# Patient Record
Sex: Male | Born: 1955 | Race: White | Hispanic: No | Marital: Married | Smoking: Never smoker
Health system: Southern US, Community
[De-identification: ages and names within clinical notes are randomized; demographics above are authoritative.]

## PROBLEM LIST (undated history)

## (undated) DIAGNOSIS — IMO0002 Reserved for concepts with insufficient information to code with codable children: Secondary | ICD-10-CM

## (undated) DIAGNOSIS — E785 Hyperlipidemia, unspecified: Secondary | ICD-10-CM

## (undated) DIAGNOSIS — E669 Obesity, unspecified: Secondary | ICD-10-CM

## (undated) DIAGNOSIS — G4730 Sleep apnea, unspecified: Secondary | ICD-10-CM

## (undated) DIAGNOSIS — E119 Type 2 diabetes mellitus without complications: Secondary | ICD-10-CM

## (undated) DIAGNOSIS — G4733 Obstructive sleep apnea (adult) (pediatric): Secondary | ICD-10-CM

## (undated) DIAGNOSIS — T7840XA Allergy, unspecified, initial encounter: Secondary | ICD-10-CM

## (undated) DIAGNOSIS — J45909 Unspecified asthma, uncomplicated: Secondary | ICD-10-CM

## (undated) DIAGNOSIS — M1990 Unspecified osteoarthritis, unspecified site: Secondary | ICD-10-CM

## (undated) DIAGNOSIS — I10 Essential (primary) hypertension: Secondary | ICD-10-CM

## (undated) HISTORY — DX: Allergy, unspecified, initial encounter: T78.40XA

## (undated) HISTORY — PX: POLYPECTOMY: SHX149

## (undated) HISTORY — DX: Obstructive sleep apnea (adult) (pediatric): G47.33

## (undated) HISTORY — DX: Sleep apnea, unspecified: G47.30

## (undated) HISTORY — PX: COLONOSCOPY: SHX174

## (undated) HISTORY — DX: Essential (primary) hypertension: I10

## (undated) HISTORY — DX: Unspecified asthma, uncomplicated: J45.909

## (undated) HISTORY — DX: Hyperlipidemia, unspecified: E78.5

## (undated) HISTORY — DX: Reserved for concepts with insufficient information to code with codable children: IMO0002

## (undated) HISTORY — DX: Obesity, unspecified: E66.9

---

## 1988-02-28 HISTORY — PX: SHOULDER SURGERY: SHX246

## 1999-02-01 ENCOUNTER — Ambulatory Visit (HOSPITAL_COMMUNITY): Admission: RE | Admit: 1999-02-01 | Discharge: 1999-02-01 | Payer: Self-pay | Admitting: Gastroenterology

## 2003-05-14 ENCOUNTER — Emergency Department (HOSPITAL_COMMUNITY): Admission: EM | Admit: 2003-05-14 | Discharge: 2003-05-14 | Payer: Self-pay | Admitting: Emergency Medicine

## 2005-10-25 ENCOUNTER — Ambulatory Visit: Payer: Self-pay | Admitting: Internal Medicine

## 2005-11-01 ENCOUNTER — Ambulatory Visit: Payer: Self-pay | Admitting: Internal Medicine

## 2005-11-29 ENCOUNTER — Ambulatory Visit: Payer: Self-pay | Admitting: Internal Medicine

## 2005-11-29 DIAGNOSIS — I10 Essential (primary) hypertension: Secondary | ICD-10-CM

## 2005-11-29 HISTORY — DX: Essential (primary) hypertension: I10

## 2006-03-07 ENCOUNTER — Encounter: Payer: Self-pay | Admitting: Internal Medicine

## 2006-03-07 ENCOUNTER — Ambulatory Visit: Payer: Self-pay | Admitting: Internal Medicine

## 2006-07-06 ENCOUNTER — Ambulatory Visit: Payer: Self-pay | Admitting: Internal Medicine

## 2006-08-15 ENCOUNTER — Ambulatory Visit: Payer: Self-pay | Admitting: Internal Medicine

## 2006-08-15 LAB — CONVERTED CEMR LAB
ALT: 26 units/L (ref 0–40)
AST: 27 units/L (ref 0–37)
Albumin: 4.6 g/dL (ref 3.5–5.2)
Alkaline Phosphatase: 33 units/L — ABNORMAL LOW (ref 39–117)
BUN: 24 mg/dL — ABNORMAL HIGH (ref 6–23)
Bilirubin, Direct: 0.1 mg/dL (ref 0.0–0.3)
CO2: 34 meq/L — ABNORMAL HIGH (ref 19–32)
Calcium: 9.8 mg/dL (ref 8.4–10.5)
Chloride: 105 meq/L (ref 96–112)
Creatinine, Ser: 1.1 mg/dL (ref 0.4–1.5)
GFR calc Af Amer: 91 mL/min
GFR calc non Af Amer: 75 mL/min
Glucose, Bld: 82 mg/dL (ref 70–99)
Potassium: 4.7 meq/L (ref 3.5–5.1)
Sodium: 146 meq/L — ABNORMAL HIGH (ref 135–145)
Total Bilirubin: 0.7 mg/dL (ref 0.3–1.2)
Total Protein: 6.8 g/dL (ref 6.0–8.3)

## 2006-10-09 ENCOUNTER — Telehealth (INDEPENDENT_AMBULATORY_CARE_PROVIDER_SITE_OTHER): Payer: Self-pay | Admitting: *Deleted

## 2006-11-14 ENCOUNTER — Ambulatory Visit: Payer: Self-pay | Admitting: Internal Medicine

## 2006-11-15 ENCOUNTER — Encounter: Payer: Self-pay | Admitting: Internal Medicine

## 2007-02-07 ENCOUNTER — Ambulatory Visit: Payer: Self-pay | Admitting: Internal Medicine

## 2007-02-07 DIAGNOSIS — E785 Hyperlipidemia, unspecified: Secondary | ICD-10-CM

## 2007-02-07 HISTORY — DX: Hyperlipidemia, unspecified: E78.5

## 2007-02-07 LAB — CONVERTED CEMR LAB
ALT: 36 units/L (ref 0–53)
AST: 40 units/L — ABNORMAL HIGH (ref 0–37)
Albumin: 3.5 g/dL (ref 3.5–5.2)
Alkaline Phosphatase: 38 units/L — ABNORMAL LOW (ref 39–117)
BUN: 16 mg/dL (ref 6–23)
Basophils Absolute: 0 10*3/uL (ref 0.0–0.1)
Basophils Relative: 0.2 % (ref 0.0–1.0)
Bilirubin, Direct: 0.1 mg/dL (ref 0.0–0.3)
CO2: 33 meq/L — ABNORMAL HIGH (ref 19–32)
Calcium: 9.3 mg/dL (ref 8.4–10.5)
Chloride: 102 meq/L (ref 96–112)
Cholesterol: 208 mg/dL (ref 0–200)
Creatinine, Ser: 0.9 mg/dL (ref 0.4–1.5)
Direct LDL: 103.5 mg/dL
Eosinophils Absolute: 0.6 10*3/uL (ref 0.0–0.6)
Eosinophils Relative: 6.6 % — ABNORMAL HIGH (ref 0.0–5.0)
GFR calc Af Amer: 115 mL/min
GFR calc non Af Amer: 95 mL/min
Glucose, Bld: 100 mg/dL — ABNORMAL HIGH (ref 70–99)
HCT: 42 % (ref 39.0–52.0)
HDL: 42.7 mg/dL (ref >39.0)
Hemoglobin: 14.4 g/dL (ref 13.0–17.0)
Lymphocytes Relative: 33.7 % (ref 12.0–46.0)
MCHC: 34.4 g/dL (ref 30.0–36.0)
MCV: 92.8 fL (ref 78.0–100.0)
Monocytes Absolute: 0.8 10*3/uL — ABNORMAL HIGH (ref 0.2–0.7)
Monocytes Relative: 9.3 % (ref 3.0–11.0)
Neutro Abs: 4.3 10*3/uL (ref 1.4–7.7)
Neutrophils Relative %: 50.2 % (ref 43.0–77.0)
Platelets: 229 10*3/uL (ref 150–400)
Potassium: 4.7 meq/L (ref 3.5–5.1)
RBC: 4.53 M/uL (ref 4.22–5.81)
RDW: 12 % (ref 11.5–14.6)
Sodium: 142 meq/L (ref 135–145)
Total Bilirubin: 0.7 mg/dL (ref 0.3–1.2)
Total CHOL/HDL Ratio: 4.9
Total Protein: 6.7 g/dL (ref 6.0–8.3)
Triglycerides: 199 mg/dL — ABNORMAL HIGH (ref 0–149)
VLDL: 40 mg/dL (ref 0–40)
WBC: 8.6 10*3/uL (ref 4.5–10.5)

## 2007-02-14 ENCOUNTER — Ambulatory Visit: Payer: Self-pay | Admitting: Internal Medicine

## 2007-04-18 ENCOUNTER — Telehealth: Payer: Self-pay | Admitting: Internal Medicine

## 2007-05-21 ENCOUNTER — Ambulatory Visit: Payer: Self-pay | Admitting: Internal Medicine

## 2007-05-21 LAB — CONVERTED CEMR LAB
Albumin: 4.1 g/dL (ref 3.5–5.2)
BUN: 20 mg/dL (ref 6–23)
Bilirubin, Direct: 0.1 mg/dL (ref 0.0–0.3)
Calcium: 9.6 mg/dL (ref 8.4–10.5)
GFR calc Af Amer: 102 mL/min
Glucose, Bld: 105 mg/dL — ABNORMAL HIGH (ref 70–99)
HDL: 35 mg/dL — ABNORMAL LOW (ref >39.0)
Sodium: 140 meq/L (ref 135–145)
Total Protein: 6.5 g/dL (ref 6.0–8.3)
Triglycerides: 457 mg/dL (ref 0–149)

## 2007-06-11 ENCOUNTER — Ambulatory Visit: Payer: Self-pay | Admitting: Internal Medicine

## 2007-06-17 ENCOUNTER — Ambulatory Visit: Payer: Self-pay | Admitting: Internal Medicine

## 2007-08-09 ENCOUNTER — Ambulatory Visit: Payer: Self-pay | Admitting: Internal Medicine

## 2007-10-01 ENCOUNTER — Ambulatory Visit: Payer: Self-pay | Admitting: Internal Medicine

## 2007-10-01 DIAGNOSIS — E669 Obesity, unspecified: Secondary | ICD-10-CM | POA: Insufficient documentation

## 2007-10-01 DIAGNOSIS — G4733 Obstructive sleep apnea (adult) (pediatric): Secondary | ICD-10-CM

## 2007-10-01 HISTORY — DX: Obstructive sleep apnea (adult) (pediatric): G47.33

## 2007-10-01 HISTORY — DX: Obesity, unspecified: E66.9

## 2007-10-02 ENCOUNTER — Encounter: Payer: Self-pay | Admitting: Internal Medicine

## 2007-10-03 LAB — CONVERTED CEMR LAB
ALT: 28 units/L (ref 0–53)
BUN: 18 mg/dL (ref 6–23)
CO2: 32 meq/L (ref 19–32)
Calcium: 9.7 mg/dL (ref 8.4–10.5)
Cholesterol: 229 mg/dL (ref 0–200)
Creatinine, Ser: 1 mg/dL (ref 0.4–1.5)
Direct LDL: 89.5 mg/dL
Glucose, Bld: 102 mg/dL — ABNORMAL HIGH (ref 70–99)
Total Protein: 7 g/dL (ref 6.0–8.3)
Triglycerides: 568 mg/dL (ref 0–149)

## 2008-01-30 ENCOUNTER — Ambulatory Visit: Payer: Self-pay | Admitting: Internal Medicine

## 2008-02-03 LAB — CONVERTED CEMR LAB
BUN: 22 mg/dL (ref 6–23)
CO2: 32 meq/L (ref 19–32)
Chloride: 105 meq/L (ref 96–112)
Cholesterol: 220 mg/dL (ref 0–200)
Creatinine, Ser: 1 mg/dL (ref 0.4–1.5)
Direct LDL: 101.9 mg/dL

## 2008-02-18 ENCOUNTER — Encounter: Payer: Self-pay | Admitting: Internal Medicine

## 2008-07-28 ENCOUNTER — Telehealth: Payer: Self-pay | Admitting: Internal Medicine

## 2008-07-31 ENCOUNTER — Encounter: Payer: Self-pay | Admitting: Internal Medicine

## 2008-08-14 ENCOUNTER — Ambulatory Visit: Payer: Self-pay | Admitting: Internal Medicine

## 2008-08-18 LAB — CONVERTED CEMR LAB
Bilirubin, Direct: 0 mg/dL (ref 0.0–0.3)
Direct LDL: 90.6 mg/dL
GFR calc non Af Amer: 94.15 mL/min (ref >60)
Glucose, Bld: 108 mg/dL — ABNORMAL HIGH (ref 70–99)
HDL: 37.8 mg/dL — ABNORMAL LOW (ref >39.00)
Potassium: 3.7 meq/L (ref 3.5–5.1)
Sodium: 142 meq/L (ref 135–145)
TSH: 0.99 microintl units/mL (ref 0.35–5.50)
Total Protein: 6.9 g/dL (ref 6.0–8.3)
Triglycerides: 235 mg/dL — ABNORMAL HIGH (ref 0.0–149.0)
VLDL: 47 mg/dL — ABNORMAL HIGH (ref 0.0–40.0)

## 2008-09-01 ENCOUNTER — Ambulatory Visit: Payer: Self-pay | Admitting: Family Medicine

## 2008-09-02 ENCOUNTER — Telehealth: Payer: Self-pay | Admitting: Family Medicine

## 2008-11-04 ENCOUNTER — Ambulatory Visit: Payer: Self-pay | Admitting: Family Medicine

## 2008-11-05 ENCOUNTER — Telehealth (INDEPENDENT_AMBULATORY_CARE_PROVIDER_SITE_OTHER): Payer: Self-pay | Admitting: *Deleted

## 2008-11-30 ENCOUNTER — Ambulatory Visit: Payer: Self-pay | Admitting: Family Medicine

## 2008-12-06 ENCOUNTER — Emergency Department (HOSPITAL_BASED_OUTPATIENT_CLINIC_OR_DEPARTMENT_OTHER): Admission: EM | Admit: 2008-12-06 | Discharge: 2008-12-06 | Payer: Self-pay | Admitting: Emergency Medicine

## 2009-01-13 ENCOUNTER — Encounter (INDEPENDENT_AMBULATORY_CARE_PROVIDER_SITE_OTHER): Payer: Self-pay | Admitting: *Deleted

## 2009-03-01 ENCOUNTER — Ambulatory Visit: Payer: Self-pay | Admitting: Family Medicine

## 2009-05-10 ENCOUNTER — Ambulatory Visit: Payer: Self-pay | Admitting: Family Medicine

## 2009-05-12 ENCOUNTER — Ambulatory Visit: Payer: Self-pay | Admitting: Family Medicine

## 2009-06-08 ENCOUNTER — Ambulatory Visit: Payer: Self-pay | Admitting: Internal Medicine

## 2009-06-10 LAB — CONVERTED CEMR LAB
Alkaline Phosphatase: 36 units/L — ABNORMAL LOW (ref 39–117)
BUN: 19 mg/dL (ref 6–23)
Basophils Absolute: 0 10*3/uL (ref 0.0–0.1)
Bilirubin, Direct: 0 mg/dL (ref 0.0–0.3)
CO2: 34 meq/L — ABNORMAL HIGH (ref 19–32)
Calcium: 9.2 mg/dL (ref 8.4–10.5)
Chloride: 102 meq/L (ref 96–112)
Cholesterol: 187 mg/dL (ref 0–200)
Creatinine, Ser: 1 mg/dL (ref 0.4–1.5)
Eosinophils Absolute: 0.3 10*3/uL (ref 0.0–0.7)
Glucose, Bld: 90 mg/dL (ref 70–99)
HDL: 56.8 mg/dL (ref >39.00)
Lymphocytes Relative: 38.4 % (ref 12.0–46.0)
MCHC: 34.5 g/dL (ref 30.0–36.0)
MCV: 94.4 fL (ref 78.0–100.0)
Monocytes Absolute: 0.5 10*3/uL (ref 0.1–1.0)
Neutrophils Relative %: 49.7 % (ref 43.0–77.0)
PSA: 1.04 ng/mL (ref 0.10–4.00)
Platelets: 164 10*3/uL (ref 150.0–400.0)
RDW: 14 % (ref 11.5–14.6)
Total CHOL/HDL Ratio: 3
Triglycerides: 150 mg/dL — ABNORMAL HIGH (ref 0.0–149.0)

## 2009-06-16 ENCOUNTER — Encounter: Payer: Self-pay | Admitting: Internal Medicine

## 2009-06-21 ENCOUNTER — Telehealth: Payer: Self-pay | Admitting: Internal Medicine

## 2009-06-24 ENCOUNTER — Encounter: Payer: Self-pay | Admitting: Internal Medicine

## 2009-07-02 ENCOUNTER — Encounter: Payer: Self-pay | Admitting: Internal Medicine

## 2009-07-08 ENCOUNTER — Encounter: Payer: Self-pay | Admitting: Internal Medicine

## 2009-07-15 ENCOUNTER — Encounter: Payer: Self-pay | Admitting: Internal Medicine

## 2009-07-29 ENCOUNTER — Encounter: Payer: Self-pay | Admitting: Internal Medicine

## 2009-08-05 ENCOUNTER — Encounter: Payer: Self-pay | Admitting: Internal Medicine

## 2009-08-25 ENCOUNTER — Encounter: Payer: Self-pay | Admitting: Internal Medicine

## 2009-09-07 ENCOUNTER — Encounter: Payer: Self-pay | Admitting: Internal Medicine

## 2009-09-07 ENCOUNTER — Ambulatory Visit: Payer: Self-pay | Admitting: Internal Medicine

## 2009-09-07 DIAGNOSIS — J45909 Unspecified asthma, uncomplicated: Secondary | ICD-10-CM

## 2009-09-07 HISTORY — DX: Unspecified asthma, uncomplicated: J45.909

## 2009-09-29 ENCOUNTER — Ambulatory Visit: Payer: Self-pay | Admitting: Family Medicine

## 2009-09-29 ENCOUNTER — Telehealth: Payer: Self-pay | Admitting: Internal Medicine

## 2009-09-30 ENCOUNTER — Telehealth: Payer: Self-pay | Admitting: Internal Medicine

## 2009-10-04 ENCOUNTER — Encounter: Payer: Self-pay | Admitting: Internal Medicine

## 2009-10-12 ENCOUNTER — Ambulatory Visit: Payer: Self-pay | Admitting: Internal Medicine

## 2009-10-28 ENCOUNTER — Encounter: Payer: Self-pay | Admitting: Internal Medicine

## 2009-11-17 ENCOUNTER — Encounter: Payer: Self-pay | Admitting: Internal Medicine

## 2009-12-20 ENCOUNTER — Ambulatory Visit: Payer: Self-pay | Admitting: Internal Medicine

## 2010-01-18 ENCOUNTER — Ambulatory Visit: Payer: Self-pay | Admitting: Internal Medicine

## 2010-03-02 ENCOUNTER — Ambulatory Visit
Admission: RE | Admit: 2010-03-02 | Discharge: 2010-03-02 | Payer: Self-pay | Source: Home / Self Care | Attending: Family Medicine | Admitting: Family Medicine

## 2010-03-29 NOTE — Consult Note (Signed)
Summary: [facility]  [facility]   Imported By: Maryln Gottron 07/07/2009 12:17:21  _____________________________________________________________________  External Attachment:    Type:   Image     Comment:   External Document

## 2010-03-29 NOTE — Assessment & Plan Note (Signed)
Summary: CPX/PT WILL COME IN FASTING/[facility] rsc bmp   Vital Signs:  Patient profile:   55 year old male Height:      72 inches Weight:      321 pounds BMI:     43.69 Pulse rate:   72 / minute Pulse rhythm:   regular Resp:     14 per minute BP sitting:   142 / 96  (left arm) Cuff size:   large  Vitals Entered By: Gladis Riffle, RN (June 08, 2009 9:06 AM)  Nutrition Counseling: Patient's BMI is greater than 25 and therefore counseled on weight management options.  Serial Vital Signs/Assessments:  Time      Position  BP       Pulse  Resp  Temp     By                     140/90                         [name] MD  CC: cpx, fasting except lozenge at 7AM Is Patient Diabetic? No   CC:  cpx and fasting except lozenge at 7AM.  History of Present Illness: cpx  weight---not following diet or exercise plan  Preventive Screening-Counseling & Management  Alcohol-Tobacco     Smoking Status: never  Current Problems (verified): 1)  Sleep Apnea, Obstructive  (ICD-327.23) 2)  Obesity  (ICD-278.00) 3)  Hyperlipidemia  (ICD-272.4) 4)  Anemia  (ICD-285.9) 5)  Hypertension  (ICD-401.9)  Current Medications (verified): 1)  Lisinopril-Hydrochlorothiazide 20-12.5 Mg  Tabs (Lisinopril-Hydrochlorothiazide) .... 2 By Mouth Once Daily 2)  Amlodipine Besylate 10 Mg  Tabs (Amlodipine Besylate) .... Once Daily 3)  Vicodin Es 7.5-750 Mg  Tabs (Hydrocodone-Acetaminophen) .Marland Kitchen.. 1 Every Four Hours As Needed 4)  Naproxen Dr 375 Mg  Tbec (Naproxen) .... Take 1 Tablet By Mouth Two Times A Day 5)  Multivitamins   Tabs (Multiple Vitamin) .... Once Daily 6)  Lidoderm 5 %  Ptch (Lidocaine) .... As Needed 7)  Carvedilol 25 Mg  Tabs (Carvedilol) .Marland Kitchen.. 1 Pill By Mouth 2 Times Daily 8)  Vitamin C 1000 Mg Tabs (Ascorbic Acid) .... Once Daily 9)  Proair Hfa 108 (90 Base) Mcg/act  Aers (Albuterol Sulfate) .... 2 Puffs 4 Times Daily As Needed For Wheezing  Allergies: 1)  ! Penicillin V Potassium (Penicillin V  Potassium) 2)  ! Adult Aspirin Low Strength (Aspirin)  Past History:  Past Medical History: Last updated: 11/29/2005 Hypertension chronic back pain obesity  Past Surgical History: Last updated: 11/29/2005 shoulder surgery  Family History: Last updated: 06/08/2009 Family History of CAD Male 1st degree relative <50 Family History Diabetes 1st degree relative brother colon CA age 14 father 24s colon CA/lung CA  Social History: Last updated: 11/14/2006 Married Never Smoked long term disability  Risk Factors: Smoking Status: never (06/08/2009)  Family History: Family History of CAD Male 1st degree relative <50 Family History Diabetes 1st degree relative brother colon CA age 76 father 34s colon CA/lung CA  Physical Exam  General:  alert and well-developed.   Head:  normocephalic and atraumatic.   Eyes:  pupils equal and pupils round.   Ears:  R ear normal and L ear normal.   Nose:  no external deformity and no external erythema.   Neck:  No deformities, masses, or tenderness noted. Lungs:  normal respiratory effort and no intercostal retractions.   Heart:  normal rate and regular rhythm.  Abdomen:  soft and non-tender.   Prostate:  no nodules and no asymmetry.   Msk:  No deformity or scoliosis noted of thoracic or lumbar spine.   Pulses:  R radial normal and L radial normal.   Extremities:  No clubbing, cyanosis, edema, or deformity noted  Neurologic:  cranial nerves II-XII intact and gait normal.   Skin:  turgor normal and color normal.   Psych:  memory intact for recent and remote and normally interactive.     Impression & Recommendations:  Problem # 1:  PREVENTIVE HEALTH CARE (ICD-V70.0)  Orders: Venipuncture ([address]) TLB-Lipid Panel (80061-LIPID) TLB-BMP (Basic Metabolic Panel-BMET) (80048-METABOL) TLB-CBC Platelet - w/Differential (85025-CBCD) TLB-Hepatic/Liver Function Pnl (80076-HEPATIC) TLB-TSH (Thyroid Stimulating Hormone) (84443-TSH) TLB-PSA  (Prostate Specific Antigen) (84153-PSA) UA Dipstick w/o Micro (automated)  (81003)  Problem # 2:  HYPERLIPIDEMIA (ICD-272.4)  Labs Reviewed: SGOT: 30 (08/14/2008)   SGPT: 34 (08/14/2008)  Prior 10 Yr Risk Heart Disease: Not enough information (03/07/2006)   HDL:37.80 (08/14/2008), 34.2 (01/30/2008)  LDL:DEL (01/30/2008), DEL (10/01/2007)  Chol:172 (08/14/2008), 220 (01/30/2008)  Trig:235.0 (08/14/2008), 437 (01/30/2008)  Problem # 3:  ANEMIA (ICD-285.9) resolved recheck today Hgb: 14.4 (02/07/2007)   Hct: 42.0 (02/07/2007)   Platelets: 229 (02/07/2007) RBC: 4.53 (02/07/2007)   RDW: 12.0 (02/07/2007)   WBC: 8.6 (02/07/2007) MCV: 92.8 (02/07/2007)   MCHC: 34.4 (02/07/2007) TSH: 0.99 (08/14/2008)  Complete Medication List: 1)  Lisinopril-hydrochlorothiazide 20-12.5 Mg Tabs (Lisinopril-hydrochlorothiazide) .... 2 by mouth once daily 2)  Amlodipine Besylate 10 Mg Tabs (Amlodipine besylate) .... Once daily 3)  Vicodin Es 7.5-750 Mg Tabs (Hydrocodone-acetaminophen) .Marland Kitchen.. 1 every four hours as needed 4)  Naproxen Dr 375 Mg Tbec (Naproxen) .... Take 1 tablet by mouth two times a day 5)  Multivitamins Tabs (Multiple vitamin) .... Once daily 6)  Lidoderm 5 % Ptch (Lidocaine) .... As needed 7)  Carvedilol 25 Mg Tabs (Carvedilol) .Marland Kitchen.. 1 pill by mouth 2 times daily 8)  Vitamin C 1000 Mg Tabs (Ascorbic acid) .... Once daily 9)  Proair Hfa 108 (90 Base) Mcg/act Aers (Albuterol sulfate) .... 2 puffs 4 times daily as needed for wheezing  Other Orders: Tdap => 54yrs IM (60[address]) Admin 1st Vaccine (09811)   Patient Instructions: 1)  Please schedule a follow-up appointment in 4 months. 2)  It is important that you exercise regularly at least 20 minutes 5 times a week. If you develop chest pain, have severe difficulty breathing, or feel very tired , stop exercising immediately and seek medical attention. 3)  You need to lose weight. Consider a lower calorie diet and regular exercise.    Immunizations  Administered:  Tetanus Vaccine:    Vaccine Type: Tdap    Site: left deltoid    Mfr: Gl[employer]oSmithKline    Dose: 0.5 ml    Route: IM    Given by: Gladis Riffle, RN    Exp. Date: 05/22/2011    Lot #: [id number]      Appended Document: Orders Update    Clinical Lists Changes  Observations: Added new observation of PH URINE: 7.0  (06/08/2009 12:29) Added new observation of SPEC GR URIN: 1.020  (06/08/2009 12:29) Added new observation of APPEARANCE U: Clear  (06/08/2009 12:29) Added new observation of UA COLOR: yellow  (06/08/2009 12:29) Added new observation of WBC DIPSTK U: negative  (06/08/2009 12:29) Added new observation of NITRITE URN: negative  (06/08/2009 12:29) Added new observation of UROBILINOGEN: negative  (06/08/2009 12:29) Added new observation of PROTEIN, URN: negative  (06/08/2009 12:29) Added new observation  of BLOOD UR DIP: negative  (06/08/2009 12:29) Added new observation of KETONES URN: negative  (06/08/2009 12:29) Added new observation of BILIRUBIN UR: negative  (06/08/2009 12:29) Added new observation of GLUCOSE, URN: negative  (06/08/2009 12:29) Added new observation of COMMENTS: Wynona Canes, CMA  June 08, 2009 12:30 PM  (06/08/2009 12:29)      Laboratory Results   Urine Tests  Date/Time Recieved: June 08, 2009 12:30 PM Date/Time Reported: June 08, 2009 12:30 PM  Routine Urinalysis   Color: yellow Appearance: Clear Glucose: negative   (Normal Range: Negative) Bilirubin: negative   (Normal Range: Negative) Ketone: negative   (Normal Range: Negative) Spec. Gravity: 1.020   (Normal Range: 1.003-1.035) Blood: negative   (Normal Range: Negative) pH: 7.0   (Normal Range: 5.0-8.0) Protein: negative   (Normal Range: Negative) Urobilinogen: negative   (Normal Range: 0-1) Nitrite: negative   (Normal Range: Negative) Leukocyte Esterace: negative   (Normal Range: Negative)    Comments: Wynona Canes, CMA  June 08, 2009 12:30 PM

## 2010-03-29 NOTE — Miscellaneous (Signed)
Summary: Orders Update  Clinical Lists Changes  Problems: Added new problem of BURSITIS, ELBOW (ICD-726.33) Orders: Added new Referral order of Orthopedic Referral (Ortho) - Signed

## 2010-03-29 NOTE — Consult Note (Signed)
Summary: [facility]  [facility]   Imported By: Maryln Gottron 09/02/2009 09:59:02  _____________________________________________________________________  External Attachment:    Type:   Image     Comment:   External Document

## 2010-03-29 NOTE — Assessment & Plan Note (Signed)
Summary: PERSISTANT PAIN IN ELBOW / EDEMA // RS   Vital Signs:  Patient profile:   55 year old male Temp:     98.4 degrees F oral BP sitting:   120 / 82  (left arm) Cuff size:   large  Vitals Entered By: Raechel Ache, RN (May 12, 2009 11:13 AM) CC: R elbow feels worse and taking abx, ice and Naprosyn.  Leaving for [place] tomorrow.   History of Present Illness: Here for persistent pain in the right elbow. He woke up with this 4 days ago. We saw him 2 days ago and felt it could represent a cellulitis. He was started on Doxycycline and ice packs and Naproxen. Today is about the same and is definitely  not better. Still no fever or systemic symptoms. The elbow is still swollen and quite painful.   Allergies: 1)  ! Penicillin V Potassium (Penicillin V Potassium) 2)  ! Adult Aspirin Low Strength (Aspirin)  Past History:  Past Medical History: Reviewed history from 11/29/2005 and no changes required. Hypertension chronic back pain obesity  Review of Systems  The patient denies anorexia, fever, weight loss, weight gain, vision loss, decreased hearing, hoarseness, chest pain, syncope, dyspnea on exertion, peripheral edema, prolonged cough, headaches, hemoptysis, abdominal pain, melena, hematochezia, severe indigestion/heartburn, hematuria, incontinence, genital sores, muscle weakness, suspicious skin lesions, transient blindness, difficulty walking, depression, unusual weight change, abnormal bleeding, enlarged lymph nodes, angioedema, breast masses, and testicular masses.    Physical Exam  General:  Well-developed,well-nourished,in no acute distress; alert,appropriate and cooperative throughout examination Extremities:  the right elbow is much the same as the other day, it is red and swollen and quite tender over the olecranon. Full ROM   Impression & Recommendations:  Problem # 1:  ELBOW PAIN (ZOX-096.04)  Orders: Depo- Medrol 80mg  (J1040) Admin of Therapeutic Inj   intramuscular or subcutaneous (54098)  Complete Medication List: 1)  Lisinopril-hydrochlorothiazide 20-12.5 Mg Tabs (Lisinopril-hydrochlorothiazide) .... 2 by mouth once daily 2)  Amlodipine Besylate 10 Mg Tabs (Amlodipine besylate) .... Once daily 3)  Vicodin Es 7.5-750 Mg Tabs (Hydrocodone-acetaminophen) .Marland Kitchen.. 1 every four hours as needed 4)  Naproxen Dr 375 Mg Tbec (Naproxen) .... Take 1 tablet by mouth two times a day 5)  Multivitamins Tabs (Multiple vitamin) .... Once daily 6)  Lidoderm 5 % Ptch (Lidocaine) .... As needed 7)  Carvedilol 25 Mg Tabs (Carvedilol) .Marland Kitchen.. 1 pill by mouth 2 times daily 8)  Vitamin C 1000 Mg Tabs (Ascorbic acid) .... Once daily 9)  Proair Hfa 108 (90 Base) Mcg/act Aers (Albuterol sulfate) .... 2 puffs 4 times daily as needed for wheezing 10)  Doxycycline Hyclate 100 Mg Caps (Doxycycline hyclate) .... Two times a day 11)  Prednisone (pak) 10 Mg Tabs (Prednisone) .... As directed for 12 days  Patient Instructions: 1)  Now it seems more likely that this is gout. Given a steroid injection today to follow with a prednisone dose pack.  2)  Please schedule a follow-up appointment as needed .  Prescriptions: PREDNISONE (PAK) 10 MG TABS (PREDNISONE) as directed for 12 days  #1 x 0   Entered and Authorized by:   Nelwyn Salisbury MD   Signed by:   Nelwyn Salisbury MD on 05/12/2009   Method used:   Electronically to        [facility]  [address] (retail)       [address]       [place], [place]  [address]  Ph: [phone]       Fax: [phone]   RxID:   [id number]    Medication Administration  Injection # 1:    Medication: Depo- Medrol 80mg     Diagnosis: ELBOW PAIN (GEX-528.41)    Route: IM    Site: R deltoid    Exp Date: 12/2011    Lot #: [id number]    Mfr: [employer]    Comments: 160mg  given    Patient tolerated injection without complications    Given by: Raechel Ache, RN (May 12, 2009 11:56 AM)  Orders Added: 1)  Est. Patient Level III [32440] 2)   Depo- Medrol 80mg  [J1040] 3)  Admin of Therapeutic Inj  intramuscular or subcutaneous [10272]

## 2010-03-29 NOTE — Consult Note (Signed)
Summary: [facility]  [facility]   Imported By: Maryln Gottron 07/01/2009 15:31:04  _____________________________________________________________________  External Attachment:    Type:   Image     Comment:   External Document

## 2010-03-29 NOTE — Letter (Signed)
Summary: [facility]  [facility]   Imported By: Maryln Gottron 10/26/2009 13:43:48  _____________________________________________________________________  External Attachment:    Type:   Image     Comment:   External Document

## 2010-03-29 NOTE — Assessment & Plan Note (Signed)
Summary: RT ELBOW SWOLLEN/RED/TENDER TO TOUCH/[facility]   Vital Signs:  Patient profile:   55 year old male Weight:      322 pounds BMI:     43.83 Temp:     98.3 degrees F oral BP sitting:   92 / 70  (left arm) Cuff size:   large  Vitals Entered By: Raechel Ache, RN (May 10, 2009 11:29 AM) CC: C/o red, painful and swollen R elbow.   History of Present Illness: He woke up yesterday am with a painful swollen right elbow, and it is about the same today. No hx of trauma that he knows of. No fever. He feels a bit weak and lightheaded today. No nausea.   Allergies: 1)  ! Penicillin V Potassium (Penicillin V Potassium) 2)  ! Adult Aspirin Low Strength (Aspirin)  Past History:  Past Medical History: Reviewed history from 11/29/2005 and no changes required. Hypertension chronic back pain obesity  Review of Systems  The patient denies anorexia, fever, weight loss, weight gain, vision loss, decreased hearing, hoarseness, chest pain, syncope, dyspnea on exertion, peripheral edema, prolonged cough, headaches, hemoptysis, abdominal pain, melena, hematochezia, severe indigestion/heartburn, hematuria, incontinence, genital sores, muscle weakness, suspicious skin lesions, transient blindness, difficulty walking, depression, unusual weight change, abnormal bleeding, enlarged lymph nodes, angioedema, breast masses, and testicular masses.    Physical Exam  General:  Well-developed,well-nourished,in no acute distress; alert,appropriate and cooperative throughout examination Extremities:  the left elbow is quite tender over the olecranon, with erythema, swelling, and warmth. Full ROM.    Impression & Recommendations:  Problem # 1:  CELLULITIS, ARM (ICD-682.3)  His updated medication list for this problem includes:    Doxycycline Hyclate 100 Mg Caps (Doxycycline hyclate) .Marland Kitchen..Marland Kitchen Two times a day  Complete Medication List: 1)  Lisinopril-hydrochlorothiazide 20-12.5 Mg Tabs  (Lisinopril-hydrochlorothiazide) .... 2 by mouth once daily 2)  Amlodipine Besylate 10 Mg Tabs (Amlodipine besylate) .... Once daily 3)  Vicodin Es 7.5-750 Mg Tabs (Hydrocodone-acetaminophen) .Marland Kitchen.. 1 every four hours as needed 4)  Naproxen Dr 375 Mg Tbec (Naproxen) .... Take 1 tablet by mouth two times a day 5)  Multivitamins Tabs (Multiple vitamin) .... Once daily 6)  Lidoderm 5 % Ptch (Lidocaine) .... As needed 7)  Carvedilol 25 Mg Tabs (Carvedilol) .Marland Kitchen.. 1 pill by mouth 2 times daily 8)  Vitamin C 1000 Mg Tabs (Ascorbic acid) .... Once daily 9)  Proair Hfa 108 (90 Base) Mcg/act Aers (Albuterol sulfate) .... 2 puffs 4 times daily as needed for wheezing 10)  Doxycycline Hyclate 100 Mg Caps (Doxycycline hyclate) .... Two times a day  Patient Instructions: 1)  use ice packs several times a day. Start Doxycycline.  2)  Please schedule a follow-up appointment as needed .  Prescriptions: DOXYCYCLINE HYCLATE 100 MG CAPS (DOXYCYCLINE HYCLATE) two times a day  #20 x 0   Entered and Authorized by:   Nelwyn Salisbury MD   Signed by:   Nelwyn Salisbury MD on 05/10/2009   Method used:   Electronically to        [facility]  [address] (retail)       [address]       [place], [place]  [address]       Ph: [phone]       Fax: [phone]   RxID:   [id number]

## 2010-03-29 NOTE — Assessment & Plan Note (Signed)
Summary: COUGH, CONGESTION // RS   Vital Signs:  Patient profile:   55 year old male Height:      72 inches Weight:      314 pounds BMI:     42.74 O2 Sat:      95 % on Room air Temp:     98.4 degrees F oral Pulse rate:   98 / minute BP sitting:   124 / 84  (left arm) Cuff size:   large  Vitals Entered By: Alfred Levins, CMA (March 01, 2009 11:33 AM)  O2 Flow:  Room air CC: cough, wheezing x1 wk   History of Present Illness: Here for one week of dry cough, wheezing, and chest congestion. No fever. Using his rescue inhaler and Hydromet syrup.  Current Medications (verified): 1)  Lisinopril-Hydrochlorothiazide 20-12.5 Mg  Tabs (Lisinopril-Hydrochlorothiazide) .... 2 By Mouth Once Daily 2)  Amlodipine Besylate 10 Mg  Tabs (Amlodipine Besylate) .... Once Daily 3)  Vicodin Es 7.5-750 Mg  Tabs (Hydrocodone-Acetaminophen) .Marland Kitchen.. 1 Every Four Hours As Needed 4)  Naproxen Dr 375 Mg  Tbec (Naproxen) .... Take 1 Tablet By Mouth Two Times A Day 5)  Multivitamins   Tabs (Multiple Vitamin) .... Once Daily 6)  Lidoderm 5 %  Ptch (Lidocaine) .... As Needed 7)  Carvedilol 25 Mg  Tabs (Carvedilol) .Marland Kitchen.. 1 Pill By Mouth 2 Times Daily 8)  Vitamin C 1000 Mg Tabs (Ascorbic Acid) .... Once Daily 9)  Proair Hfa 108 (90 Base) Mcg/act  Aers (Albuterol Sulfate) .... 2 Puffs 4 Times Daily As Needed For Wheezing  Allergies (verified): 1)  ! Penicillin V Potassium (Penicillin V Potassium) 2)  ! Adult Aspirin Low Strength (Aspirin)  Past History:  Past Medical History: Reviewed history from 11/29/2005 and no changes required. Hypertension chronic back pain obesity  Review of Systems  The patient denies anorexia, fever, weight loss, weight gain, vision loss, decreased hearing, hoarseness, chest pain, syncope, peripheral edema, headaches, hemoptysis, abdominal pain, melena, hematochezia, severe indigestion/heartburn, hematuria, incontinence, genital sores, muscle weakness, suspicious skin lesions,  transient blindness, difficulty walking, depression, unusual weight change, abnormal bleeding, enlarged lymph nodes, angioedema, breast masses, and testicular masses.    Physical Exam  General:  Well-developed,well-nourished,in no acute distress; alert,appropriate and cooperative throughout examination Head:  Normocephalic and atraumatic without obvious abnormalities. No apparent alopecia or balding. Eyes:  No corneal or conjunctival inflammation noted. EOMI. Perrla. Funduscopic exam benign, without hemorrhages, exudates or papilledema. Vision grossly normal. Ears:  External ear exam shows no significant lesions or deformities.  Otoscopic examination reveals clear canals, tympanic membranes are intact bilaterally without bulging, retraction, inflammation or discharge. Hearing is grossly normal bilaterally. Nose:  External nasal examination shows no deformity or inflammation. Nasal mucosa are pink and moist without lesions or exudates. Mouth:  Oral mucosa and oropharynx without lesions or exudates.  Teeth in good repair. Neck:  No deformities, masses, or tenderness noted. Lungs:  scattered wheezes and rhonchi, no rales   Impression & Recommendations:  Problem # 1:  ACUTE BRONCHITIS (ICD-466.0)  The following medications were removed from the medication list:    Hydromet 5-1.5 Mg/52ml Syrp (Hydrocodone-homatropine) ..... One tsp every 4-6 hours as needed for cough His updated medication list for this problem includes:    Proair Hfa 108 (90 Base) Mcg/act Aers (Albuterol sulfate) .Marland Kitchen... 2 puffs 4 times daily as needed for wheezing    Zithromax Z-pak 250 Mg Tabs (Azithromycin) .Marland Kitchen... As directed  Orders: Nebulizer Tx ([address])  Complete Medication List: 1)  Lisinopril-hydrochlorothiazide 20-12.5 Mg Tabs (Lisinopril-hydrochlorothiazide) .... 2 by mouth once daily 2)  Amlodipine Besylate 10 Mg Tabs (Amlodipine besylate) .... Once daily 3)  Vicodin Es 7.5-750 Mg Tabs (Hydrocodone-acetaminophen) .Marland Kitchen..  1 every four hours as needed 4)  Naproxen Dr 375 Mg Tbec (Naproxen) .... Take 1 tablet by mouth two times a day 5)  Multivitamins Tabs (Multiple vitamin) .... Once daily 6)  Lidoderm 5 % Ptch (Lidocaine) .... As needed 7)  Carvedilol 25 Mg Tabs (Carvedilol) .Marland Kitchen.. 1 pill by mouth 2 times daily 8)  Vitamin C 1000 Mg Tabs (Ascorbic acid) .... Once daily 9)  Proair Hfa 108 (90 Base) Mcg/act Aers (Albuterol sulfate) .... 2 puffs 4 times daily as needed for wheezing 10)  Zithromax Z-pak 250 Mg Tabs (Azithromycin) .... As directed 11)  Prednisone (pak) 10 Mg Tabs (Prednisone) .... As directed for 12 days  Patient Instructions: 1)  Please schedule a follow-up appointment as needed .  Prescriptions: PREDNISONE (PAK) 10 MG TABS (PREDNISONE) as directed for 12 days  #1 x 0   Entered and Authorized by:   Nelwyn Salisbury MD   Signed by:   Nelwyn Salisbury MD on 03/01/2009   Method used:   Electronically to        [facility]  [address] (retail)       [address]       [place], [place]  [address]       Ph: [phone]       Fax: [phone]   RxID:   [id number] ZITHROMAX Z-PAK 250 MG TABS (AZITHROMYCIN) as directed  #1 x 0   Entered and Authorized by:   Nelwyn Salisbury MD   Signed by:   Nelwyn Salisbury MD on 03/01/2009   Method used:   Electronically to        [facility]  [address] (retail)       [address]       [place], [place]  [address]       Ph: [phone]       Fax: [phone]   RxID:   [id number]

## 2010-03-29 NOTE — Letter (Signed)
Summary: [facility]  [facility]   Imported By: Maryln Gottron 08/11/2009 13:22:46  _____________________________________________________________________  External Attachment:    Type:   Image     Comment:   External Document

## 2010-03-29 NOTE — Letter (Signed)
Summary: [facility]  [facility]   Imported By: Maryln Gottron 08/11/2009 13:21:25  _____________________________________________________________________  External Attachment:    Type:   Image     Comment:   External Document

## 2010-03-29 NOTE — Assessment & Plan Note (Signed)
Summary: CHEST CONGESTION/NJR   Vital Signs:  Patient profile:   55 year old male Weight:      322 pounds Temp:     98.1 degrees F oral BP sitting:   142 / 92  (left arm) Cuff size:   large  Vitals Entered By: Alfred Levins, CMA (December 20, 2009 10:29 AM) CC: cough, congestion x2wks, mucinex no help   CC:  cough, congestion x2wks, and mucinex no help.  History of Present Illness: chest congestion 2 weeks hx of asthma no sob severe cough at times---especially nocturnal   Current Medications (verified): 1)  Lisinopril-Hydrochlorothiazide 20-12.5 Mg  Tabs (Lisinopril-Hydrochlorothiazide) .... 2 By Mouth Once Daily 2)  Amlodipine Besylate 10 Mg  Tabs (Amlodipine Besylate) .... Once Daily 3)  Naproxen Dr 375 Mg  Tbec (Naproxen) .... Take 1 Tablet By Mouth Two Times A Day 4)  Multivitamins   Tabs (Multiple Vitamin) .... Once Daily 5)  Carvedilol 25 Mg  Tabs (Carvedilol) .Marland Kitchen.. 1 Pill By Mouth 2 Times Daily 6)  Proair Hfa 108 (90 Base) Mcg/act  Aers (Albuterol Sulfate) .... 2 Puffs 4 Times Daily As Needed For Wheezing  Allergies (verified): 1)  ! Penicillin V Potassium (Penicillin V Potassium) 2)  ! Adult Aspirin Low Strength (Aspirin)   Past History:  Past Medical History: Last updated: 11/29/2005 Hypertension chronic back pain obesity  Past Surgical History: Last updated: 11/29/2005 shoulder surgery  Family History: Last updated: 06/08/2009 Family History of CAD Male 1st degree relative <50 Family History Diabetes 1st degree relative brother colon CA age 27 father 50s colon CA/lung CA  Social History: Last updated: 11/14/2006 Married Never Smoked long term disability  Risk Factors: Smoking Status: never (10/12/2009)  Physical Exam  General:  Overweight male in no acute distress. HEENT exam atraumatic, normocephalic symmetric her muscles are intact. Neck is supple. Chest no increased work of breathing. No fremitus no dullness to percussion. He does have  bilateral wheezing. Cardiac exam S1-S2 are regular. Abdominal exam obese, to bowel sounds, soft her extremities is no clubbing cyanosis or edema    Impression & Recommendations:  Problem # 1:  ASTHMA (ICD-493.90) he has a prednisone pack at home he will start. This is ten-day prednisone Paxil, back with the strength. And then change his inhalers. In a start him off on a combination inhaler containing steroids. He understands the instructions.  He will call for worsening symptoms. He understands the instructions. His updated medication list for this problem includes:    Ventolin Hfa 108 (90 Base) Mcg/act Aers (Albuterol sulfate) .Marland Kitchen... 2 puffs every 4 hours as needed wheezing    Dulera 100-5 Mcg/act Aero (Mometasone furo-formoterol fum) .Marland Kitchen... 2 puffs two times a day  Complete Medication List: 1)  Lisinopril-hydrochlorothiazide 20-12.5 Mg Tabs (Lisinopril-hydrochlorothiazide) .... 2 by mouth once daily 2)  Amlodipine Besylate 10 Mg Tabs (Amlodipine besylate) .... Once daily 3)  Naproxen Dr 375 Mg Tbec (Naproxen) .... Take 1 tablet by mouth two times a day 4)  Multivitamins Tabs (Multiple vitamin) .... Once daily 5)  Carvedilol 25 Mg Tabs (Carvedilol) .Marland Kitchen.. 1 pill by mouth 2 times daily 6)  Ventolin Hfa 108 (90 Base) Mcg/act Aers (Albuterol sulfate) .... 2 puffs every 4 hours as needed wheezing 7)  Dulera 100-5 Mcg/act Aero (Mometasone furo-formoterol fum) .... 2 puffs two times a day  Patient Instructions: 1)  see me 3-4 weeks  Prescriptions: VENTOLIN HFA 108 (90 BASE) MCG/ACT AERS (ALBUTEROL SULFATE) 2 puffs every 4 hours as needed wheezing  #1  x 1   Entered and Authorized by:   [name] MD   Signed by:   [name] MD on 12/20/2009   Method used:   Electronically to        [facility] (retail)       [address]       [place], [place]  [address]       Ph: [phone]       Fax: [phone]   RxID:   [id number]    Orders Added: 1)  Est. Patient Level IV [27253]

## 2010-03-29 NOTE — Assessment & Plan Note (Signed)
Summary: 4 month rov/njr   Vital Signs:  Patient profile:   55 year old male Weight:      320 pounds Temp:     99.2 degrees F oral BP sitting:   150 / 92  (left arm) Cuff size:   large  Vitals Entered By: Kathrynn Speed CMA (October 12, 2009 9:34 AM) CC: 4 month rov,  Is Patient Diabetic? No   CC:  4 month rov and .  History of Present Illness:  Follow-Up Visit      This is a 55 year old man who presents for Follow-up visit.  The patient denies chest pain and palpitations.  Since the last visit the patient notes no new problems or concerns.  The patient reports taking meds as prescribed.  When questioned about possible medication side effects, the patient notes none.   All other systems reviewed and were negative   Preventive Screening-Counseling & Management  Alcohol-Tobacco     Smoking Status: never  Current Problems (verified): 1)  Asthma  (ICD-493.90) 2)  Preventive Health Care  (ICD-V70.0) 3)  Sleep Apnea, Obstructive  (ICD-327.23) 4)  Obesity  (ICD-278.00) 5)  Hyperlipidemia  (ICD-272.4) 6)  Anemia  (ICD-285.9) 7)  Hypertension  (ICD-401.9)  Current Medications (verified): 1)  Lisinopril-Hydrochlorothiazide 20-12.5 Mg  Tabs (Lisinopril-Hydrochlorothiazide) .... 2 By Mouth Once Daily 2)  Amlodipine Besylate 10 Mg  Tabs (Amlodipine Besylate) .... Once Daily 3)  Naproxen Dr 375 Mg  Tbec (Naproxen) .... Take 1 Tablet By Mouth Two Times A Day 4)  Multivitamins   Tabs (Multiple Vitamin) .... Once Daily 5)  Carvedilol 25 Mg  Tabs (Carvedilol) .Marland Kitchen.. 1 Pill By Mouth 2 Times Daily 6)  Vitamin C 1000 Mg Tabs (Ascorbic Acid) .... Once Daily 7)  Proair Hfa 108 (90 Base) Mcg/act  Aers (Albuterol Sulfate) .... 2 Puffs 4 Times Daily As Needed For Wheezing  Allergies (verified): 1)  ! Penicillin V Potassium (Penicillin V Potassium) 2)  ! Adult Aspirin Low Strength (Aspirin)  Past History:  Past Medical History: Last updated: 11/29/2005 Hypertension chronic back  pain obesity  Past Surgical History: Last updated: 11/29/2005 shoulder surgery  Family History: Last updated: 06/08/2009 Family History of CAD Male 1st degree relative <50 Family History Diabetes 1st degree relative brother colon CA age 59 father 58s colon CA/lung CA  Social History: Last updated: 11/14/2006 Married Never Smoked long term disability  Risk Factors: Smoking Status: never (10/12/2009)  Physical Exam  General:  Well-developed,well-nourished,in no acute distress; alert,appropriate and cooperative throughout examination Head:  normocephalic and atraumatic.   Eyes:  pupils equal and pupils round.   Ears:  R ear normal and L ear normal.   Neck:  No deformities, masses, or tenderness noted. Chest Wall:  No deformities, masses, tenderness or gynecomastia noted. Lungs:  no intercostal retractions and no accessory muscle use.   Heart:  normal rate and regular rhythm.   Abdomen:  soft and non-tender.   Msk:  No deformity or scoliosis noted of thoracic or lumbar spine.   Neurologic:  cranial nerves II-XII intact and gait normal.   Skin:  turgor normal and color normal.   Psych:  normally interactive and good eye contact.     Impression & Recommendations:  Problem # 1:  OBESITY (ICD-278.00) desperately needs to lose weight  advised exercise and diet  Problem # 2:  HYPERLIPIDEMIA (ICD-272.4) no meds and ldl ok Labs Reviewed: SGOT: 19 (06/08/2009)   SGPT: 25 (06/08/2009)  Prior 10 Yr Risk Heart  Disease: Not enough information (03/07/2006)   HDL:56.80 (06/08/2009), 37.80 (08/14/2008)  LDL:100 (06/08/2009), DEL (01/30/2008)  Chol:187 (06/08/2009), 172 (08/14/2008)  Trig:150.0 (06/08/2009), 235.0 (08/14/2008)  Problem # 3:  HYPERTENSION (ICD-401.9) has not taken meds today advised His updated medication list for this problem includes:    Lisinopril-hydrochlorothiazide 20-12.5 Mg Tabs (Lisinopril-hydrochlorothiazide) .Marland Kitchen... 2 by mouth once daily    Amlodipine  Besylate 10 Mg Tabs (Amlodipine besylate) ..... Once daily    Carvedilol 25 Mg Tabs (Carvedilol) .Marland Kitchen... 1 pill by mouth 2 times daily  BP today: 150/92 Prior BP: 140/90 (09/29/2009)  Prior 10 Yr Risk Heart Disease: Not enough information (03/07/2006)  Labs Reviewed: K+: 3.8 (06/08/2009) Creat: : 1.0 (06/08/2009)   Chol: 187 (06/08/2009)   HDL: 56.80 (06/08/2009)   LDL: 100 (06/08/2009)   TG: 150.0 (06/08/2009)  Problem # 4:  SLEEP APNEA, OBSTRUCTIVE (ICD-327.23) using CPAP  Complete Medication List: 1)  Lisinopril-hydrochlorothiazide 20-12.5 Mg Tabs (Lisinopril-hydrochlorothiazide) .... 2 by mouth once daily 2)  Amlodipine Besylate 10 Mg Tabs (Amlodipine besylate) .... Once daily 3)  Naproxen Dr 375 Mg Tbec (Naproxen) .... Take 1 tablet by mouth two times a day 4)  Multivitamins Tabs (Multiple vitamin) .... Once daily 5)  Carvedilol 25 Mg Tabs (Carvedilol) .Marland Kitchen.. 1 pill by mouth 2 times daily 6)  Vitamin C 1000 Mg Tabs (Ascorbic acid) .... Once daily 7)  Proair Hfa 108 (90 Base) Mcg/act Aers (Albuterol sulfate) .... 2 puffs 4 times daily as needed for wheezing  Other Orders: Gastroenterology Referral (GI)  Patient Instructions: 1)  Please schedule a follow-up appointment in 4 months.

## 2010-03-29 NOTE — Assessment & Plan Note (Signed)
Summary: 4 month fup//ccm   Vital Signs:  Patient profile:   55 year old male Weight:      323 pounds Temp:     98.8 degrees F oral Pulse rate:   84 / minute Pulse rhythm:   regular BP sitting:   132 / 84  (left arm) Cuff size:   large  Vitals Entered By: Alfred Levins, CMA (January 18, 2010 10:37 AM) CC: f/u   CC:  f/u.  History of Present Illness: Obesity---has not tried to lose weight  HTN---tolerating meds without difficulty  asthma---doing well on meds  All other systems reviewed and were negative   Current Medications (verified): 1)  Lisinopril-Hydrochlorothiazide 20-12.5 Mg  Tabs (Lisinopril-Hydrochlorothiazide) .... 2 By Mouth Once Daily 2)  Amlodipine Besylate 10 Mg  Tabs (Amlodipine Besylate) .... Once Daily 3)  Naproxen Dr 375 Mg  Tbec (Naproxen) .... Take 1 Tablet By Mouth Two Times A Day 4)  Multivitamins   Tabs (Multiple Vitamin) .... Once Daily 5)  Carvedilol 25 Mg  Tabs (Carvedilol) .Marland Kitchen.. 1 Pill By Mouth 2 Times Daily 6)  Ventolin Hfa 108 (90 Base) Mcg/act Aers (Albuterol Sulfate) .... 2 Puffs Every 4 Hours As Needed Wheezing 7)  Dulera 100-5 Mcg/act Aero (Mometasone Furo-Formoterol Fum) .... 2 Puffs Two Times A Day  Allergies (verified): 1)  ! Penicillin V Potassium (Penicillin V Potassium) 2)  ! Adult Aspirin Low Strength (Aspirin)  Physical Exam  General:  overweight male in no acute distress. HEENT exam atraumatic, normocephalic, neck supple without lymphadenopathy chest clear to auscultation without increased work of breathing. Cardiac exam S1-S2 are regular   Impression & Recommendations:  Problem # 1:  ASTHMA (ICD-493.90) symptoms are well controlled. He will try to decreased Dulera to 2 puffs once daily His updated medication list for this problem includes:    Ventolin Hfa 108 (90 Base) Mcg/act Aers (Albuterol sulfate) .Marland Kitchen... 2 puffs every 4 hours as needed wheezing    Dulera 100-5 Mcg/act Aero (Mometasone furo-formoterol fum) .Marland Kitchen...  2 puffs two times a day  Problem # 2:  OBESITY (ICD-278.00) discussed need for aggressive weight loss.  Problem # 3:  HYPERLIPIDEMIA (ICD-272.4)  Labs Reviewed: SGOT: 19 (06/08/2009)   SGPT: 25 (06/08/2009)  Prior 10 Yr Risk Heart Disease: Not enough information (03/07/2006)   HDL:56.80 (06/08/2009), 37.80 (08/14/2008)  LDL:100 (06/08/2009), DEL (01/30/2008)  Chol:187 (06/08/2009), 172 (08/14/2008)  Trig:150.0 (06/08/2009), 235.0 (08/14/2008)  Problem # 4:  ANEMIA (ICD-285.9) resolved Hgb: 13.6 (06/08/2009)   Hct: 39.4 (06/08/2009)   Platelets: 164.0 (06/08/2009) RBC: 4.17 (06/08/2009)   RDW: 14.0 (06/08/2009)   WBC: 6.9 (06/08/2009) MCV: 94.4 (06/08/2009)   MCHC: 34.5 (06/08/2009) TSH: 1.74 (06/08/2009)  Complete Medication List: 1)  Lisinopril-hydrochlorothiazide 20-12.5 Mg Tabs (Lisinopril-hydrochlorothiazide) .... 2 by mouth once daily 2)  Amlodipine Besylate 10 Mg Tabs (Amlodipine besylate) .... Once daily 3)  Naproxen Dr 375 Mg Tbec (Naproxen) .... Take 1 tablet by mouth two times a day 4)  Multivitamins Tabs (Multiple vitamin) .... Once daily 5)  Carvedilol 25 Mg Tabs (Carvedilol) .Marland Kitchen.. 1 pill by mouth 2 times daily 6)  Ventolin Hfa 108 (90 Base) Mcg/act Aers (Albuterol sulfate) .... 2 puffs every 4 hours as needed wheezing 7)  Dulera 100-5 Mcg/act Aero (Mometasone furo-formoterol fum) .... 2 puffs two times a day  Patient Instructions: 1)  Please schedule a follow-up appointment in 3 months.   Orders Added: 1)  Est. Patient Level III [95284]

## 2010-03-29 NOTE — Assessment & Plan Note (Signed)
Summary: wheezing/sob/dm   Vital Signs:  Patient profile:   55 year old male Weight:      328 pounds BMI:     44.65 O2 Sat:      93 % Temp:     98.4 degrees F oral BP sitting:   140 / 90  (left arm) Cuff size:   large  Vitals Entered By: Raechel Ache, RN (September 29, 2009 4:09 PM) CC: C/o congestion, cough, wheezing, sinus drainage.   History of Present Illness: Here for 4 weeks of chest tightness, mild SOB, wheezing, and coughing up clear sputum. No chest pain or fever. Was seen here a few weeks ago, and was given 5 days of Prednisone. This helped for a few days, but then he got tight again.   Allergies: 1)  ! Penicillin V Potassium (Penicillin V Potassium) 2)  ! Adult Aspirin Low Strength (Aspirin)  Past History:  Past Medical History: Reviewed history from 11/29/2005 and no changes required. Hypertension chronic back pain obesity  Review of Systems  The patient denies anorexia, fever, weight loss, weight gain, vision loss, decreased hearing, hoarseness, chest pain, syncope, dyspnea on exertion, peripheral edema, headaches, hemoptysis, abdominal pain, melena, hematochezia, severe indigestion/heartburn, hematuria, incontinence, genital sores, muscle weakness, suspicious skin lesions, transient blindness, difficulty walking, depression, unusual weight change, abnormal bleeding, enlarged lymph nodes, angioedema, breast masses, and testicular masses.    Physical Exam  General:  Well-developed,well-nourished,in no acute distress; alert,appropriate and cooperative throughout examination Head:  Normocephalic and atraumatic without obvious abnormalities. No apparent alopecia or balding. Eyes:  No corneal or conjunctival inflammation noted. EOMI. Perrla. Funduscopic exam benign, without hemorrhages, exudates or papilledema. Vision grossly normal. Ears:  External ear exam shows no significant lesions or deformities.  Otoscopic examination reveals clear canals, tympanic membranes are  intact bilaterally without bulging, retraction, inflammation or discharge. Hearing is grossly normal bilaterally. Nose:  External nasal examination shows no deformity or inflammation. Nasal mucosa are pink and moist without lesions or exudates. Mouth:  Oral mucosa and oropharynx without lesions or exudates.  Teeth in good repair. Neck:  No deformities, masses, or tenderness noted. Lungs:  diffuse rhonchi and wheezes, no rales  Heart:  Normal rate and regular rhythm. S1 and S2 normal without gallop, murmur, click, rub or other extra sounds.   Impression & Recommendations:  Problem # 1:  ACUTE BRONCHITIS (ICD-466.0)  His updated medication list for this problem includes:    Proair Hfa 108 (90 Base) Mcg/act Aers (Albuterol sulfate) .Marland Kitchen... 2 puffs 4 times daily as needed for wheezing    Hydromet 5-1.5 Mg/75ml Syrp (Hydrocodone-homatropine) .Marland Kitchen... 1 tsp three times a day as needed cough    Biaxin Xl 500 Mg Xr24h-tab (Clarithromycin) .Marland Kitchen..Marland Kitchen Two times a day  Orders: Depo- Medrol 80mg  (J1040) Admin of Therapeutic Inj  intramuscular or subcutaneous ([address])  Problem # 2:  ASTHMA (ICD-493.90)  His updated medication list for this problem includes:    Proair Hfa 108 (90 Base) Mcg/act Aers (Albuterol sulfate) .Marland Kitchen... 2 puffs 4 times daily as needed for wheezing    Prednisone 20 Mg Tabs (Prednisone) .Marland Kitchen... Take 1 tablet by mouth once a day for 5 days  Complete Medication List: 1)  Lisinopril-hydrochlorothiazide 20-12.5 Mg Tabs (Lisinopril-hydrochlorothiazide) .... 2 by mouth once daily 2)  Amlodipine Besylate 10 Mg Tabs (Amlodipine besylate) .... Once daily 3)  Vicodin Es 7.5-750 Mg Tabs (Hydrocodone-acetaminophen) .Marland Kitchen.. 1 every four hours as needed 4)  Naproxen Dr 375 Mg Tbec (Naproxen) .... Take 1 tablet  by mouth two times a day 5)  Multivitamins Tabs (Multiple vitamin) .... Once daily 6)  Lidoderm 5 % Ptch (Lidocaine) .... As needed 7)  Carvedilol 25 Mg Tabs (Carvedilol) .Marland Kitchen.. 1 pill by mouth 2 times  daily 8)  Vitamin C 1000 Mg Tabs (Ascorbic acid) .... Once daily 9)  Proair Hfa 108 (90 Base) Mcg/act Aers (Albuterol sulfate) .... 2 puffs 4 times daily as needed for wheezing 10)  Prednisone 20 Mg Tabs (Prednisone) .... Take 1 tablet by mouth once a day for 5 days 11)  Hydromet 5-1.5 Mg/54ml Syrp (Hydrocodone-homatropine) .Marland Kitchen.. 1 tsp three times a day as needed cough 12)  Biaxin Xl 500 Mg Xr24h-tab (Clarithromycin) .... Two times a day  Patient Instructions: 1)  Please schedule a follow-up appointment as needed .  2)  Use ProAir as needed . Prescriptions: PROAIR HFA 108 (90 BASE) MCG/ACT  AERS (ALBUTEROL SULFATE) 2 puffs 4 times daily as needed for wheezing  #1 x 2   Entered and Authorized by:   Nelwyn Salisbury MD   Signed by:   Nelwyn Salisbury MD on 09/29/2009   Method used:   Electronically to        [facility]  [address] (retail)       [address]       [place], [place]  [address]       Ph: [phone]       Fax: [phone]   RxID:   [id number] BIAXIN XL 500 MG XR24H-TAB (CLARITHROMYCIN) two times a day  #20 x 0   Entered and Authorized by:   Nelwyn Salisbury MD   Signed by:   Nelwyn Salisbury MD on 09/29/2009   Method used:   Electronically to        [facility]  [address] (retail)       [address]       [place], [place]  [address]       Ph: [phone]       Fax: [phone]   RxID:   [id number]    Medication Administration  Injection # 1:    Medication: Depo- Medrol 80mg     Diagnosis: ACUTE BRONCHITIS (ICD-466.0)    Route: IM    Site: RUOQ gluteus    Exp Date: 04/14    Lot #: [id number]    Mfr: [employer]    Comments: 120mg  given    Patient tolerated injection without complications    Given by: Raechel Ache, RN (September 29, 2009 4:52 PM)  Orders Added: 1)  Est. Patient Level IV [05397] 2)  Depo- Medrol 80mg  [J1040] 3)  Admin of Therapeutic Inj  intramuscular or subcutaneous [67341]

## 2010-03-29 NOTE — Letter (Signed)
Summary: [facility]  [facility]   Imported By: Maryln Gottron 07/20/2009 14:18:10  _____________________________________________________________________  External Attachment:    Type:   Image     Comment:   External Document

## 2010-03-29 NOTE — Procedures (Signed)
Summary: COLONOSCOPY  COLONOSCOPY   Imported By: Georgian Co 06/08/2009 16:09:52  _____________________________________________________________________  External Attachment:    Type:   Image     Comment:   External Document

## 2010-03-29 NOTE — Assessment & Plan Note (Signed)
Summary: congestion//ccm   Allergies: 1)  ! Penicillin V Potassium (Penicillin V Potassium) 2)  ! Adult Aspirin Low Strength (Aspirin)   Complete Medication List: 1)  Lisinopril-hydrochlorothiazide 20-12.5 Mg Tabs (Lisinopril-hydrochlorothiazide) .... 2 by mouth once daily 2)  Amlodipine Besylate 10 Mg Tabs (Amlodipine besylate) .... Once daily 3)  Vicodin Es 7.5-750 Mg Tabs (Hydrocodone-acetaminophen) .Marland Kitchen.. 1 every four hours as needed 4)  Naproxen Dr 375 Mg Tbec (Naproxen) .... Take 1 tablet by mouth two times a day 5)  Multivitamins Tabs (Multiple vitamin) .... Once daily 6)  Lidoderm 5 % Ptch (Lidocaine) .... As needed 7)  Carvedilol 25 Mg Tabs (Carvedilol) .Marland Kitchen.. 1 pill by mouth 2 times daily 8)  Vitamin C 1000 Mg Tabs (Ascorbic acid) .... Once daily 9)  Proair Hfa 108 (90 Base) Mcg/act Aers (Albuterol sulfate) .... 2 puffs 4 times daily as needed for wheezing 10)  Prednisone 20 Mg Tabs (Prednisone) .... Take 1 tablet by mouth once a day for 5 days 11)  Hydromet 5-1.5 Mg/21ml Syrp (Hydrocodone-homatropine) .Marland Kitchen.. 1 tsp three times a day as needed cough

## 2010-03-29 NOTE — Consult Note (Signed)
Summary: [facility]  [facility]   Imported By: Maryln Gottron 07/20/2009 14:16:33  _____________________________________________________________________  External Attachment:    Type:   Image     Comment:   External Document

## 2010-03-29 NOTE — Progress Notes (Signed)
Summary: Orthopedic physician  Phone Note Call from Patient Call back at Home Phone [phone]   Caller: Patient Summary of Call: Wants to speak to you about an orthopedic doctor he found.  Please return his call [phone] Initial call taken by: Everrett Coombe,  June 21, 2009 8:32 AM  Follow-up for Phone Call        lmom Follow-up by: Heron Sabins,  June 21, 2009 3:02 PM  Additional Follow-up for Phone Call Additional follow up Details #1::        pt has ov with [place] Bernadene Bell [place] on 06-24-09 230pm pt is aware per [facility] Additional Follow-up by: Heron Sabins,  June 22, 2009 2:52 PM

## 2010-03-29 NOTE — Procedures (Signed)
Summary: Colonoscopy Report/[facility]  Colonoscopy Report/[facility]   Imported By: Maryln Gottron 12/13/2009 10:57:25  _____________________________________________________________________  External Attachment:    Type:   Image     Comment:   External Document

## 2010-03-29 NOTE — Miscellaneous (Signed)
Summary: flu shot  Clinical Lists Changes  Observations: Added new observation of FLU VAX: Historical (10/27/2009 12:19)      Immunization History:  Influenza Immunization History:    Influenza:  historical (10/27/2009) given [facility] [address]

## 2010-03-29 NOTE — Progress Notes (Signed)
Summary: LMTCB  Phone Note Outgoing Call   Call placed by: Lynann Beaver CMA,  September 29, 2009 11:54 AM Call placed to: Patient Summary of Call: Per Dr. Cato Mulligan.  Call pt, girlfriend states he is sick?  LMTCB on phone number in chart? Initial call taken by: Lynann Beaver CMA,  September 29, 2009 11:55 AM  Follow-up for Phone Call        Pt is complaining of SOB and wheezing.  Will schedule with Dr. Clent Ridges this afternoon. Follow-up by: Lynann Beaver CMA,  September 29, 2009 12:10 PM

## 2010-03-29 NOTE — Assessment & Plan Note (Signed)
Vital Signs:  Patient profile:   55 year old male Pulse rate:   72 / minute Resp:     16 per minute  History of Present Illness: 4 day hx of chest congestion has used inhaler a few days has noted some wheezing no SOB  no fever or chills All other systems reviewed and were negative   Current Medications (verified): 1)  Lisinopril-Hydrochlorothiazide 20-12.5 Mg  Tabs (Lisinopril-Hydrochlorothiazide) .... 2 By Mouth Once Daily 2)  Amlodipine Besylate 10 Mg  Tabs (Amlodipine Besylate) .... Once Daily 3)  Vicodin Es 7.5-750 Mg  Tabs (Hydrocodone-Acetaminophen) .Marland Kitchen.. 1 Every Four Hours As Needed 4)  Naproxen Dr 375 Mg  Tbec (Naproxen) .... Take 1 Tablet By Mouth Two Times A Day 5)  Multivitamins   Tabs (Multiple Vitamin) .... Once Daily 6)  Lidoderm 5 %  Ptch (Lidocaine) .... As Needed 7)  Carvedilol 25 Mg  Tabs (Carvedilol) .Marland Kitchen.. 1 Pill By Mouth 2 Times Daily 8)  Vitamin C 1000 Mg Tabs (Ascorbic Acid) .... Once Daily 9)  Proair Hfa 108 (90 Base) Mcg/act  Aers (Albuterol Sulfate) .... 2 Puffs 4 Times Daily As Needed For Wheezing  Allergies (verified): 1)  ! Penicillin V Potassium (Penicillin V Potassium) 2)  ! Adult Aspirin Low Strength (Aspirin)  Physical Exam  General:  alert and well-developed.   Head:  normocephalic and atraumatic.   Eyes:  pupils equal and pupils round.   Neck:  No deformities, masses, or tenderness noted. Lungs:  normal respiratory effort and no intercostal retractions.  bilateral wheeze Heart:  normal rate and regular rhythm.     Impression & Recommendations:  Problem # 1:  ASTHMA (ICD-493.90) proair 4 times daily prednisone 20 mg by mouth once daily  call if any worsening side effects discussed His updated medication list for this problem includes:    Proair Hfa 108 (90 Base) Mcg/act Aers (Albuterol sulfate) .Marland Kitchen... 2 puffs 4 times daily as needed for wheezing    Prednisone 20 Mg Tabs (Prednisone) .Marland Kitchen... Take 1 tablet by mouth once a day for 5  days  Complete Medication List: 1)  Lisinopril-hydrochlorothiazide 20-12.5 Mg Tabs (Lisinopril-hydrochlorothiazide) .... 2 by mouth once daily 2)  Amlodipine Besylate 10 Mg Tabs (Amlodipine besylate) .... Once daily 3)  Vicodin Es 7.5-750 Mg Tabs (Hydrocodone-acetaminophen) .Marland Kitchen.. 1 every four hours as needed 4)  Naproxen Dr 375 Mg Tbec (Naproxen) .... Take 1 tablet by mouth two times a day 5)  Multivitamins Tabs (Multiple vitamin) .... Once daily 6)  Lidoderm 5 % Ptch (Lidocaine) .... As needed 7)  Carvedilol 25 Mg Tabs (Carvedilol) .Marland Kitchen.. 1 pill by mouth 2 times daily 8)  Vitamin C 1000 Mg Tabs (Ascorbic acid) .... Once daily 9)  Proair Hfa 108 (90 Base) Mcg/act Aers (Albuterol sulfate) .... 2 puffs 4 times daily as needed for wheezing 10)  Prednisone 20 Mg Tabs (Prednisone) .... Take 1 tablet by mouth once a day for 5 days 11)  Hydromet 5-1.5 Mg/53ml Syrp (Hydrocodone-homatropine) .Marland Kitchen.. 1 tsp three times a day as needed cough  Patient Instructions: 1)  . Prescriptions: HYDROMET 5-1.5 MG/5ML SYRP (HYDROCODONE-HOMATROPINE) 1 tsp three times a day as needed cough  #240 cc x 0   Entered and Authorized by:   [name] MD   Signed by:   [name] MD on 09/07/2009   Method used:   Print then Give to Patient   RxID:   [id number] PREDNISONE 20 MG TABS (PREDNISONE) Take 1 tablet by mouth once  a day for 5 days  #5 x 0   Entered and Authorized by:   [name] MD   Signed by:   [name] MD on 09/07/2009   Method used:   Electronically to        [facility] (retail)       [address]       [place], [place]  [address]       Ph: [phone]       Fax: [phone]   RxID:   [id number]

## 2010-03-29 NOTE — Progress Notes (Signed)
Summary: [facility] called re: Prednisone. Usually 6 day or 12 day in [employer] Note From Pharmacy Call back at [phone]   [facility]     [facility]   Caller: [facility]  [address]    Summary of Call: [facility] called re: script of Prednisone. Pharmacist says that the pak are usually 6 day ro 12 day. Pls clarify instructions.     Initial call taken by: Lucy Antigua,  September 30, 2009 9:17 AM  Follow-up for Phone Call        6 day per Dr Lucia Estelle.  NGEXBM informed at pharmacy. Follow-up by: Gladis Riffle, RN,  September 30, 2009 9:21 AM

## 2010-03-31 NOTE — Assessment & Plan Note (Signed)
Summary: cough/chest congestion/wheezing/cjr   Vital Signs:  Patient profile:   55 year old male Weight:      322 pounds Temp:     98.5 degrees F oral BP sitting:   140 / 90  (left arm) Cuff size:   large  Vitals Entered By: Sid Falcon LPN (March 02, 2010 2:38 PM)  History of Present Illness: Pt seen with 3 day hx of wheezing. Hx of reported asthma.  Tommy Jackson but only once daily  Cough      This is a 55 year old man who presents with Cough.  onset of cough 3 days ago.  Hx reported asthma.  On Dulera.  The patient reports non-productive cough and wheezing, but denies pleuritic chest pain, shortness of breath, and fever.  Associated symtpoms include nasal congestion and acid reflux symptoms.  The patient denies the following symptoms: sore throat, chronic rhinitis, weight loss, and peripheral edema.  Nonsmoker.  Allergies: 1)  ! Penicillin V Potassium (Penicillin V Potassium) 2)  ! Adult Aspirin Low Strength (Aspirin)  Past History:  Past Medical History: Last updated: 11/29/2005 Hypertension chronic back pain obesity  Past Surgical History: Last updated: 11/29/2005 shoulder surgery  Family History: Last updated: 06/08/2009 Family History of CAD Male 1st degree relative <50 Family History Diabetes 1st degree relative brother colon CA age 68 father 68s colon CA/lung CA  Social History: Last updated: 11/14/2006 Married Never Smoked long term disability  Risk Factors: Smoking Status: never (10/12/2009) PMH-FH-SH reviewed for relevance  Review of Systems  The patient denies anorexia, fever, weight loss, hoarseness, chest pain, syncope, peripheral edema, prolonged cough, headaches, hemoptysis, abdominal pain, melena, hematochezia, severe indigestion/heartburn, and enlarged lymph nodes.    Physical Exam  General:  Well-developed,well-nourished,in no acute distress; alert,appropriate and cooperative throughout examination Ears:  External ear exam shows no  significant lesions or deformities.  Otoscopic examination reveals clear canals, tympanic membranes are intact bilaterally without bulging, retraction, inflammation or discharge. Hearing is grossly normal bilaterally. Mouth:  Oral mucosa and oropharynx without lesions or exudates.  Teeth in good repair. Neck:  No deformities, masses, or tenderness noted. Lungs:  some diffuse expir wheezes.  No rales.  no retractions. Heart:  Normal rate and regular rhythm. S1 and S2 normal without gallop, murmur, click, rub or other extra sounds. Extremities:  no pitting edema.   Impression & Recommendations:  Problem # 1:  ASTHMA (ICD-493.90) Assessment Deteriorated pred taper and increase Dulera to two times a day. His updated medication list for this problem includes:    Ventolin Hfa 108 (90 Base) Mcg/act Aers (Albuterol sulfate) .Marland Kitchen... 2 puffs every 4 hours as needed wheezing    Dulera 100-5 Mcg/act Aero (Mometasone furo-formoterol fum) .Marland Kitchen... 2 puffs two times a day    Prednisone 10 Mg Tabs (Prednisone) .Marland Kitchen... Taper as follows:  6-5-4-4-3-3-2-2-1-1  Complete Medication List: 1)  Lisinopril-hydrochlorothiazide 20-12.5 Mg Tabs (Lisinopril-hydrochlorothiazide) .... 2 by mouth once daily 2)  Amlodipine Besylate 10 Mg Tabs (Amlodipine besylate) .... Once daily 3)  Naproxen Dr 375 Mg Tbec (Naproxen) .... Take 1 tablet by mouth two times a day 4)  Multivitamins Tabs (Multiple vitamin) .... Once daily 5)  Carvedilol 25 Mg Tabs (Carvedilol) .Marland Kitchen.. 1 pill by mouth 2 times daily 6)  Ventolin Hfa 108 (90 Base) Mcg/act Aers (Albuterol sulfate) .... 2 puffs every 4 hours as needed wheezing 7)  Dulera 100-5 Mcg/act Aero (Mometasone furo-formoterol fum) .... 2 puffs two times a day 8)  Prednisone 10 Mg Tabs (  Prednisone) .... Taper as follows:  6-5-4-4-3-3-2-2-1-1  Patient Instructions: 1)  increase Dulera to 2 puffs twice daily. Prescriptions: PREDNISONE 10 MG TABS (PREDNISONE) taper as follows:  6-5-4-4-3-3-2-2-1-1   #31 x 0   Entered and Authorized by:   Evelena Peat MD   Signed by:   Evelena Peat MD on 03/02/2010   Method used:   Electronically to        [facility]  [address] (retail)       [address]       [place], [place]  [address]       Ph: [phone]       Fax: [phone]   RxID:   [id number]    Orders Added: 1)  Est. Patient Level III [56387]

## 2010-04-07 ENCOUNTER — Encounter: Payer: Self-pay | Admitting: Internal Medicine

## 2010-04-07 ENCOUNTER — Ambulatory Visit (INDEPENDENT_AMBULATORY_CARE_PROVIDER_SITE_OTHER): Payer: Medicare PPO | Admitting: Internal Medicine

## 2010-04-07 DIAGNOSIS — R5381 Other malaise: Secondary | ICD-10-CM

## 2010-04-07 DIAGNOSIS — R5383 Other fatigue: Secondary | ICD-10-CM

## 2010-04-07 DIAGNOSIS — I10 Essential (primary) hypertension: Secondary | ICD-10-CM

## 2010-04-07 LAB — BASIC METABOLIC PANEL
BUN: 20 mg/dL (ref 6–23)
GFR: 74.22 mL/min (ref >60.00)
Glucose, Bld: 103 mg/dL — ABNORMAL HIGH (ref 70–99)
Potassium: 3.9 mEq/L (ref 3.5–5.1)

## 2010-04-07 LAB — TSH: TSH: 1.9 u[IU]/mL (ref 0.35–5.50)

## 2010-04-07 LAB — HEPATIC FUNCTION PANEL
Albumin: 4.2 g/dL (ref 3.5–5.2)
Total Protein: 6.7 g/dL (ref 6.0–8.3)

## 2010-04-07 LAB — CBC WITH DIFFERENTIAL/PLATELET
Basophils Absolute: 0 10*3/uL (ref 0.0–0.1)
HCT: 41 % (ref 39.0–52.0)
Lymphs Abs: 2.3 10*3/uL (ref 0.7–4.0)
MCV: 93.6 fl (ref 78.0–100.0)
Monocytes Absolute: 0.7 10*3/uL (ref 0.1–1.0)
Monocytes Relative: 10 % (ref 3.0–12.0)
Platelets: 184 10*3/uL (ref 150.0–400.0)
RDW: 12.6 % (ref 11.5–14.6)

## 2010-04-07 NOTE — Progress Notes (Signed)
  Subjective:    Patient ID: Tommy Jackson, male    DOB: 09-22-1955, 55 y.o.   MRN: [id number]  HPI Not feeling "right" over the past 2 weeks.  He admits to a great deal of stress related to father's estate.  States that with exertion he feels warn out but no CP or SOB He admits to worsening back pain He frequently feels light headed, sometimes feels that his arms are heavy.   He admits that many of his complaint are vague and subjective but he is concerned that somehting isn't right.    Review of Systems  Constitutional: Positive for fatigue. Negative for diaphoresis, activity change and appetite change.  HENT: Negative for congestion, rhinorrhea and neck pain.   Eyes: Negative for photophobia and discharge.  Respiratory: Negative for choking.   Cardiovascular: Negative for chest pain.  Genitourinary: Negative for dysuria and flank pain.  Musculoskeletal: Negative for back pain and arthralgias.  Neurological: Negative for dizziness.  Hematological: Negative for adenopathy.  Psychiatric/Behavioral: Negative for confusion and agitation.       Objective:   Physical Exam  Constitutional: He appears well-developed and well-nourished.  HENT:  Head: Normocephalic and atraumatic.  Right Ear: External ear normal.  Left Ear: External ear normal.  Nose: Nose normal.  Eyes: Right eye exhibits no discharge. No scleral icterus.  Neck: Neck supple. No thyromegaly present.  Cardiovascular: Normal rate, regular rhythm and normal heart sounds.   No murmur heard. Pulmonary/Chest: Effort normal and breath sounds normal. No respiratory distress.  Abdominal: Soft. Bowel sounds are normal. He exhibits no distension. There is no tenderness.  Musculoskeletal: He exhibits no edema.  Lymphadenopathy:    He has no cervical adenopathy.  Neurological: He is alert. No cranial nerve deficit.  Skin: Skin is warm and dry.  Psychiatric: He has a normal mood and affect.          Assessment & Plan:

## 2010-04-09 DIAGNOSIS — R5383 Other fatigue: Secondary | ICD-10-CM | POA: Insufficient documentation

## 2010-04-09 NOTE — Assessment & Plan Note (Signed)
Unclear etiology. We'll check laboratories today. I've asked him to become more physically active. I think he needs to exercise more. It could be that most of his problems are deconditioning. Is also very likely that some of his problems are related to sleep apnea. His girlfriend is with him and he states that he snores and gasps for breath. The patient also admits to daytime hypersomnolence. Sleep apnea was discussed with patient and his girlfriend in detail.

## 2010-04-19 ENCOUNTER — Encounter: Payer: Self-pay | Admitting: Internal Medicine

## 2010-04-20 ENCOUNTER — Encounter: Payer: Self-pay | Admitting: Internal Medicine

## 2010-04-20 ENCOUNTER — Ambulatory Visit (INDEPENDENT_AMBULATORY_CARE_PROVIDER_SITE_OTHER): Payer: Medicare PPO | Admitting: Internal Medicine

## 2010-04-20 DIAGNOSIS — G4733 Obstructive sleep apnea (adult) (pediatric): Secondary | ICD-10-CM

## 2010-04-20 DIAGNOSIS — I10 Essential (primary) hypertension: Secondary | ICD-10-CM

## 2010-04-20 DIAGNOSIS — E785 Hyperlipidemia, unspecified: Secondary | ICD-10-CM

## 2010-04-20 NOTE — Assessment & Plan Note (Signed)
Severe I'll get records from dr dohmeier

## 2010-04-20 NOTE — Assessment & Plan Note (Signed)
Controlled--no meds for the time being

## 2010-04-20 NOTE — Assessment & Plan Note (Signed)
Fair control especially given weight

## 2010-04-23 NOTE — Progress Notes (Signed)
  Subjective:    Patient ID: Tommy Jackson, male    DOB: 10/13/1955, 55 y.o.   MRN: [id number]  HPI  patient comes in for followup. He does feel somewhat better but continues to have significant fatigue and daytime hypersomnolence. He also has a significant history of obesity , hypertension. He is tolerating his medications without difficulty he is not exercising or trying to follow a diet.  Past Medical History  Diagnosis Date  . HYPERTENSION 11/29/2005  . HYPERLIPIDEMIA 02/07/2007  . OBESITY 10/01/2007  . SLEEP APNEA, OBSTRUCTIVE 10/01/2007  . ASTHMA 09/07/2009   Past Surgical History  Procedure Date  . Shoulder surgery     reports that he has never smoked. He does not have any smokeless tobacco history on file. His alcohol and drug histories not on file. family history includes Cancer in his father; Cancer (age of onset:39) in his brother; Colon cancer in his brother and father; Diabetes in his father; Heart attack in his father; Heart disease in his father; and Lung cancer in his father.     Allergies  Allergen Reactions  . Aspirin     REACTION: unspecified  . Penicillins     REACTION: family hx      Review of Systems  patient denies chest pain, shortness of breath, orthopnea. Denies lower extremity edema, abdominal pain, change in appetite, change in bowel movements. Patient denies rashes, musculoskeletal complaints. No other specific complaints in a complete review of systems.      Objective:   Physical Exam  well-developed well-nourished male in no acute distress. HEENT exam atraumatic, normocephalic, neck supple without jugular venous distention. Chest clear to auscultation cardiac exam S1-S2 are regular. Abdominal exam overweight with bowel sounds, soft and nontender. Extremities no edema. Neurologic exam is alert with a normal gait.        Assessment & Plan:

## 2010-05-05 ENCOUNTER — Ambulatory Visit (HOSPITAL_BASED_OUTPATIENT_CLINIC_OR_DEPARTMENT_OTHER): Payer: Medicare PPO | Attending: Internal Medicine

## 2010-05-05 DIAGNOSIS — G4710 Hypersomnia, unspecified: Secondary | ICD-10-CM | POA: Insufficient documentation

## 2010-05-05 DIAGNOSIS — G4730 Sleep apnea, unspecified: Secondary | ICD-10-CM | POA: Insufficient documentation

## 2010-05-05 DIAGNOSIS — R0989 Other specified symptoms and signs involving the circulatory and respiratory systems: Secondary | ICD-10-CM | POA: Insufficient documentation

## 2010-05-05 DIAGNOSIS — R0609 Other forms of dyspnea: Secondary | ICD-10-CM | POA: Insufficient documentation

## 2010-05-11 ENCOUNTER — Encounter: Payer: Self-pay | Admitting: Pulmonary Disease

## 2010-05-11 ENCOUNTER — Institutional Professional Consult (permissible substitution) (INDEPENDENT_AMBULATORY_CARE_PROVIDER_SITE_OTHER): Payer: Medicare PPO | Admitting: Pulmonary Disease

## 2010-05-11 DIAGNOSIS — G4730 Sleep apnea, unspecified: Secondary | ICD-10-CM

## 2010-05-11 DIAGNOSIS — R0989 Other specified symptoms and signs involving the circulatory and respiratory systems: Secondary | ICD-10-CM

## 2010-05-11 DIAGNOSIS — IMO0002 Reserved for concepts with insufficient information to code with codable children: Secondary | ICD-10-CM | POA: Insufficient documentation

## 2010-05-11 DIAGNOSIS — R0609 Other forms of dyspnea: Secondary | ICD-10-CM

## 2010-05-11 DIAGNOSIS — G4733 Obstructive sleep apnea (adult) (pediatric): Secondary | ICD-10-CM

## 2010-05-11 DIAGNOSIS — G4710 Hypersomnia, unspecified: Secondary | ICD-10-CM

## 2010-05-11 DIAGNOSIS — J45909 Unspecified asthma, uncomplicated: Secondary | ICD-10-CM

## 2010-05-26 NOTE — Assessment & Plan Note (Signed)
Summary: consult for osa, asthma   Visit Type:  Initial Consult Copy to:  [name] Primary Provider/Referring Provider:  [name] MD  CC:  Sleep Consult.  Pt states he was dx with OSA but was unable to tolerate a cpap machine or bipap machine. Marland Kitchen  History of Present Illness: The pt is a 55y/o male who I have been asked to see for management of osa.  He recently underwent split night study where he was found to have severe osa with AHI 88/hr and ultimately an optimal cpap level of 17cm.  He was actually diagnosed with severe osa first in 2010, and only tried cpap for about 2 weeks.  He had trouble with the mask coming off, but he was trying to sleep on his stomach.  He even was tried on bipap, but saw no difference in his tolerance.  Currently with loud snoring, abnl breathing pattern during sleep, and only some degree of nonrestorative sleep.  Falls asleep anytime he sits down.  Weight is up 75 pounds in 3.5 yrs.  He also feels the cpap caused recurrent respiratory infections every time he tried to wear compliantly.  He has cough, sob at times, and currently on dulera.  He has not had spirometry.  He also has a lot of nasal congestion with postnasal drip and hoarseness.  He has issues with reflux at times.  Current Medications (verified): 1)  Lisinopril-Hydrochlorothiazide 20-12.5 Mg  Tabs (Lisinopril-Hydrochlorothiazide) .... 2 By Mouth Once Daily 2)  Amlodipine Besylate 10 Mg  Tabs (Amlodipine Besylate) .... Once Daily 3)  Naproxen Dr 375 Mg  Tbec (Naproxen) .... Take 1 Tablet By Mouth Once A Day 4)  Multivitamins   Tabs (Multiple Vitamin) .... Once Daily 5)  Carvedilol 25 Mg  Tabs (Carvedilol) .Marland Kitchen.. 1 Pill By Mouth 2 Times Daily 6)  Ventolin Hfa 108 (90 Base) Mcg/act Aers (Albuterol Sulfate) .... 2 Puffs Every 4 Hours As Needed Wheezing 7)  Dulera 100-5 Mcg/act Aero (Mometasone Furo-Formoterol Fum) .... Inhale 2 Puffs Once Daily  Allergies (verified): 1)  ! Penicillin V Potassium  (Penicillin V Potassium) 2)  ! Adult Aspirin Low Strength (Aspirin)  Past History:  Past Medical History:  HERNIATED DISC (ICD-722.2) ASTHMA (ICD-493.90) SLEEP APNEA, OBSTRUCTIVE (ICD-327.23)--AHI 88/hr 2012 OBESITY (ICD-278.00) HYPERLIPIDEMIA (ICD-272.4) HYPERTENSION (ICD-401.9)    Past Surgical History: R shoulder surgery  Family History: Reviewed history from 06/08/2009 and no changes required. Family History of CAD Male 1st degree relative <50   father, brother Family History Diabetes 1st degree relative brother colon CA age 76 father 29s colon CA/lung CA mother with  lung cancer.  allegies: mother, brother  Social History: Reviewed history from 11/14/2006 and no changes required. divorced and lives alone. pt has children. 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 long term disability  Review of Systems       The patient complains of non-productive cough, acid heartburn, indigestion, weight change, and nasal congestion/difficulty breathing through nose.  The patient denies shortness of breath with activity, shortness of breath at rest, productive cough, coughing up blood, chest pain, irregular heartbeats, loss of appetite, abdominal pain, difficulty swallowing, sore throat, tooth/dental problems, headaches, sneezing, itching, ear ache, anxiety, depression, hand/feet swelling, joint stiffness or pain, rash, change in color of mucus, and fever.    Vital Signs:  Patient profile:   55 year old male Height:      72 inches Weight:      325.38 pounds BMI:  44.29 O2 Sat:      94 % on Room air Temp:     98.2 degrees F oral Pulse rate:   80 / minute BP sitting:   154 / 94  (left arm) Cuff size:   large  Vitals Entered By: Arman Filter LPN (May 11, 2010 10:23 AM)  O2 Flow:  Room air CC: Sleep Consult.  Pt states he was dx with OSA but was unable to tolerate a cpap machine or bipap machine.  Comments Medications reviewed with patient Arman Filter LPN  May 11, 2010 10:23 AM    Physical Exam  General:  obese male in nad Eyes:  PERRLA and EOMI.   Nose:  patent without discharge, but left side with swollen turbinates and erythema of mucosa Mouth:  long palate and uvula, very narrow posteriorly Neck:  no jvd,tmg, LN  Lungs:  diffuse wheezing that is very difficult to determine if from upper airway or lower. He clearly has an element of upper airway pseudowheezing. Heart:  rrr, no mrg  Abdomen:  soft and nontender, bs+ Extremities:  no edema or cyanosis  pulses intact distally Neurologic:  alert, oriented, moves all 4    Impression & Recommendations:  Problem # 1:  SLEEP APNEA, OBSTRUCTIVE (ICD-327.23) the pt has severe osa that will need aggressive treatment with cpap.  I have stressed to the pt the need to make cpap work given the health risk this represents for him.  Will start cpap at moderate level to allow for desensitization, and later try to get him to optimal pressure.  I have also encouraged him to work aggressively on weight loss.   Problem # 2:  ASTHMA (ICD-493.90) the pt has definite airflow obstruction on spirometry today, and is on a good medication in dulera.  I suspect he has multiple issues that are contributing to his ongoing symptoms, including allergic rhinitis, ?LPR, and final the ACE inhibitor which may be de-stabilizing his upper airway.  Will leave him on dulera, and treat his AR and LPR more aggressively.  I would also suggest coming off ACE.    Medications Added to Medication List This Visit: 1)  Naproxen Dr 375 Mg Tbec (Naproxen) .... Take 1 tablet by mouth once a day 2)  Dulera 100-5 Mcg/act Aero (Mometasone furo-formoterol fum) .... Inhale 2 puffs once daily 3)  Nexium 40 Mg Cpdr (Esomeprazole magnesium) .... Take 1 tablet by mouth once a day at bedtime  Other Orders: Consultation Level V (65784) DME Referral (DME)  Patient Instructions: 1)  try nasonex 2 sprays each nostril each am. 2)   zyrtec 10mg  one at bedtime each night 3)  will send a note to Dr. Cato Mulligan to consider getting you off lisinopril as a trial.   4)  trial of Nexium 40mg  one each night at bedtime. 5)  will start on cpap with moderate pressure level, with mask from sleep center.  please call if you feel pressure is inadequate, and also if you are having tolerance issues. 6)  try wearing cpap during day while watching tv for about 20-42min twice a day to get used to mask.   7)  work on weight loss 8)  stay on dulera. 9)  followup with me in 5 weeks.   Prescriptions: NEXIUM 40 MG CPDR (ESOMEPRAZOLE MAGNESIUM) Take 1 tablet by mouth once a day at bedtime  #30 x 0   Entered by:   Arman Filter LPN   Authorized by:   Barbaraann Share  MD   Signed by:   Arman Filter LPN on 16/11/9602   Method used:   Electronically to        [facility] (retail)       [address]       [place], [place]  [address]       Ph: [phone]       Fax: [phone]   RxID:   [id number]

## 2010-06-14 ENCOUNTER — Encounter: Payer: Self-pay | Admitting: Pulmonary Disease

## 2010-06-15 ENCOUNTER — Encounter: Payer: Self-pay | Admitting: Pulmonary Disease

## 2010-06-15 ENCOUNTER — Ambulatory Visit (INDEPENDENT_AMBULATORY_CARE_PROVIDER_SITE_OTHER): Payer: Medicare PPO | Admitting: Pulmonary Disease

## 2010-06-15 DIAGNOSIS — G4733 Obstructive sleep apnea (adult) (pediatric): Secondary | ICD-10-CM

## 2010-06-15 DIAGNOSIS — J45909 Unspecified asthma, uncomplicated: Secondary | ICD-10-CM

## 2010-06-15 MED ORDER — MOMETASONE FURO-FORMOTEROL FUM 100-5 MCG/ACT IN AERO: 2.0000 | INHALATION_SPRAY | Freq: Two times a day (BID) | RESPIRATORY_TRACT | Status: DC

## 2010-06-15 NOTE — Progress Notes (Signed)
  Subjective:    Patient ID: Tommy Jackson, male    DOB: [birth year], 55 y.o.   MRN: [id number]  HPI The pt comes in today for f/u of his osa and asthma.  He has done fairly well on dulera, but recently ran out and has had some increased wheezing.  He is wearing cpap compliantly, and has seen the difference wrt to sleepiness in the afternoon.  He is due for a pressure increase as we slowly get him to his optimal pressure.  He denies any issues with mask fit.    Review of Systems  Constitutional: Negative for fever and unexpected weight change.  HENT: Negative for ear pain, nosebleeds, congestion, sore throat, rhinorrhea, sneezing, trouble swallowing, dental problem, postnasal drip and sinus pressure.   Eyes: Negative for redness and itching.  Respiratory: Positive for cough and wheezing. Negative for chest tightness and shortness of breath.   Cardiovascular: Negative for palpitations and leg swelling.  Gastrointestinal: Negative for nausea and vomiting.  Genitourinary: Negative for dysuria.  Musculoskeletal: Negative for joint swelling.  Skin: Negative for rash.  Neurological: Negative for headaches.  Hematological: Does not bruise/bleed easily.  Psychiatric/Behavioral: Negative for dysphoric mood. The patient is not nervous/anxious.        Objective:   Physical Exam Obese male in nad No purulence or discharge noted No skin breakdown or pressure necrosis from cpap mask Chest with mild expir wheeze at bases Cor with rrr Mild LE edema, no cyanosis Alert and oriented, moves all 4        Assessment & Plan:

## 2010-06-15 NOTE — Patient Instructions (Signed)
Stay on dulera, and will call in refill for you. Work on weight loss Will get cpap increased to 14cm today, then 17 in 3weeks followup with me in 6mos, but call if issues with cpap Talk with Dr. Cato Mulligan about coming off lisinopril

## 2010-06-22 ENCOUNTER — Encounter: Payer: Self-pay | Admitting: Pulmonary Disease

## 2010-06-22 NOTE — Assessment & Plan Note (Signed)
The pt is adapting well to cpap, and feels it has helped his sleep and daytime alertness.  He is slowly getting to his optimal pressure of 17cm.  He denies any issues with the mask or pressure.  Will have pressure adjusted further, and I have encouraged him to work aggressively on weight loss.

## 2010-06-22 NOTE — Assessment & Plan Note (Signed)
The pt was doing very well on dulera, but recently ran out.  I have asked him to get back on the medication, and to work on compliance.  I have stressed to him again the need for asthma pts to stay on daily meds no matter if they are doing well.

## 2010-06-29 ENCOUNTER — Other Ambulatory Visit: Payer: Self-pay | Admitting: *Deleted

## 2010-06-29 DIAGNOSIS — I10 Essential (primary) hypertension: Secondary | ICD-10-CM

## 2010-06-29 MED ORDER — LISINOPRIL-HYDROCHLOROTHIAZIDE 20-12.5 MG PO TABS: 2.0000 | ORAL_TABLET | Freq: Every day | ORAL | Status: DC

## 2010-07-05 ENCOUNTER — Telehealth: Payer: Self-pay | Admitting: Pulmonary Disease

## 2010-07-05 ENCOUNTER — Other Ambulatory Visit: Payer: Self-pay | Admitting: Pulmonary Disease

## 2010-07-05 DIAGNOSIS — G4733 Obstructive sleep apnea (adult) (pediatric): Secondary | ICD-10-CM

## 2010-07-05 NOTE — Telephone Encounter (Signed)
Pt says new pressure is too high. He feels like he is fighting to exhale and is requesting that his pressure be changed back to 14. Pt needs this done today before he goes out of town tomorrow. Pt uses [facility]. Pls advise.

## 2010-07-05 NOTE — Telephone Encounter (Signed)
Pt aware cpap pressure will be decreased and he will call if he continues to have problems.

## 2010-07-05 NOTE — Telephone Encounter (Signed)
Patient phoned stated that he was returning a call to the nurse he can be reached at [phone].Vedia Coffer

## 2010-07-05 NOTE — Telephone Encounter (Signed)
lmomtcb x 1. Order sent to [facility]. 

## 2010-07-05 NOTE — Telephone Encounter (Signed)
Let him know will send an order now to [facility] to decrease to 14.

## 2010-07-18 ENCOUNTER — Encounter: Payer: Self-pay | Admitting: Pulmonary Disease

## 2010-07-18 ENCOUNTER — Ambulatory Visit (INDEPENDENT_AMBULATORY_CARE_PROVIDER_SITE_OTHER): Payer: Medicare PPO | Admitting: Pulmonary Disease

## 2010-07-18 DIAGNOSIS — G4733 Obstructive sleep apnea (adult) (pediatric): Secondary | ICD-10-CM

## 2010-07-18 DIAGNOSIS — J45909 Unspecified asthma, uncomplicated: Secondary | ICD-10-CM

## 2010-07-18 NOTE — Patient Instructions (Signed)
Please call Dr. Marliss Coots office and see if they would give you a trial off lisinopril You need to take dulera 2 puffs in am and pm everyday!! Can take zyrtec 10mg  at bedtime for allergies along with your nasal spray Part of you nasal congestion is due to lack on humidity on your cpap machine.  Will have dme show you how to use heater on your humidifier. Will leave your machine on 14 for now, and allow you to get better adapted and your airway issues to improve.   Please call me in 2-3 weeks to let me know how cpap is going, and can increase to your 17cm level if doing better Work on weight loss followup with me in 4mos or sooner if not doing well.

## 2010-07-18 NOTE — Progress Notes (Signed)
  Subjective:    Patient ID: Tommy Jackson, male    DOB: 12-23-1955, 55 y.o.   MRN: [id number]  HPI The pt comes in today for f/u of his known osa and asthma.  He is c/o ongoing dry cough that he describes as a "tickle" in his throat, but denies any actual breathing issues.  He is taking dulera, but only using once a day.  I have stressed to him the importance of taking twice a day.  He is still on an ACE inhibitor.  Regarding his osa, he is having difficulty tolerating the optimal pressure of 17cm.  He has been decreased to 14, and is doing much better.    Review of Systems  Constitutional: Negative for fever and unexpected weight change.  HENT: Positive for congestion, rhinorrhea and postnasal drip. Negative for ear pain, nosebleeds, sore throat, sneezing, trouble swallowing, dental problem and sinus pressure.   Eyes: Negative for redness and itching.  Respiratory: Positive for cough and wheezing. Negative for chest tightness and shortness of breath.   Cardiovascular: Positive for leg swelling. Negative for palpitations.  Gastrointestinal: Negative for nausea and vomiting.  Genitourinary: Negative for dysuria.  Musculoskeletal: Negative for joint swelling.  Skin: Negative for rash.  Neurological: Negative for headaches.  Hematological: Does not bruise/bleed easily.  Psychiatric/Behavioral: Negative for dysphoric mood. The patient is not nervous/anxious.        Objective:   Physical Exam Obese male in nad No skin breakdown or pressure necrosis from cpap mask.  No purulence or discharge noted.  Chest with good airflow, no true wheezing, very prominent upper airway pseudowheezing . Cor with rrr LE with no significant edema, no cyanosis  Alert, does not appear sleepy, moves all 4        Assessment & Plan:

## 2010-07-19 ENCOUNTER — Telehealth: Payer: Self-pay | Admitting: *Deleted

## 2010-07-19 MED ORDER — LOSARTAN POTASSIUM 100 MG PO TABS: 100.0000 mg | ORAL_TABLET | Freq: Every day | ORAL | Status: DC

## 2010-07-19 NOTE — Telephone Encounter (Signed)
Notified pt. 

## 2010-07-19 NOTE — Telephone Encounter (Signed)
Pt states Dr Shelle Iron would like Dr. Cato Mulligan to take him off of Lisinopril/hct due to cough.

## 2010-07-19 NOTE — Telephone Encounter (Signed)
D/clisinopril. Start losartan 100 mg po qd

## 2010-07-20 NOTE — Assessment & Plan Note (Signed)
The pt is trying to wear cpap compliantly, but is not able to tolerate the optimal pressure of 17 currently.  Part of this may be his upper airway issues with cough?  We could consider going to bipap, but the pt really has not had a fair shot at desensitization.  Will leave at 14 for a period of time, work on his cough, then try to get up to pressure of 17cm.  If he still fails, would change over to bilevel.

## 2010-07-20 NOTE — Assessment & Plan Note (Signed)
The pt feels his breathing is doing better on dulera, but I have stressed to him the need to take twice a day.  I do not think his cough is due to his airways disease currently.  It really sounds more upper airway, with ACE being likely culprit.

## 2010-09-27 ENCOUNTER — Ambulatory Visit: Payer: Medicare PPO | Admitting: Internal Medicine

## 2010-10-28 ENCOUNTER — Ambulatory Visit: Payer: Medicare PPO | Admitting: Internal Medicine

## 2010-11-04 ENCOUNTER — Ambulatory Visit (INDEPENDENT_AMBULATORY_CARE_PROVIDER_SITE_OTHER): Payer: Medicare PPO | Admitting: Internal Medicine

## 2010-11-04 ENCOUNTER — Encounter: Payer: Self-pay | Admitting: Internal Medicine

## 2010-11-04 DIAGNOSIS — E669 Obesity, unspecified: Secondary | ICD-10-CM

## 2010-11-04 DIAGNOSIS — I10 Essential (primary) hypertension: Secondary | ICD-10-CM

## 2010-11-04 MED ORDER — AMLODIPINE BESYLATE 10 MG PO TABS: 5.0000 mg | ORAL_TABLET | Freq: Every day | ORAL | Status: DC

## 2010-11-04 NOTE — Progress Notes (Signed)
  Subjective:    Patient ID: Tommy Jackson, male    DOB: 08-Jul-1955, 55 y.o.   MRN: [id number]  HPI  htn---has noted LE edema, only occurs later in the day  Weight gain---not exercising  Lipids: no meds  Past Medical History  Diagnosis Date  . HYPERTENSION 11/29/2005  . HYPERLIPIDEMIA 02/07/2007  . OBESITY 10/01/2007  . SLEEP APNEA, OBSTRUCTIVE 10/01/2007  . ASTHMA 09/07/2009  . Herniated disc    Past Surgical History  Procedure Date  . Shoulder surgery     reports that he has never smoked. He does not have any smokeless tobacco history on file. His alcohol and drug histories not on file. family history includes Cancer in his father; Cancer (age of onset:39) in his brother; Colon cancer in his brother and father; Diabetes in his father; Heart attack in his father; Heart disease in his father; and Lung cancer in his father. Allergies  Allergen Reactions  . Aspirin     REACTION: unspecified  . Penicillins     REACTION: family hx     Review of Systems  patient denies chest pain, shortness of breath, orthopnea. Denies lower extremity edema, abdominal pain, change in appetite, change in bowel movements. Patient denies rashes, musculoskeletal complaints. No other specific complaints in a complete review of systems.      Objective:   Physical Exam   well-developed well-nourished male in no acute distress. HEENT exam atraumatic, normocephalic, neck supple without jugular venous distention. Chest clear to auscultation cardiac exam S1-S2 are regular. Abdominal exam overweight with bowel sounds, soft and nontender. Extremities no edema. Neurologic exam is alert with a normal gait.       Assessment & Plan:

## 2010-11-10 NOTE — Assessment & Plan Note (Signed)
BP Readings from Last 3 Encounters:  11/04/10 126/82  07/18/10 134/74  06/15/10 130/78    Fair control Edema likely related to high dose amlodipine Decreased dose

## 2010-11-10 NOTE — Assessment & Plan Note (Signed)
htn likely a direct result of obesity.  Advised aggressive weight loss

## 2011-02-28 HISTORY — PX: ELBOW SURGERY: SHX618

## 2011-04-06 ENCOUNTER — Encounter (HOSPITAL_BASED_OUTPATIENT_CLINIC_OR_DEPARTMENT_OTHER): Payer: Self-pay | Admitting: *Deleted

## 2011-04-06 ENCOUNTER — Emergency Department (HOSPITAL_BASED_OUTPATIENT_CLINIC_OR_DEPARTMENT_OTHER)
Admission: EM | Admit: 2011-04-06 | Discharge: 2011-04-06 | Disposition: A | Discharge status: Home / Self Care | Payer: Medicare PPO | Attending: Emergency Medicine | Admitting: Emergency Medicine

## 2011-04-06 ENCOUNTER — Emergency Department (INDEPENDENT_AMBULATORY_CARE_PROVIDER_SITE_OTHER): Payer: Medicare PPO

## 2011-04-06 DIAGNOSIS — M79609 Pain in unspecified limb: Secondary | ICD-10-CM

## 2011-04-06 DIAGNOSIS — S46909A Unspecified injury of unspecified muscle, fascia and tendon at shoulder and upper arm level, unspecified arm, initial encounter: Secondary | ICD-10-CM | POA: Insufficient documentation

## 2011-04-06 DIAGNOSIS — J45909 Unspecified asthma, uncomplicated: Secondary | ICD-10-CM | POA: Insufficient documentation

## 2011-04-06 DIAGNOSIS — E669 Obesity, unspecified: Secondary | ICD-10-CM | POA: Insufficient documentation

## 2011-04-06 DIAGNOSIS — E785 Hyperlipidemia, unspecified: Secondary | ICD-10-CM | POA: Insufficient documentation

## 2011-04-06 DIAGNOSIS — I10 Essential (primary) hypertension: Secondary | ICD-10-CM | POA: Insufficient documentation

## 2011-04-06 DIAGNOSIS — G4733 Obstructive sleep apnea (adult) (pediatric): Secondary | ICD-10-CM | POA: Insufficient documentation

## 2011-04-06 DIAGNOSIS — X500XXA Overexertion from strenuous movement or load, initial encounter: Secondary | ICD-10-CM

## 2011-04-06 DIAGNOSIS — S4980XA Other specified injuries of shoulder and upper arm, unspecified arm, initial encounter: Secondary | ICD-10-CM | POA: Insufficient documentation

## 2011-04-06 DIAGNOSIS — S46109A Unspecified injury of muscle, fascia and tendon of long head of biceps, unspecified arm, initial encounter: Secondary | ICD-10-CM

## 2011-04-06 NOTE — ED Notes (Signed)
Patient transported to X-ray ambulatory 

## 2011-04-06 NOTE — ED Notes (Signed)
Patient transported to X-ray 

## 2011-04-06 NOTE — ED Provider Notes (Signed)
History     CSN: [id number]  Arrival date & time 04/06/11  1629   First MD Initiated Contact with Patient 04/06/11 1636      Chief Complaint  Patient presents with  . Arm Pain    (Consider location/radiation/quality/duration/timing/severity/associated sxs/prior treatment) Patient is a 56 y.o. male presenting with arm pain. The history is provided by the patient.  Arm Pain This is a new problem. The current episode started 1 to 2 hours ago. The problem occurs constantly. The problem has not changed since onset.Pertinent negatives include no chest pain, no abdominal pain, no headaches and no shortness of breath. The symptoms are aggravated by bending. The symptoms are relieved by rest.   Acute onset of right elbow pain while trying to lean over and lift a washing machine into the back of a pickup truck patient felt something snap in the area of his right elbow had immediate pain and increased pain with flexion of the arm at the elbow no pain with movement of the wrist. Patient has hydrocodone at home and took one of those pills with some relief in the discomfort. He arm feels best in the flexed position bent at the elbow at 90. Initial pain was 10 out of 10 current pain is 6/10. HTN sharp nonradiating.  Past Medical History  Diagnosis Date  . HYPERTENSION 11/29/2005  . HYPERLIPIDEMIA 02/07/2007  . OBESITY 10/01/2007  . SLEEP APNEA, OBSTRUCTIVE 10/01/2007  . ASTHMA 09/07/2009  . Herniated disc     Past Surgical History  Procedure Date  . Shoulder surgery     Family History  Problem Relation Age of Onset  . Colon cancer Father   . Lung cancer Father   . Heart disease Father   . Diabetes Father   . Heart attack Father   . Cancer Father     colon/ lung  . Colon cancer Brother   . Cancer Brother 64    colon    History  Substance Use Topics  . Smoking status: Never Smoker   . Smokeless tobacco: Not on file  . Alcohol Use: Yes     occ      Review of Systems    Constitutional: Negative for fever and chills.  HENT: Negative for neck pain.   Eyes: Negative for visual disturbance.  Respiratory: Negative for shortness of breath.   Cardiovascular: Negative for chest pain.  Gastrointestinal: Negative for abdominal pain.  Genitourinary: Negative for dysuria.  Musculoskeletal: Negative for joint swelling.  Skin: Negative for rash.  Neurological: Negative for headaches.  Hematological: Does not bruise/bleed easily.    Allergies  Aspirin and Penicillins  Home Medications   Current Outpatient Rx  Name Route Sig Dispense Refill  . ALBUTEROL SULFATE HFA 108 (90 BASE) MCG/ACT IN AERS Inhalation Inhale 2 puffs into the lungs every 4 (four) hours as needed.      Marland Kitchen AMLODIPINE BESYLATE 10 MG PO TABS Oral Take 0.5 tablets (5 mg total) by mouth daily.    Marland Kitchen CARVEDILOL 25 MG PO TABS Oral Take 25 mg by mouth 2 (two) times daily with meals.      Marland Kitchen HYDROCODONE-ACETAMINOPHEN 7.5-750 MG PO TABS Oral Take 1 tablet by mouth every 6 (six) hours as needed. Patient used this medication for pain in his arm.    Marland Kitchen LISINOPRIL-HYDROCHLOROTHIAZIDE 20-12.5 MG PO TABS Oral Take 2 tablets by mouth daily. 180 tablet 3  . LOSARTAN POTASSIUM 100 MG PO TABS Oral Take 1 tablet (100 mg total) by mouth  daily. 90 tablet 3  . MELOXICAM 15 MG PO TABS Oral Take 1 tablet by mouth daily.    . MOMETASONE FURO-FORMOTEROL FUM 100-5 MCG/ACT IN AERO Inhalation Inhale 2 puffs into the lungs 2 (two) times daily. 1 Inhaler 6  . MULTIVITAMINS PO CAPS Oral Take 1 capsule by mouth daily.      . MOMETASONE FUROATE 50 MCG/ACT NA SUSP Nasal 2 sprays by Nasal route daily as needed.        BP 161/113  Pulse 74  Temp(Src) 98.1 F (36.7 C) (Oral)  Resp 16  Ht 6' (1.829 m)  Wt 320 lb (145.151 kg)  BMI 43.40 kg/m2  SpO2 95%  Physical Exam  Nursing note and vitals reviewed. Constitutional: He is oriented to person, place, and time. He appears well-developed and well-nourished. No distress.  HENT:   Head: Normocephalic and atraumatic.  Eyes: Conjunctivae and EOM are normal. Pupils are equal, round, and reactive to light.  Neck: Normal range of motion. Neck supple.  Cardiovascular: Normal rate, regular rhythm, normal heart sounds and intact distal pulses.   No murmur heard. Pulmonary/Chest: Effort normal and breath sounds normal.  Abdominal: Soft. Bowel sounds are normal. There is no tenderness.  Musculoskeletal: He exhibits tenderness. He exhibits no edema.       Normal except for area of the right elbow tenderness to the distal part of the biceps exacerbated by flexion of the elbow no pain with extension or flexion of the wrist. Bicep muscle is at least partially intact. Radial pulse distally is 2+. Sensation is intact.  Neurological: He is alert and oriented to person, place, and time. No cranial nerve deficit. He exhibits normal muscle tone. Coordination normal.  Skin: Skin is warm. No rash noted. No erythema.    ED Course  Procedures (including critical care time)  Labs Reviewed - No data to display Dg Elbow Complete Right  04/06/2011  *RADIOLOGY REPORT*  Clinical Data: arm pain post lifting  RIGHT ELBOW - COMPLETE 3+ VIEW  Comparison: None.  Findings: Four views of the right elbow submitted.  No acute fracture or subluxation.  No posterior fat pad sign.  Spurring of the dorsal proximal ulna noted.  IMPRESSION: No acute fracture or subluxation.  No posterior fat pad sign.  Original Report Authenticated By: Natasha Mead, M.D.     1. Injury of tendon of long head of biceps       MDM   Suspect right biceps tendon rupture or injury or tear of the biceps muscle. X-rays of the old low as expected were negative. Patient has a local orthopedic surgeon that he uses on a regular basis he will call tomorrow for followup. Will treat with sling in the meantime.        Shelda Jakes, MD 04/06/11 [phone]

## 2011-04-06 NOTE — ED Notes (Signed)
Lifting washer today felt and heard something pop near right elbow

## 2011-04-11 ENCOUNTER — Telehealth: Payer: Self-pay | Admitting: Family Medicine

## 2011-04-11 DIAGNOSIS — I10 Essential (primary) hypertension: Secondary | ICD-10-CM

## 2011-04-11 MED ORDER — CARVEDILOL 25 MG PO TABS: 25.0000 mg | ORAL_TABLET | Freq: Two times a day (BID) | ORAL | Status: DC

## 2011-04-11 MED ORDER — AMLODIPINE BESYLATE 10 MG PO TABS: 5.0000 mg | ORAL_TABLET | Freq: Every day | ORAL | Status: DC

## 2011-04-11 MED ORDER — LOSARTAN POTASSIUM 100 MG PO TABS: 100.0000 mg | ORAL_TABLET | Freq: Every day | ORAL | Status: DC

## 2011-04-11 MED ORDER — LISINOPRIL-HYDROCHLOROTHIAZIDE 20-12.5 MG PO TABS: 2.0000 | ORAL_TABLET | Freq: Every day | ORAL | Status: DC

## 2011-04-11 NOTE — Telephone Encounter (Signed)
rx's sent in electronically 

## 2011-04-11 NOTE — Telephone Encounter (Signed)
Patient gets his meds through [facility]. He states he called [facility] last week to get refills, but [facility] is telling him that we have not been reachable and have not given them the scripts. Please send all this meds to be refilled at [facility] (fax# [phone]).

## 2011-04-17 ENCOUNTER — Other Ambulatory Visit: Payer: Self-pay | Admitting: Internal Medicine

## 2011-04-17 NOTE — Telephone Encounter (Signed)
Medications were sent to [facility] on 04/11/11.

## 2011-04-17 NOTE — Telephone Encounter (Signed)
Pt needs losartan 100 mg,amlodipine 10mg  and carvedilol 25 mg #90 with 3 refills fax to [facility] [phone]

## 2011-05-01 ENCOUNTER — Other Ambulatory Visit: Payer: Self-pay

## 2011-05-01 DIAGNOSIS — I10 Essential (primary) hypertension: Secondary | ICD-10-CM

## 2011-05-01 MED ORDER — LOSARTAN POTASSIUM 100 MG PO TABS: 100.0000 mg | ORAL_TABLET | Freq: Every day | ORAL | Status: DC

## 2011-05-01 NOTE — Telephone Encounter (Signed)
Pt states he received an rx from [facility] for Lisinopril and pt states he was taken off Lisinopril last year.  Pt states he called [facility] and was told that his rx for losartan was sent but pt states he never received it.  Pt states he had surgery on last Friday and only has one losartan pill left.  Pt would like a 30 day supply sent to [facility] on [address] in [facility].  Pls advise.

## 2011-05-01 NOTE — Telephone Encounter (Signed)
rx sent in electronically 

## 2011-05-03 ENCOUNTER — Telehealth: Payer: Self-pay | Admitting: Internal Medicine

## 2011-05-03 NOTE — Telephone Encounter (Signed)
Pharmacy updated.

## 2011-05-03 NOTE — Telephone Encounter (Signed)
Pt no longer uses [facility]. Pls remove from pts pharmacy preference. Pt req to use [facility] on [address] in [place] for all future meds.

## 2011-05-16 ENCOUNTER — Telehealth: Payer: Self-pay | Admitting: Internal Medicine

## 2011-05-16 MED ORDER — MOMETASONE FUROATE 50 MCG/ACT NA SUSP: 2.0000 | Freq: Every day | NASAL | Status: DC | PRN

## 2011-05-16 NOTE — Telephone Encounter (Signed)
Pt req refill of mometasone (NASONEX) 50 MCG/ACT nasal spray [facility] [address] in [place]. Pt is completely out of med and is req that this be called in today.

## 2011-05-16 NOTE — Telephone Encounter (Signed)
Rx sent to pharmacy   

## 2011-06-22 ENCOUNTER — Ambulatory Visit (INDEPENDENT_AMBULATORY_CARE_PROVIDER_SITE_OTHER): Payer: Medicare PPO | Admitting: Family

## 2011-06-22 ENCOUNTER — Encounter: Payer: Self-pay | Admitting: Family

## 2011-06-22 VITALS — BP 150/90 | Temp 98.8°F | Wt 327.0 lb

## 2011-06-22 DIAGNOSIS — R05 Cough: Secondary | ICD-10-CM

## 2011-06-22 DIAGNOSIS — J209 Acute bronchitis, unspecified: Secondary | ICD-10-CM

## 2011-06-22 MED ORDER — AZITHROMYCIN 250 MG PO TABS: ORAL_TABLET | ORAL | Status: AC

## 2011-06-22 MED ORDER — HYDROCOD POLST-CHLORPHEN POLST 10-8 MG/5ML PO LQCR: 5.0000 mL | Freq: Two times a day (BID) | ORAL | Status: DC | PRN

## 2011-06-22 MED ORDER — PREDNISONE 20 MG PO TABS: ORAL_TABLET | ORAL | Status: AC

## 2011-06-22 NOTE — Patient Instructions (Signed)

## 2011-06-22 NOTE — Progress Notes (Signed)
Subjective:    Patient ID: Tommy Jackson, male    DOB: 17-Apr-1955, 56 y.o.   MRN: [id number]  Cough This is a new problem. The current episode started 1 to 4 weeks ago. The problem has been gradually worsening. The cough is productive of sputum. Associated symptoms include postnasal drip, shortness of breath and wheezing. He has tried ipratropium inhaler for the symptoms. The treatment provided moderate relief. His past medical history is significant for bronchitis.  Wheezing  Associated symptoms include coughing and shortness of breath.      Review of Systems  Constitutional: Negative.   HENT: Positive for postnasal drip.   Respiratory: Positive for cough, shortness of breath and wheezing.   Cardiovascular: Negative.   Skin: Negative.   Neurological: Negative.   Hematological: Negative.   Psychiatric/Behavioral: Negative.    Past Medical History  Diagnosis Date  . HYPERTENSION 11/29/2005  . HYPERLIPIDEMIA 02/07/2007  . OBESITY 10/01/2007  . SLEEP APNEA, OBSTRUCTIVE 10/01/2007  . ASTHMA 09/07/2009  . Herniated disc     History   Social History  . Marital Status: Single    Spouse Name: N/A    Number of Children: N/A  . Years of Education: N/A   Occupational History  . retired truck Mining engineer term disability    Social History Main Topics  . Smoking status: Never Smoker   . Smokeless tobacco: Not on file  . Alcohol Use: Yes     occ  . Drug Use: No  . Sexually Active: Yes   Other Topics Concern  . Not on file   Social History Narrative  . No narrative on file    Past Surgical History  Procedure Date  . Shoulder surgery     Family History  Problem Relation Age of Onset  . Colon cancer Father   . Lung cancer Father   . Heart disease Father   . Diabetes Father   . Heart attack Father   . Cancer Father     colon/ lung  . Colon cancer Brother   . Cancer Brother 48    colon    Allergies  Allergen Reactions  . Aspirin     REACTION: unspecified  .  Penicillins     REACTION: family hx    Current Outpatient Prescriptions on File Prior to Visit  Medication Sig Dispense Refill  . albuterol (VENTOLIN HFA) 108 (90 BASE) MCG/ACT inhaler Inhale 2 puffs into the lungs every 4 (four) hours as needed.        Marland Kitchen amLODipine (NORVASC) 10 MG tablet Take 0.5 tablets (5 mg total) by mouth daily.  90 tablet  3  . carvedilol (COREG) 25 MG tablet Take 1 tablet (25 mg total) by mouth 2 (two) times daily with a meal.  90 tablet  3  . HYDROcodone-acetaminophen (VICODIN ES) 7.5-750 MG per tablet Take 1 tablet by mouth every 6 (six) hours as needed. Patient used this medication for pain in his arm.      Marland Kitchen lisinopril-hydrochlorothiazide (PRINZIDE,ZESTORETIC) 20-12.5 MG per tablet Take 2 tablets by mouth daily.  180 tablet  3  . losartan (COZAAR) 100 MG tablet Take 1 tablet (100 mg total) by mouth daily.  30 tablet  0  . meloxicam (MOBIC) 15 MG tablet Take 1 tablet by mouth daily.      . mometasone (NASONEX) 50 MCG/ACT nasal spray Place 2 sprays into the nose daily as needed.  17 g  1  . Mometasone Furo-Formoterol Fum (DULERA) 100-5 MCG/ACT AERO Inhale  2 puffs into the lungs 2 (two) times daily.  1 Inhaler  6  . Multiple Vitamin (MULTIVITAMIN) capsule Take 1 capsule by mouth daily.          BP 150/90  Temp(Src) 98.8 F (37.1 C) (Oral)  Wt 327 lb (148.326 kg)chart    Objective:   Physical Exam  Constitutional: He is oriented to person, place, and time. He appears well-developed and well-nourished.  HENT:  Right Ear: External ear normal.  Left Ear: External ear normal.  Nose: Nose normal.  Mouth/Throat: Oropharynx is clear and moist.  Neck: Normal range of motion. Neck supple.  Cardiovascular: Normal rate, regular rhythm and normal heart sounds.   Pulmonary/Chest: Effort normal. He has wheezes.  Neurological: He is alert and oriented to person, place, and time.  Skin: Skin is warm and dry.  Psychiatric: He has a normal mood and affect.            Assessment & Plan:  Assessment: Bronchitis, Cough  Plan: Prednisone, Tussionex, and Zpak as directed. Call the office if symptoms worsen or persist.

## 2011-08-23 ENCOUNTER — Telehealth: Payer: Self-pay | Admitting: Internal Medicine

## 2011-08-23 DIAGNOSIS — I10 Essential (primary) hypertension: Secondary | ICD-10-CM

## 2011-08-23 MED ORDER — AMLODIPINE BESYLATE 10 MG PO TABS: 5.0000 mg | ORAL_TABLET | Freq: Every day | ORAL | Status: DC

## 2011-08-23 MED ORDER — CARVEDILOL 25 MG PO TABS: 25.0000 mg | ORAL_TABLET | Freq: Two times a day (BID) | ORAL | Status: DC

## 2011-08-23 NOTE — Telephone Encounter (Signed)
rx sent in electronically 

## 2011-08-23 NOTE — Telephone Encounter (Signed)
Patient called stating that he is switching from [facility] to [facility] [place] and need refills of carvedilol and amlodipine. Please assist.

## 2011-08-25 ENCOUNTER — Ambulatory Visit: Payer: Medicare PPO | Admitting: Internal Medicine

## 2011-09-25 ENCOUNTER — Telehealth: Payer: Self-pay | Admitting: Family Medicine

## 2011-09-25 MED ORDER — METHYLPREDNISOLONE 4 MG PO KIT: PACK | ORAL | Status: AC

## 2011-09-25 NOTE — Telephone Encounter (Signed)
Needs a steroid pack for breathing

## 2011-10-25 ENCOUNTER — Ambulatory Visit (INDEPENDENT_AMBULATORY_CARE_PROVIDER_SITE_OTHER): Payer: Medicare PPO | Admitting: Family Medicine

## 2011-10-25 ENCOUNTER — Encounter: Payer: Self-pay | Admitting: Family Medicine

## 2011-10-25 VITALS — BP 144/98 | HR 80 | Temp 98.5°F | Wt 319.0 lb

## 2011-10-25 DIAGNOSIS — J40 Bronchitis, not specified as acute or chronic: Secondary | ICD-10-CM

## 2011-10-25 DIAGNOSIS — J45901 Unspecified asthma with (acute) exacerbation: Secondary | ICD-10-CM

## 2011-10-25 MED ORDER — IPRATROPIUM-ALBUTEROL 0.5-2.5 (3) MG/3ML IN SOLN
3.0000 mL | Freq: Once | RESPIRATORY_TRACT | Status: AC
  Administered 2011-10-25: 3 mL via RESPIRATORY_TRACT

## 2011-10-25 MED ORDER — AZITHROMYCIN 250 MG PO TABS: ORAL_TABLET | ORAL | Status: AC

## 2011-10-25 MED ORDER — METHYLPREDNISOLONE ACETATE 80 MG/ML IJ SUSP
120.0000 mg | Freq: Once | INTRAMUSCULAR | Status: AC
  Administered 2011-10-25: 120 mg via INTRAMUSCULAR

## 2011-10-25 NOTE — Progress Notes (Signed)
  Subjective:    Patient ID: Tommy Jackson, male    DOB: April 25, 1955, 56 y.o.   MRN: [id number]  HPI Here for one week of sinus pressure, PND, ST, chest tightness, and coughing up green sputum. No fever. He has been out of [facility] for several months.    Review of Systems  Constitutional: Negative.   HENT: Positive for congestion, postnasal drip and sinus pressure.   Eyes: Negative.   Respiratory: Positive for cough, chest tightness, shortness of breath and wheezing.   Cardiovascular: Negative.        Objective:   Physical Exam  Constitutional: He appears well-developed and well-nourished.       Coughing with audible wheezing   HENT:  Right Ear: External ear normal.  Left Ear: External ear normal.  Nose: Nose normal.  Mouth/Throat: Oropharynx is clear and moist.  Eyes: Conjunctivae are normal.  Neck: No thyromegaly present.  Pulmonary/Chest: Effort normal. He has no rales.       Scattered wheezes and rhonchi   Lymphadenopathy:    He has no cervical adenopathy.          Assessment & Plan:  He has bronchitis and sinusitis on top of chronic asthma. Given samples to get back on [place] daily. Given a nebulizer treatment and a steroid shot. Recheck prn

## 2011-10-25 NOTE — Addendum Note (Signed)
Addended by: Aniceto Boss A on: 10/25/2011 12:05 PM   Modules accepted: Orders

## 2011-11-13 ENCOUNTER — Other Ambulatory Visit: Payer: Self-pay | Admitting: Internal Medicine

## 2011-11-30 ENCOUNTER — Emergency Department (HOSPITAL_BASED_OUTPATIENT_CLINIC_OR_DEPARTMENT_OTHER): Payer: Medicare PPO

## 2011-11-30 ENCOUNTER — Encounter (HOSPITAL_BASED_OUTPATIENT_CLINIC_OR_DEPARTMENT_OTHER): Payer: Self-pay | Admitting: *Deleted

## 2011-11-30 ENCOUNTER — Emergency Department (HOSPITAL_BASED_OUTPATIENT_CLINIC_OR_DEPARTMENT_OTHER)
Admission: EM | Admit: 2011-11-30 | Discharge: 2011-11-30 | Disposition: A | Discharge status: Home / Self Care | Payer: Medicare PPO | Attending: Emergency Medicine | Admitting: Emergency Medicine

## 2011-11-30 DIAGNOSIS — E669 Obesity, unspecified: Secondary | ICD-10-CM | POA: Insufficient documentation

## 2011-11-30 DIAGNOSIS — J45909 Unspecified asthma, uncomplicated: Secondary | ICD-10-CM | POA: Insufficient documentation

## 2011-11-30 DIAGNOSIS — I10 Essential (primary) hypertension: Secondary | ICD-10-CM | POA: Insufficient documentation

## 2011-11-30 DIAGNOSIS — Z79899 Other long term (current) drug therapy: Secondary | ICD-10-CM | POA: Insufficient documentation

## 2011-11-30 DIAGNOSIS — R079 Chest pain, unspecified: Secondary | ICD-10-CM | POA: Insufficient documentation

## 2011-11-30 LAB — COMPREHENSIVE METABOLIC PANEL
Albumin: 4.2 g/dL (ref 3.5–5.2)
BUN: 21 mg/dL (ref 6–23)
Calcium: 9.1 mg/dL (ref 8.4–10.5)
Chloride: 100 mEq/L (ref 96–112)
Creatinine, Ser: 1 mg/dL (ref 0.50–1.35)
Total Bilirubin: 0.4 mg/dL (ref 0.3–1.2)

## 2011-11-30 LAB — CBC WITH DIFFERENTIAL/PLATELET
Basophils Relative: 0 % (ref 0–1)
Eosinophils Absolute: 0.6 10*3/uL (ref 0.0–0.7)
Eosinophils Relative: 7 % — ABNORMAL HIGH (ref 0–5)
HCT: 40.5 % (ref 39.0–52.0)
Hemoglobin: 14.1 g/dL (ref 13.0–17.0)
MCH: 31.8 pg (ref 26.0–34.0)
MCHC: 34.8 g/dL (ref 30.0–36.0)
MCV: 91.2 fL (ref 78.0–100.0)
Monocytes Absolute: 0.8 10*3/uL (ref 0.1–1.0)
Monocytes Relative: 9 % (ref 3–12)
Neutro Abs: 4.5 10*3/uL (ref 1.7–7.7)

## 2011-11-30 LAB — TROPONIN I: Troponin I: <0.3 ng/mL (ref <0.30)

## 2011-11-30 NOTE — Discharge Instructions (Signed)
Chest Pain (Nonspecific) It is often hard to give a specific diagnosis for the cause of chest pain. There is always a chance that your pain could be related to something serious, such as a heart attack or a blood clot in the lungs. You need to follow up with your caregiver for further evaluation. CAUSES   Heartburn.  Pneumonia or bronchitis.  Anxiety or stress.  Inflammation around your heart (pericarditis) or lung (pleuritis or pleurisy).  A blood clot in the lung.  A collapsed lung (pneumothorax). It can develop suddenly on its own (spontaneous pneumothorax) or from injury (trauma) to the chest.  Shingles infection (herpes zoster virus). The chest wall is composed of bones, muscles, and cartilage. Any of these can be the source of the pain.  The bones can be bruised by injury.  The muscles or cartilage can be strained by coughing or overwork.  The cartilage can be affected by inflammation and become sore (costochondritis). DIAGNOSIS  Lab tests or other studies, such as X-rays, electrocardiography, stress testing, or cardiac imaging, may be needed to find the cause of your pain.  TREATMENT   Treatment depends on what may be causing your chest pain. Treatment may include:  Acid blockers for heartburn.  Anti-inflammatory medicine.  Pain medicine for inflammatory conditions.  Antibiotics if an infection is present.  You may be advised to change lifestyle habits. This includes stopping smoking and avoiding alcohol, caffeine, and chocolate.  You may be advised to keep your head raised (elevated) when sleeping. This reduces the chance of acid going backward from your stomach into your esophagus.  Most of the time, nonspecific chest pain will improve within 2 to 3 days with rest and mild pain medicine. HOME CARE INSTRUCTIONS   If antibiotics were prescribed, take your antibiotics as directed. Finish them even if you start to feel better.  For the next few days, avoid physical  activities that bring on chest pain. Continue physical activities as directed.  Do not smoke.  Avoid drinking alcohol.  Only take over-the-counter or prescription medicine for pain, discomfort, or fever as directed by your caregiver.  Follow your caregiver's suggestions for further testing if your chest pain does not go away.  Keep any follow-up appointments you made. If you do not go to an appointment, you could develop lasting (chronic) problems with pain. If there is any problem keeping an appointment, you must call to reschedule. SEEK MEDICAL CARE IF:   You think you are having problems from the medicine you are taking. Read your medicine instructions carefully.  Your chest pain does not go away, even after treatment.  You develop a rash with blisters on your chest. SEEK IMMEDIATE MEDICAL CARE IF:   You have increased chest pain or pain that spreads to your arm, neck, jaw, back, or abdomen.  You develop shortness of breath, an increasing cough, or you are coughing up blood.  You have severe back or abdominal pain, feel nauseous, or vomit.  You develop severe weakness, fainting, or chills.  You have a fever. THIS IS AN EMERGENCY. Do not wait to see if the pain will go away. Get medical help at once. Call your local emergency services (911 in [place] Do not drive yourself to the hospital. MAKE SURE YOU:   Understand these instructions.  Will watch your condition.  Will get help right away if you are not doing well or get worse. Document Released: 11/23/2004 Document Revised: 05/08/2011 Document Reviewed: 09/19/2007 [employer] Patient Information 2013 [employer].  

## 2011-11-30 NOTE — ED Notes (Signed)
Pt reports having left arm pain and bilateral weakness in both arms and legs yesterday. Pt has a history of a herniated disc and reports intermittent posterior neck pain. States that he has an appt with his neurologist in three weeks but wanted to come to the ER for further evaluation. Pt able to move both arms without difficulty and both legs without difficulty while in triage.

## 2011-11-30 NOTE — ED Provider Notes (Signed)
History     CSN: [id number]  Arrival date & time 11/30/11  1941   First MD Initiated Contact with Patient 11/30/11 2012      No chief complaint on file.   (Consider location/radiation/quality/duration/timing/severity/associated sxs/prior treatment) HPI Comments: The patient presents here complaining of tightness in left shoulder that occurred several days ago while sleeping on a pull out mattress.  Today he noticed he felt weak, fatigued, and heaviness in both arms while he was taking a shower.  He also reports that he has not had the same stamina as he has had in the past.  He denies to me having had chest pains, cough, or fever.    The history is provided by the patient.    Past Medical History  Diagnosis Date  . HYPERTENSION 11/29/2005  . HYPERLIPIDEMIA 02/07/2007  . OBESITY 10/01/2007  . SLEEP APNEA, OBSTRUCTIVE 10/01/2007  . ASTHMA 09/07/2009  . Herniated disc     Past Surgical History  Procedure Date  . Shoulder surgery     Family History  Problem Relation Age of Onset  . Colon cancer Father   . Lung cancer Father   . Heart disease Father   . Diabetes Father   . Heart attack Father   . Cancer Father     colon/ lung  . Colon cancer Brother   . Cancer Brother 36    colon    History  Substance Use Topics  . Smoking status: Never Smoker   . Smokeless tobacco: Never Used  . Alcohol Use: Yes     occ      Review of Systems  All other systems reviewed and are negative.    Allergies  Aspirin and Penicillins  Home Medications   Current Outpatient Rx  Name Route Sig Dispense Refill  . AMLODIPINE BESYLATE 10 MG PO TABS Oral Take 0.5 tablets (5 mg total) by mouth daily. 90 tablet 3  . CARVEDILOL 25 MG PO TABS Oral Take 1 tablet (25 mg total) by mouth 2 (two) times daily with a meal. 90 tablet 3  . HYDROCODONE-ACETAMINOPHEN 7.5-750 MG PO TABS Oral Take 1 tablet by mouth every 6 (six) hours as needed. Patient used this medication for pain in his arm.    Marland Kitchen  LOSARTAN POTASSIUM 100 MG PO TABS Oral Take 1 tablet (100 mg total) by mouth daily. 30 tablet 0  . MELOXICAM 15 MG PO TABS Oral Take 1 tablet by mouth daily.    . MOMETASONE FURO-FORMOTEROL FUM 100-5 MCG/ACT IN AERO Inhalation Inhale 2 puffs into the lungs 2 (two) times daily. 1 Inhaler 6  . MULTIVITAMINS PO CAPS Oral Take 1 capsule by mouth daily.      . ALBUTEROL SULFATE HFA 108 (90 BASE) MCG/ACT IN AERS Inhalation Inhale 2 puffs into the lungs every 4 (four) hours as needed.      Marland Kitchen HYDROCOD POLST-CPM POLST ER 10-8 MG/5ML PO LQCR Oral Take 5 mLs by mouth every 12 (twelve) hours as needed. 140 mL 0  . MOMETASONE FUROATE 50 MCG/ACT NA SUSP Nasal Place 2 sprays into the nose daily as needed. 17 g 1    BP 150/107  Temp 98.3 F (36.8 C) (Oral)  Resp 20  Ht 6' (1.829 m)  Wt 325 lb (147.419 kg)  BMI 44.08 kg/m2  SpO2 98%  Physical Exam  Nursing note and vitals reviewed. Constitutional: He is oriented to person, place, and time. He appears well-developed and well-nourished. No distress.  HENT:  Head: Normocephalic  and atraumatic.  Mouth/Throat: Oropharynx is clear and moist.  Neck: Normal range of motion. Neck supple.  Cardiovascular: Normal rate and regular rhythm.   No murmur heard. Pulmonary/Chest: Effort normal and breath sounds normal. No respiratory distress. He has no wheezes.  Abdominal: Soft. Bowel sounds are normal. He exhibits no distension. There is no tenderness.       The patient is obese.    Musculoskeletal: Normal range of motion. He exhibits no edema.  Neurological: He is alert and oriented to person, place, and time.  Skin: Skin is warm and dry. He is not diaphoretic.    ED Course  Procedures (including critical care time)  Labs Reviewed - No data to display No results found.   No diagnosis found.   Date: 11/30/2011  Rate: 71  Rhythm: normal sinus rhythm  QRS Axis: normal  Intervals: normal  ST/T Wave abnormalities: normal  Conduction Disutrbances:none   Narrative Interpretation:   Old EKG Reviewed: none available    MDM  The patient presents here with symptoms atypical for cardiac pain, but multiple risk factors.  The workup tonight is negative, but I feel should have cardiology follow up as an outpatient.  He will be given the contact information for [facility] and agrees to return if his symptoms worsen.        Geoffery Lyons, MD 11/30/11 2223

## 2011-11-30 NOTE — ED Notes (Signed)
MD at bedside. 

## 2011-11-30 NOTE — ED Notes (Signed)
Pt able to ambulate without difficulty to bathroom,

## 2011-12-04 ENCOUNTER — Other Ambulatory Visit: Payer: Self-pay | Admitting: Internal Medicine

## 2011-12-06 ENCOUNTER — Encounter: Payer: Self-pay | Admitting: Family

## 2011-12-06 ENCOUNTER — Ambulatory Visit (INDEPENDENT_AMBULATORY_CARE_PROVIDER_SITE_OTHER): Payer: Medicare PPO | Admitting: Family

## 2011-12-06 VITALS — BP 140/100 | HR 77 | Wt 317.3 lb

## 2011-12-06 DIAGNOSIS — I10 Essential (primary) hypertension: Secondary | ICD-10-CM

## 2011-12-06 DIAGNOSIS — Z8249 Family history of ischemic heart disease and other diseases of the circulatory system: Secondary | ICD-10-CM

## 2011-12-06 DIAGNOSIS — R079 Chest pain, unspecified: Secondary | ICD-10-CM

## 2011-12-06 DIAGNOSIS — R062 Wheezing: Secondary | ICD-10-CM

## 2011-12-06 MED ORDER — LOSARTAN POTASSIUM-HCTZ 100-12.5 MG PO TABS: 1.0000 | ORAL_TABLET | Freq: Every day | ORAL | Status: DC

## 2011-12-06 NOTE — Progress Notes (Signed)
Subjective:    Patient ID: Tommy Jackson, male    DOB: [birth year], 56 y.o.   MRN: [id number]  HPI 56 year old white male, nonsmoker, obese patient of Dr. Cato Mulligan is in today as an emergency department followup. He was seen in the emergency department on 11/30/2011 with left arm pain. Cardiac etiology was ruled out, but we'll suggested he followup with cardiologist due to his family history. Continues to have discomfort in his left shoulder that is improving. He denies any injury. Patient has a history of chronic low back pain that is worsening. He is scheduled to see the pain clinic tomorrow. Patient also has hypertension that has been uncontrolled. Blood pressure readings outside the office have been between 160 and 180 over 100s. He has taken his current blood pressure medications daily.    Review of Systems  Constitutional: Negative.   HENT: Negative.   Respiratory: Negative.   Cardiovascular: Negative.   Gastrointestinal: Negative.   Genitourinary: Negative.   Musculoskeletal: Negative.   Skin: Negative.   Neurological: Negative.   Hematological: Negative.   Psychiatric/Behavioral: Negative.    Past Medical History  Diagnosis Date  . HYPERTENSION 11/29/2005  . HYPERLIPIDEMIA 02/07/2007  . OBESITY 10/01/2007  . SLEEP APNEA, OBSTRUCTIVE 10/01/2007  . ASTHMA 09/07/2009  . Herniated disc     History   Social History  . Marital Status: Single    Spouse Name: N/A    Number of Children: N/A  . Years of Education: N/A   Occupational History  . retired truck Mining engineer term disability    Social History Main Topics  . Smoking status: Never Smoker   . Smokeless tobacco: Never Used  . Alcohol Use: Yes     occ  . Drug Use: No  . Sexually Active: Yes   Other Topics Concern  . Not on file   Social History Narrative  . No narrative on file    Past Surgical History  Procedure Date  . Shoulder surgery     Family History  Problem Relation Age of Onset  . Colon cancer  Father   . Lung cancer Father   . Heart disease Father   . Diabetes Father   . Heart attack Father   . Cancer Father     colon/ lung  . Colon cancer Brother   . Cancer Brother 25    colon    Allergies  Allergen Reactions  . Aspirin     REACTION: unspecified  . Penicillins     REACTION: family hx    Current Outpatient Prescriptions on File Prior to Visit  Medication Sig Dispense Refill  . albuterol (VENTOLIN HFA) 108 (90 BASE) MCG/ACT inhaler Inhale 2 puffs into the lungs every 4 (four) hours as needed.        Marland Kitchen amLODipine (NORVASC) 10 MG tablet Take 0.5 tablets (5 mg total) by mouth daily.  90 tablet  3  . carvedilol (COREG) 25 MG tablet Take 1 tablet (25 mg total) by mouth 2 (two) times daily with a meal.  90 tablet  3  . chlorpheniramine-HYDROcodone (TUSSIONEX PENNKINETIC ER) 10-8 MG/5ML LQCR Take 5 mLs by mouth every 12 (twelve) hours as needed.  140 mL  0  . HYDROcodone-acetaminophen (VICODIN ES) 7.5-750 MG per tablet Take 1 tablet by mouth every 6 (six) hours as needed. Patient used this medication for pain in his arm.      . losartan (COZAAR) 100 MG tablet Take 1 tablet (100 mg total) by mouth daily.  30 tablet  0  . meloxicam (MOBIC) 15 MG tablet Take 1 tablet by mouth daily.      . mometasone (NASONEX) 50 MCG/ACT nasal spray Place 2 sprays into the nose daily as needed.  17 g  1  . Mometasone Furo-Formoterol Fum (DULERA) 100-5 MCG/ACT AERO Inhale 2 puffs into the lungs 2 (two) times daily.  1 Inhaler  6  . Multiple Vitamin (MULTIVITAMIN) capsule Take 1 capsule by mouth daily.        Marland Kitchen losartan-hydrochlorothiazide (HYZAAR) 100-12.5 MG per tablet Take 1 tablet by mouth daily.  90 tablet  3    BP 140/100  Pulse 77  Wt 317 lb 4.8 oz (143.926 kg)  SpO2 96%chart    Objective:   Physical Exam  Constitutional: He is oriented to person, place, and time. He appears well-developed and well-nourished.  HENT:  Right Ear: External ear normal.  Left Ear: External ear normal.    Nose: Nose normal.  Mouth/Throat: Oropharynx is clear and moist.  Neck: Normal range of motion. Neck supple.  Cardiovascular: Normal rate, regular rhythm and normal heart sounds.   Pulmonary/Chest: Effort normal and breath sounds normal.  Abdominal: Soft. Bowel sounds are normal.  Musculoskeletal: Normal range of motion.  Neurological: He is alert and oriented to person, place, and time.  Skin: Skin is warm and dry.  Psychiatric: He has a normal mood and affect.          Assessment & Plan:  Assessment: Uncontrolled hypertension, chest pain-improved, low back pain-worsening, family history of MI  Plan: Refer to cardiology for further workup. DC Cozaar and start Hyzaar 100/12.5 once daily. Continue current medications. Followup here in 2 weeks for recheck of blood pressure. See pain clinic as scheduled tomorrow. Otherwise continue all other medications the same.

## 2011-12-06 NOTE — Patient Instructions (Addendum)
Sodium-Controlled Diet  Sodium is a mineral. It is found in many foods. Sodium may be found naturally or added during the making of a food. The most common form of sodium is salt, which is made up of sodium and chloride. Reducing your sodium intake involves changing your eating habits.  The following guidelines will help you reduce the sodium in your diet:   Stop using the salt shaker.   Use salt sparingly in cooking and baking.   Substitute with sodium-free seasonings and spices.   Do not use a salt substitute (potassium chloride) without your caregiver's permission.   Include a variety of fresh, unprocessed foods in your diet.   Limit the use of processed and convenience foods that are high in sodium.  USE THE FOLLOWING FOODS SPARINGLY:  Breads/Starches   Commercial bread stuffing, commercial pancake or waffle mixes, coating mixes. Waffles. Croutons. Prepared (boxed or frozen) potato, rice, or noodle mixes that contain salt or sodium. Salted French fries or hash browns. Salted popcorn, breads, crackers, chips, or snack foods.  Vegetables   Vegetables canned with salt or prepared in cream, butter, or cheese sauces. Sauerkraut. Tomato or vegetable juices canned with salt.   Fresh vegetables are allowed if rinsed thoroughly.  Fruit   Fruit is okay to eat.  Meat and Meat Substitutes   Salted or smoked meats, such as bacon or Canadian bacon, chipped or corned beef, hot dogs, salt pork, luncheon meats, pastrami, ham, or sausage. Canned or smoked fish, poultry, or meat. Processed cheese or cheese spreads, blue or Roquefort cheese. Battered or frozen fish products. Prepared spaghetti sauce. Baked beans. Reuben sandwiches. Salted nuts. Caviar.  Milk   Limit buttermilk to 1 cup per week.  Soups and Combination Foods    Bouillon cubes, canned or dried soups, broth, consomm. Convenience (frozen or packaged) dinners with more than 600 mg sodium. Pot pies, pizza, Asian food, fast food cheeseburgers, and specialty sandwiches.  Desserts and Sweets   Regular (salted) desserts, pie, commercial fruit snack pies, commercial snack cakes, canned puddings.   Eat desserts and sweets in moderation.  Fats and Oils   Gravy mixes or canned gravy. No more than 1 to 2 tbs of salad dressing. Chip dips.   Eat fats and oils in moderation.  Beverages   See those listed under the vegetables and milk groups.  Condiments   Ketchup, mustard, meat sauces, salsa, regular (salted) and lite soy sauce or mustard. Dill pickles, olives, meat tenderizer. Prepared horseradish or pickle relish. Dutch-processed cocoa. Baking powder or baking soda used medicinally. Worcestershire sauce. "Light" salt. Salt substitute, unless approved by your caregiver.  Document Released: 08/05/2001 Document Revised: 05/08/2011 Document Reviewed: 03/08/2009  [employer] Patient Information 2013 [employer].

## 2011-12-15 ENCOUNTER — Ambulatory Visit (INDEPENDENT_AMBULATORY_CARE_PROVIDER_SITE_OTHER): Payer: Medicare PPO | Admitting: Cardiovascular Disease

## 2011-12-15 ENCOUNTER — Encounter: Payer: Self-pay | Admitting: Cardiovascular Disease

## 2011-12-15 VITALS — BP 152/90 | HR 76 | Ht 72.0 in | Wt 317.0 lb

## 2011-12-15 DIAGNOSIS — R079 Chest pain, unspecified: Secondary | ICD-10-CM

## 2011-12-15 NOTE — Progress Notes (Signed)
History of Present Illness: 56 yo white male with history of HTN, HLD, OSA, asthma who is here today for new patient evaluation. He has no prior history of cardiac disease but was seen in the [facility] ED 11/30/11 with left shoulder tightness/pain and fatigue. He also noticed heaviness in his arms. No chest pain or SOB.  EKG without ischemic changes. Chest x-ray was normal. Troponin was negative. He has since been seen by primary care and based on his risk factors for CAD and family history of CAD, he is referred for further evaluation.   He tells me today that he gets dyspneic very easily. He has had aching type pain had no chest pain but some pain in left shoulder and arm. No swelling in ankles. He gets very little exercise. He cannot walk far secondary to back pain. He has weakness in his legs.   Primary Care Physician: [name]  Past Medical History  Diagnosis Date  . HYPERTENSION 11/29/2005  . HYPERLIPIDEMIA 02/07/2007  . OBESITY 10/01/2007  . SLEEP APNEA, OBSTRUCTIVE 10/01/2007  . ASTHMA 09/07/2009  . Herniated disc     Past Surgical History  Procedure Date  . Shoulder surgery     right  . Elbow surgery     right    Current Outpatient Prescriptions  Medication Sig Dispense Refill  . albuterol (VENTOLIN HFA) 108 (90 BASE) MCG/ACT inhaler Inhale 2 puffs into the lungs every 4 (four) hours as needed.        Marland Kitchen amLODipine (NORVASC) 10 MG tablet Take 0.5 tablets (5 mg total) by mouth daily.  90 tablet  3  . chlorpheniramine-HYDROcodone (TUSSIONEX PENNKINETIC ER) 10-8 MG/5ML LQCR Take 5 mLs by mouth every 12 (twelve) hours as needed.  140 mL  0  . HYDROcodone-acetaminophen (VICODIN ES) 7.5-750 MG per tablet Take 1 tablet by mouth every 6 (six) hours as needed. Patient used this medication for pain in his arm.      . losartan-hydrochlorothiazide (HYZAAR) 100-12.5 MG per tablet Take 1 tablet by mouth daily.  90 tablet  3  . meloxicam (MOBIC) 15 MG tablet Take 1 tablet by  mouth daily.      . mometasone (NASONEX) 50 MCG/ACT nasal spray Place 2 sprays into the nose daily as needed.  17 g  1  . Mometasone Furo-Formoterol Fum (DULERA) 100-5 MCG/ACT AERO Inhale 2 puffs into the lungs 2 (two) times daily.  1 Inhaler  6  . Multiple Vitamin (MULTIVITAMIN) capsule Take 1 capsule by mouth daily.        Marland Kitchen DISCONTD: losartan-hydrochlorothiazide (HYZAAR) 100-12.5 MG per tablet Take 1 tablet by mouth daily.        Allergies  Allergen Reactions  . Aspirin     REACTION: unspecified  . Penicillins     REACTION: family hx    History   Social History  . Marital Status: Single    Spouse Name: N/A    Number of Children: 3  . Years of Education: N/A   Occupational History  . retired truck Mining engineer term disability    Social History Main Topics  . Smoking status: Never Smoker   . Smokeless tobacco: Never Used  . Alcohol Use: 0.5 oz/week    1 drink(s) per week     occ  . Drug Use: No  . Sexually Active: Yes   Other Topics Concern  . Not on file   Social History Narrative  . No narrative on file  Family History  Problem Relation Age of Onset  . Colon cancer Father   . Lung cancer Father   . CAD Father   . Diabetes Father   . Heart attack Father   . Colon cancer Brother   . Heart attack Paternal Grandfather   . CAD Brother     Review of Systems:  As stated in the HPI and otherwise negative.   BP 152/90  Pulse 76  Ht 6' (1.829 m)  Wt 317 lb (143.79 kg)  BMI 42.99 kg/m2  Physical Examination: General: Well developed, well nourished, NAD HEENT: OP clear, mucus membranes moist SKIN: warm, dry. No rashes. Neuro: No focal deficits Musculoskeletal: Muscle strength 5/5 all ext Psychiatric: Mood and affect normal Neck: No JVD, no carotid bruits, no thyromegaly, no lymphadenopathy. Lungs:Clear bilaterally, no wheezes, rhonci, crackles Cardiovascular: Regular rate and rhythm. No murmurs, gallops or rubs. Abdomen:Obese. Soft. Bowel sounds  present. Non-tender.  Extremities: No lower extremity edema. Pulses are 2 + in the bilateral DP/PT.  EKG: 11/30/11: NSR, normal EKG.   Assessment and Plan:   1. Chest pain/Left shoulder pain: His chest pain does not sound like angina but he does have multiple cardiac risk factors including HTN, HLD, obesity, sedentary lifestyle, strong family history of CAD in GF, father and brother. I think a stress test is indicated. Will arrange Lexiscan stress myoview to exclude ischemia. Echo to exclude structural heart disease.

## 2011-12-15 NOTE — Patient Instructions (Addendum)
Your physician recommends that you schedule a follow-up appointment in:  4 weeks.   Your physician has requested that you have an echocardiogram. Echocardiography is a painless test that uses sound waves to create images of your heart. It provides your doctor with information about the size and shape of your heart and how well your heart's chambers and valves are working. This procedure takes approximately one hour. There are no restrictions for this procedure.  Your physician has requested that you have a lexiscan myoview. For further information please visit [email or web address]. Please follow instruction sheet, as given.   

## 2011-12-20 ENCOUNTER — Ambulatory Visit: Payer: Medicare PPO | Admitting: Family

## 2011-12-21 ENCOUNTER — Ambulatory Visit (HOSPITAL_COMMUNITY): Payer: Medicare PPO | Admitting: Radiology

## 2011-12-21 ENCOUNTER — Encounter (HOSPITAL_COMMUNITY): Payer: Self-pay | Admitting: Radiology

## 2011-12-21 ENCOUNTER — Ambulatory Visit (HOSPITAL_COMMUNITY): Payer: Medicare PPO | Attending: Cardiovascular Disease | Admitting: Radiology

## 2011-12-21 VITALS — BP 159/101 | HR 70

## 2011-12-21 VITALS — BP 159/101 | HR 70 | Ht 72.0 in | Wt 317.0 lb

## 2011-12-21 DIAGNOSIS — R0602 Shortness of breath: Secondary | ICD-10-CM

## 2011-12-21 DIAGNOSIS — E785 Hyperlipidemia, unspecified: Secondary | ICD-10-CM

## 2011-12-21 DIAGNOSIS — I209 Angina pectoris, unspecified: Secondary | ICD-10-CM

## 2011-12-21 DIAGNOSIS — R0989 Other specified symptoms and signs involving the circulatory and respiratory systems: Secondary | ICD-10-CM | POA: Insufficient documentation

## 2011-12-21 DIAGNOSIS — I10 Essential (primary) hypertension: Secondary | ICD-10-CM | POA: Insufficient documentation

## 2011-12-21 DIAGNOSIS — R0609 Other forms of dyspnea: Secondary | ICD-10-CM | POA: Insufficient documentation

## 2011-12-21 DIAGNOSIS — R079 Chest pain, unspecified: Secondary | ICD-10-CM

## 2011-12-21 DIAGNOSIS — Z8249 Family history of ischemic heart disease and other diseases of the circulatory system: Secondary | ICD-10-CM | POA: Insufficient documentation

## 2011-12-21 MED ORDER — TECHNETIUM TC 99M SESTAMIBI GENERIC - CARDIOLITE
33.0000 | Freq: Once | INTRAVENOUS | Status: AC | PRN
  Administered 2011-12-21: 33 via INTRAVENOUS

## 2011-12-21 MED ORDER — REGADENOSON 0.4 MG/5ML IV SOLN
0.4000 mg | Freq: Once | INTRAVENOUS | Status: AC
  Administered 2011-12-21: 0.4 mg via INTRAVENOUS

## 2011-12-21 NOTE — Addendum Note (Signed)
Addended by: Smiley Houseman C on: 12/21/2011 03:01 PM   Modules accepted: Level of Service, SmartSet

## 2011-12-21 NOTE — Progress Notes (Signed)
[facility] SITE 3 NUCLEAR MED [address] [id number] [place] [address] [phone]  Cardiology Nuclear Med Study  Tommy Jackson is a 56 y.o. male     MRN : [id number]     DOB: 1955/03/03  Procedure Date: 12/21/2011  Nuclear Med Background Indication for Stress Test:  Evaluation for Ischemia and Post ED on 12/10/11 with (L) Shoulder Tightness, Negative Troponin History:  No previously documented CAD. Cardiac Risk Factors: Family History - CAD, History of Smoking, Hypertension, Lipids and Obesity  Symptoms:  DOE, Fatigue and (L) Shoulder tightness, Arms Heavy (Last episode of discomfort OZD:GUYQ since discharge from hospital)   Nuclear Pre-Procedure Caffeine/Decaff Intake:  None NPO After: 8:30am   Lungs:  Clear. O2 Sat: 95% on room air. IV 0.9% NS with Angio Cath:  20g  IV Site: R Hand  IV Started by:  Bonnita Levan, RN  Chest Size (in):  56 Cup Size: n/a  Height: 6' (1.829 m)  Weight:  317 lb (143.79 kg)  BMI:  Body mass index is 42.99 kg/(m^2). Tech Comments:  n/a    Nuclear Med Study 1 or 2 day study: 2 day  Stress Test Type:  Lexiscan  Reading MD: Kristeen Miss, MD  Order Authorizing Provider:  Verne Carrow, MD  Resting Radionuclide: Technetium 17m Sestamibi  Resting Radionuclide Dose: 33.0 mCi on 12/26/11   Stress Radionuclide:  Technetium 20m Sestamibi  Stress Radionuclide Dose: 33.0 mCi on 12/21/11           Stress Protocol Rest HR: 70 Stress HR: 97  Rest BP: 159/101 Stress BP: 167/109 during Lexiscan      Post Lexiscan BP:181/116  Exercise Time (min): n/a METS: n/a   Predicted Max HR: 165 bpm % Max HR: 58.79 bpm Rate Pressure Product: 03474   Dose of Adenosine (mg):  n/a Dose of Lexiscan: 0.4 mg  Dose of Atropine (mg): n/a Dose of Dobutamine: n/a mcg/kg/min (at max HR)  Stress Test Technologist: Smiley Houseman, CMA-N  Nuclear Technologist:  Domenic Polite, CNMT     Rest Procedure:  Myocardial perfusion imaging was  performed at rest 45 minutes following the intravenous administration of Technetium 59m Sestamibi.  Rest ECG: No acute changes with occasional PVC's.  Stress Procedure:  The patient received IV Lexiscan 0.4 mg over 15-seconds.  Technetium 77m Sestamibi was injected at 30-seconds.  There were no significant changes with Lexiscan.  Patient was very hypertensive in recovery phase, 181/116.  Rechecked after stress images he was down to 140/94.  Quantitative spect images were obtained after a 45 minute delay.  Stress ECG: No significant ST segment change suggestive of ischemia.  QPS Raw Data Images:  Acquisition technically good; LVE. Stress Images:  There is decreased uptake in the inferior wall. Rest Images:  There is decreased uptake in the inferior wall. Subtraction (SDS):  No evidence of ischemia. Transient Ischemic Dilatation (Normal <1.22):  1.00 Lung/Heart Ratio (Normal <0.45):  0.30  Quantitative Gated Spect Images QGS EDV:  140 ml QGS ESV:  62 ml  Impression Exercise Capacity:  Lexiscan with no exercise. BP Response:  Hypertensive blood pressure response. Clinical Symptoms:  There is dyspnea. ECG Impression:  No significant ST segment change suggestive of ischemia. Comparison with Prior Nuclear Study: No previous nuclear study performed  Overall Impression:  Normal stress nuclear study with a small, mild, fixed inferior defect consistent with diaphragmatic attenuation; no ischemia.  LV Ejection Fraction: 56%.  LV Wall Motion:  NL LV Function; NL Wall Motion  Tommy Jackson  .

## 2011-12-26 ENCOUNTER — Other Ambulatory Visit (HOSPITAL_COMMUNITY): Payer: Self-pay | Admitting: Cardiology

## 2011-12-26 ENCOUNTER — Ambulatory Visit (HOSPITAL_COMMUNITY): Payer: Medicare PPO | Attending: Cardiology | Admitting: Radiology

## 2011-12-26 ENCOUNTER — Ambulatory Visit (HOSPITAL_BASED_OUTPATIENT_CLINIC_OR_DEPARTMENT_OTHER): Payer: Medicare PPO | Admitting: Radiology

## 2011-12-26 DIAGNOSIS — R0789 Other chest pain: Secondary | ICD-10-CM

## 2011-12-26 DIAGNOSIS — I10 Essential (primary) hypertension: Secondary | ICD-10-CM | POA: Insufficient documentation

## 2011-12-26 DIAGNOSIS — I369 Nonrheumatic tricuspid valve disorder, unspecified: Secondary | ICD-10-CM | POA: Insufficient documentation

## 2011-12-26 DIAGNOSIS — R072 Precordial pain: Secondary | ICD-10-CM | POA: Insufficient documentation

## 2011-12-26 DIAGNOSIS — R0989 Other specified symptoms and signs involving the circulatory and respiratory systems: Secondary | ICD-10-CM

## 2011-12-26 DIAGNOSIS — R079 Chest pain, unspecified: Secondary | ICD-10-CM

## 2011-12-26 DIAGNOSIS — I059 Rheumatic mitral valve disease, unspecified: Secondary | ICD-10-CM | POA: Insufficient documentation

## 2011-12-26 MED ORDER — TECHNETIUM TC 99M SESTAMIBI GENERIC - CARDIOLITE
33.0000 | Freq: Once | INTRAVENOUS | Status: AC | PRN
  Administered 2011-12-26: 33 via INTRAVENOUS

## 2011-12-26 MED ORDER — PERFLUTREN LIPID MICROSPHERE
3.0000 mL | INTRAVENOUS | Status: DC | PRN
  Administered 2011-12-26 (×2): 3 mL via INTRAVENOUS

## 2011-12-26 NOTE — Progress Notes (Signed)
Echocardiogram performed with optison.  

## 2012-01-05 ENCOUNTER — Ambulatory Visit: Payer: Medicare PPO | Admitting: Internal Medicine

## 2012-01-19 ENCOUNTER — Other Ambulatory Visit: Payer: Self-pay | Admitting: *Deleted

## 2012-01-19 ENCOUNTER — Ambulatory Visit (INDEPENDENT_AMBULATORY_CARE_PROVIDER_SITE_OTHER): Payer: Medicare PPO | Admitting: Cardiovascular Disease

## 2012-01-19 ENCOUNTER — Encounter: Payer: Self-pay | Admitting: Cardiovascular Disease

## 2012-01-19 VITALS — BP 158/108 | HR 88 | Ht 72.0 in | Wt 320.0 lb

## 2012-01-19 DIAGNOSIS — I119 Hypertensive heart disease without heart failure: Secondary | ICD-10-CM

## 2012-01-19 MED ORDER — CARVEDILOL 25 MG PO TABS: 25.0000 mg | ORAL_TABLET | Freq: Two times a day (BID) | ORAL | Status: DC

## 2012-01-19 MED ORDER — CARVEDILOL 3.125 MG PO TABS: 3.1250 mg | ORAL_TABLET | Freq: Two times a day (BID) | ORAL | Status: DC

## 2012-01-19 NOTE — Progress Notes (Signed)
History of Present Illness: 56 yo white male with history of HTN, HLD, OSA, asthma who is here today for cardiac follow up.  He has no prior history of cardiac disease but was seen in the [facility] ED 11/30/11 with left shoulder tightness/pain and fatigue. He also noticed heaviness in his arms. No chest pain or SOB. EKG without ischemic changes. Chest x-ray was normal. Troponin was negative. At the first visit, he told me that he gets dyspneic very easily. He has had aching type pain had no chest pain but some pain in left shoulder and arm. No swelling in ankles. He gets very little exercise. He cannot walk far secondary to back pain. He has weakness in his legs. I arranged an echo and a Lexiscan myoview. Stress test without ischemia. Echo with moderate LVH, no valvular disease, normal LV function.   He is feeling well today. No complaints. No chest pain or SOB.   Primary Care Physician: [name]  Last Lipid Profile:Lipid Panel     Component Value Date/Time   CHOL 187 06/08/2009 0942   TRIG 150.0* 06/08/2009 0942   HDL 56.80 06/08/2009 0942   CHOLHDL 3 06/08/2009 0942   VLDL 30.0 06/08/2009 0942   LDLCALC 100* 06/08/2009 0942     Past Medical History  Diagnosis Date  . HYPERTENSION 11/29/2005  . HYPERLIPIDEMIA 02/07/2007  . OBESITY 10/01/2007  . SLEEP APNEA, OBSTRUCTIVE 10/01/2007  . ASTHMA 09/07/2009  . Herniated disc     Past Surgical History  Procedure Date  . Shoulder surgery     right  . Elbow surgery     right    Current Outpatient Prescriptions  Medication Sig Dispense Refill  . albuterol (VENTOLIN HFA) 108 (90 BASE) MCG/ACT inhaler Inhale 2 puffs into the lungs every 4 (four) hours as needed.        Marland Kitchen amLODipine (NORVASC) 10 MG tablet Take 0.5 tablets (5 mg total) by mouth daily.  90 tablet  3  . chlorpheniramine-HYDROcodone (TUSSIONEX PENNKINETIC ER) 10-8 MG/5ML LQCR Take 5 mLs by mouth every 12 (twelve) hours as needed.  140 mL  0  .  HYDROcodone-acetaminophen (VICODIN ES) 7.5-750 MG per tablet Take 1 tablet by mouth every 6 (six) hours as needed. Patient used this medication for pain in his arm.      . losartan-hydrochlorothiazide (HYZAAR) 100-12.5 MG per tablet Take 1 tablet by mouth daily.  90 tablet  3  . meloxicam (MOBIC) 15 MG tablet Take 1 tablet by mouth daily.      . mometasone (NASONEX) 50 MCG/ACT nasal spray Place 2 sprays into the nose daily as needed.  17 g  1  . Mometasone Furo-Formoterol Fum (DULERA) 100-5 MCG/ACT AERO Inhale 2 puffs into the lungs 2 (two) times daily.  1 Inhaler  6  . Multiple Vitamin (MULTIVITAMIN) capsule Take 1 capsule by mouth daily.        . carvedilol (COREG) 25 MG tablet Take 1 tablet (25 mg total) by mouth 2 (two) times daily with a meal.  60 tablet  11    Allergies  Allergen Reactions  . Aspirin     REACTION: unspecified  . Penicillins     REACTION: family hx    History   Social History  . Marital Status: Single    Spouse Name: N/A    Number of Children: 3  . Years of Education: N/A   Occupational History  . retired truck Mining engineer term disability  Social History Main Topics  . Smoking status: Never Smoker   . Smokeless tobacco: Never Used  . Alcohol Use: 0.5 oz/week    1 drink(s) per week     Comment: occ  . Drug Use: No  . Sexually Active: Yes   Other Topics Concern  . Not on file   Social History Narrative  . No narrative on file    Family History  Problem Relation Age of Onset  . Colon cancer Father   . Lung cancer Father   . CAD Father   . Diabetes Father   . Heart attack Father   . Colon cancer Brother   . Heart attack Paternal Grandfather   . CAD Brother     Review of Systems:  As stated in the HPI and otherwise negative.   BP 158/108  Pulse 88  Ht 6' (1.829 m)  Wt 320 lb (145.151 kg)  BMI 43.40 kg/m2  SpO2 97%  Physical Examination: General: Well developed, well nourished, NAD HEENT: OP clear, mucus membranes moist SKIN: warm,  dry. No rashes. Neuro: No focal deficits Musculoskeletal: Muscle strength 5/5 all ext Psychiatric: Mood and affect normal Neck: No JVD, no carotid bruits, no thyromegaly, no lymphadenopathy. Lungs:Clear bilaterally, no wheezes, rhonci, crackles Cardiovascular: Regular rate and rhythm. No murmurs, gallops or rubs. Abdomen:Soft. Bowel sounds present. Non-tender.  Extremities: No lower extremity edema. Pulses are 2 + in the bilateral DP/PT.  Echo 12/26/11: Left ventricle: The cavity size was normal. Wall thickness was increased in a pattern of moderate LVH. Systolic function was normal. The estimated ejection fraction was in the range of 50% to 55%. Wall motion was normal; there were no regional wall motion abnormalities. Doppler parameters are consistent with abnormal left ventricular relaxation (grade 1 diastolic dysfunction). - Left atrium: The atrium was mildly dilated.  Lexiscan myoview 12/26/11: Stress Procedure: The patient received IV Lexiscan 0.4 mg over 15-seconds. Technetium 82m Sestamibi was injected at 30-seconds. There were no significant changes with Lexiscan. Patient was very hypertensive in recovery phase, 181/116. Rechecked after stress images he was down to 140/94. Quantitative spect images were obtained after a 45 minute delay.  Stress ECG: No significant ST segment change suggestive of ischemia.  QPS  Raw Data Images: Acquisition technically good; LVE.  Stress Images: There is decreased uptake in the inferior wall.  Rest Images: There is decreased uptake in the inferior wall.  Subtraction (SDS): No evidence of ischemia.  Transient Ischemic Dilatation (Normal <1.22): 1.00  Lung/Heart Ratio (Normal <0.45): 0.30  Quantitative Gated Spect Images  QGS EDV: 140 ml  QGS ESV: 62 ml  Impression  Exercise Capacity: Lexiscan with no exercise.  BP Response: Hypertensive blood pressure response.  Clinical Symptoms: There is dyspnea.  ECG Impression: No significant ST segment  change suggestive of ischemia.  Comparison with Prior Nuclear Study: No previous nuclear study performed  Overall Impression: Normal stress nuclear study with a small, mild, fixed inferior defect consistent with diaphragmatic attenuation; no ischemia.  LV Ejection Fraction: 56%. LV Wall Motion: NL LV Function; NL Wall Motion      Assessment and Plan:   1. Chest pain/Left shoulder pain: No evidence of ischemia on stress test.  Likely non-cardiac pain.   2. Hypertensive heart disease: He has moderate LVH on echo. BP elevated today. He cannot tolerate higher doses of Norvasc because of edema in both legs. It is not on his medication list here but he states that he has been taking Coreg 50 mg once  daily. Will have him take Coreg 25 mg po BID.  Continue Hyzaar. Follow up BP in primary care office at planned visit in two weeks. Follow up echo one year.

## 2012-01-19 NOTE — Patient Instructions (Addendum)
Your physician wants you to follow-up in:  12 months. You will receive a reminder letter in the mail two months in advance. If you don't receive a letter, please call our office to schedule the follow-up appointment.  Your physician has requested that you have an echocardiogram. Echocardiography is a painless test that uses sound waves to create images of your heart. It provides your doctor with information about the size and shape of your heart and how well your heart's chambers and valves are working. This procedure takes approximately one hour. There are no restrictions for this procedure. To be done in 12 months--week or so prior to appt with Dr. Clifton James   Your physician has recommended you make the following change in your medication:  Take Coreg (carvedilol) 25 mg by mouth twice daily

## 2012-02-05 ENCOUNTER — Ambulatory Visit (INDEPENDENT_AMBULATORY_CARE_PROVIDER_SITE_OTHER): Payer: Medicare PPO | Admitting: Internal Medicine

## 2012-02-05 VITALS — BP 145/90 | HR 76 | Temp 97.7°F | Wt 323.0 lb

## 2012-02-05 DIAGNOSIS — R739 Hyperglycemia, unspecified: Secondary | ICD-10-CM

## 2012-02-05 DIAGNOSIS — J45909 Unspecified asthma, uncomplicated: Secondary | ICD-10-CM

## 2012-02-05 DIAGNOSIS — G4733 Obstructive sleep apnea (adult) (pediatric): Secondary | ICD-10-CM

## 2012-02-05 DIAGNOSIS — Z23 Encounter for immunization: Secondary | ICD-10-CM

## 2012-02-05 DIAGNOSIS — E669 Obesity, unspecified: Secondary | ICD-10-CM

## 2012-02-05 DIAGNOSIS — E785 Hyperlipidemia, unspecified: Secondary | ICD-10-CM

## 2012-02-05 DIAGNOSIS — R7309 Other abnormal glucose: Secondary | ICD-10-CM

## 2012-02-05 DIAGNOSIS — I10 Essential (primary) hypertension: Secondary | ICD-10-CM

## 2012-02-05 LAB — BASIC METABOLIC PANEL
BUN: 20 mg/dL (ref 6–23)
Chloride: 99 mEq/L (ref 96–112)
GFR: 89.47 mL/min (ref >60.00)
Glucose, Bld: 118 mg/dL — ABNORMAL HIGH (ref 70–99)
Potassium: 3.9 mEq/L (ref 3.5–5.1)
Sodium: 139 mEq/L (ref 135–145)

## 2012-02-05 LAB — LIPID PANEL
Cholesterol: 208 mg/dL — ABNORMAL HIGH (ref 0–200)
Total CHOL/HDL Ratio: 5
VLDL: 79.6 mg/dL — ABNORMAL HIGH (ref 0.0–40.0)

## 2012-02-05 LAB — HEPATIC FUNCTION PANEL
ALT: 31 U/L (ref 0–53)
AST: 24 U/L (ref 0–37)
Alkaline Phosphatase: 45 U/L (ref 39–117)
Total Bilirubin: 0.7 mg/dL (ref 0.3–1.2)

## 2012-02-05 LAB — HEMOGLOBIN A1C: Hgb A1c MFr Bld: 6.2 % (ref 4.6–6.5)

## 2012-02-05 LAB — LDL CHOLESTEROL, DIRECT: Direct LDL: 100.2 mg/dL

## 2012-02-05 NOTE — Progress Notes (Signed)
Patient ID: Tommy Jackson, male   DOB: October 27, 1955, 56 y.o.   MRN: [id number] Obese-- has not started to lose weight-- he is unsure whether he is motivated to do it  Lipids-- not on any meds  htn-- not monitored at home but he does have cuff  Cough-- he has known reactive airway disease-- wheezes occasionally- uses inahler prn (albuterol) But uses dulera bid  Past Medical History  Diagnosis Date  . HYPERTENSION 11/29/2005  . HYPERLIPIDEMIA 02/07/2007  . OBESITY 10/01/2007  . SLEEP APNEA, OBSTRUCTIVE 10/01/2007  . ASTHMA 09/07/2009  . Herniated disc     History   Social History  . Marital Status: Single    Spouse Name: N/A    Number of Children: 3  . Years of Education: N/A   Occupational History  . retired truck Mining engineer term disability    Social History Main Topics  . Smoking status: Never Smoker   . Smokeless tobacco: Never Used  . Alcohol Use: 0.5 oz/week    1 drink(s) per week     Comment: occ  . Drug Use: No  . Sexually Active: Yes   Other Topics Concern  . Not on file   Social History Narrative  . No narrative on file    Past Surgical History  Procedure Date  . Shoulder surgery     right  . Elbow surgery     right    Family History  Problem Relation Age of Onset  . Colon cancer Father   . Lung cancer Father   . CAD Father   . Diabetes Father   . Heart attack Father   . Colon cancer Brother   . Heart attack Paternal Grandfather   . CAD Brother     Allergies  Allergen Reactions  . Aspirin     REACTION: unspecified  . Penicillins     REACTION: family hx    Current Outpatient Prescriptions on File Prior to Visit  Medication Sig Dispense Refill  . albuterol (VENTOLIN HFA) 108 (90 BASE) MCG/ACT inhaler Inhale 2 puffs into the lungs every 4 (four) hours as needed.        Marland Kitchen amLODipine (NORVASC) 10 MG tablet Take 0.5 tablets (5 mg total) by mouth daily.  90 tablet  3  . carvedilol (COREG) 25 MG tablet Take 1 tablet (25 mg total) by mouth 2 (two)  times daily with a meal.  60 tablet  11  . chlorpheniramine-HYDROcodone (TUSSIONEX PENNKINETIC ER) 10-8 MG/5ML LQCR Take 5 mLs by mouth every 12 (twelve) hours as needed.  140 mL  0  . HYDROcodone-acetaminophen (VICODIN ES) 7.5-750 MG per tablet Take 1 tablet by mouth every 6 (six) hours as needed. Patient used this medication for pain in his arm.      . losartan-hydrochlorothiazide (HYZAAR) 100-12.5 MG per tablet Take 1 tablet by mouth daily.  90 tablet  3  . meloxicam (MOBIC) 15 MG tablet Take 1 tablet by mouth daily.      . mometasone (NASONEX) 50 MCG/ACT nasal spray Place 2 sprays into the nose daily as needed.  17 g  1  . Mometasone Furo-Formoterol Fum (DULERA) 100-5 MCG/ACT AERO Inhale 2 puffs into the lungs 2 (two) times daily.  1 Inhaler  6  . Multiple Vitamin (MULTIVITAMIN) capsule Take 1 capsule by mouth daily.           patient denies chest pain, shortness of breath, orthopnea. Denies lower extremity edema, abdominal pain, change in appetite, change in bowel movements.  Patient denies rashes, musculoskeletal complaints. No other specific complaints in a complete review of systems.   BP 152/98  Pulse 76  Temp 97.7 F (36.5 C) (Oral)  Wt 323 lb (146.512 kg)  well-developed well-nourished male in no acute distress. HEENT exam atraumatic, normocephalic, neck supple without jugular venous distention. Chest clear to auscultation cardiac exam S1-S2 are regular. Abdominal exam overweight with bowel sounds, soft and nontender. Extremities no edema. Neurologic exam is alert with a normal gait.

## 2012-02-05 NOTE — Assessment & Plan Note (Signed)
Not controlled He is committed to weight loss He needs to lose 100 pounds or more Continue same meds Start walking daily

## 2012-02-05 NOTE — Assessment & Plan Note (Signed)
Continue dulera- Albuterol prn

## 2012-02-05 NOTE — Assessment & Plan Note (Signed)
Absolutely related to weight loss

## 2012-02-05 NOTE — Assessment & Plan Note (Signed)
This is his most significant problem. Discussed weight loss surgeries--

## 2012-02-14 ENCOUNTER — Other Ambulatory Visit: Payer: Self-pay | Admitting: Internal Medicine

## 2012-02-14 DIAGNOSIS — E785 Hyperlipidemia, unspecified: Secondary | ICD-10-CM

## 2012-02-14 DIAGNOSIS — R739 Hyperglycemia, unspecified: Secondary | ICD-10-CM

## 2012-02-22 ENCOUNTER — Other Ambulatory Visit: Payer: Self-pay | Admitting: Internal Medicine

## 2012-02-22 ENCOUNTER — Ambulatory Visit
Admission: RE | Admit: 2012-02-22 | Discharge: 2012-02-22 | Disposition: A | Discharge status: Home / Self Care | Payer: Medicare PPO | Source: Ambulatory Visit | Attending: Internal Medicine | Admitting: Internal Medicine

## 2012-02-22 ENCOUNTER — Other Ambulatory Visit: Payer: Medicare PPO

## 2012-02-22 ENCOUNTER — Encounter: Payer: Self-pay | Admitting: Internal Medicine

## 2012-02-22 ENCOUNTER — Ambulatory Visit (INDEPENDENT_AMBULATORY_CARE_PROVIDER_SITE_OTHER): Payer: Medicare PPO | Admitting: Internal Medicine

## 2012-02-22 VITALS — BP 130/80 | HR 80 | Temp 98.0°F | Resp 20 | Wt 321.0 lb

## 2012-02-22 DIAGNOSIS — IMO0002 Reserved for concepts with insufficient information to code with codable children: Secondary | ICD-10-CM

## 2012-02-22 DIAGNOSIS — M5412 Radiculopathy, cervical region: Secondary | ICD-10-CM | POA: Insufficient documentation

## 2012-02-22 DIAGNOSIS — I10 Essential (primary) hypertension: Secondary | ICD-10-CM

## 2012-02-22 DIAGNOSIS — E669 Obesity, unspecified: Secondary | ICD-10-CM

## 2012-02-22 MED ORDER — DIAZEPAM 10 MG PO TABS: 10.0000 mg | ORAL_TABLET | Freq: Two times a day (BID) | ORAL | Status: DC | PRN

## 2012-02-22 NOTE — Telephone Encounter (Addendum)
Pt states he was able to schedule MRI today at 1pm (be there 12:30)  and would like to have a valium for the MRI. Pt had panick attack last time. Pt wants come by and pick that up ASAP. Is that OK? Pt cell phone is [phone] Pt saw Dr Kirtland Bouchard today.

## 2012-02-22 NOTE — Telephone Encounter (Signed)
Pt already came by for Rx for Valium

## 2012-02-22 NOTE — Progress Notes (Signed)
Subjective:    Patient ID: Tommy Jackson, male    DOB: 06-16-1955, 56 y.o.   MRN: [id number]  HPI  56 year old patient who has a history of cervical and lumbar disc disease. He is followed by a [place] neurologist do to work related lumbar disc disease. He presents with a five-day history of pain in the neck and left shoulder area. He has noted weakness involving the left arm. Denies any paresthesias. He has a history of obesity as well as treated hypertension and OSA.  Past Medical History  Diagnosis Date  . HYPERTENSION 11/29/2005  . HYPERLIPIDEMIA 02/07/2007  . OBESITY 10/01/2007  . SLEEP APNEA, OBSTRUCTIVE 10/01/2007  . ASTHMA 09/07/2009  . Herniated disc     History   Social History  . Marital Status: Single    Spouse Name: N/A    Number of Children: 3  . Years of Education: N/A   Occupational History  . retired truck Mining engineer term disability    Social History Main Topics  . Smoking status: Never Smoker   . Smokeless tobacco: Never Used  . Alcohol Use: 0.5 oz/week    1 drink(s) per week     Comment: occ  . Drug Use: No  . Sexually Active: Yes   Other Topics Concern  . Not on file   Social History Narrative  . No narrative on file    Past Surgical History  Procedure Date  . Shoulder surgery     right  . Elbow surgery     right    Family History  Problem Relation Age of Onset  . Colon cancer Father   . Lung cancer Father   . CAD Father   . Diabetes Father   . Heart attack Father   . Colon cancer Brother   . Heart attack Paternal Grandfather   . CAD Brother     Allergies  Allergen Reactions  . Aspirin     REACTION: unspecified  . Penicillins     REACTION: family hx    Current Outpatient Prescriptions on File Prior to Visit  Medication Sig Dispense Refill  . albuterol (VENTOLIN HFA) 108 (90 BASE) MCG/ACT inhaler Inhale 2 puffs into the lungs every 4 (four) hours as needed.        Marland Kitchen amLODipine (NORVASC) 10 MG tablet Take 0.5 tablets (5 mg  total) by mouth daily.  90 tablet  3  . carvedilol (COREG) 25 MG tablet Take 1 tablet (25 mg total) by mouth 2 (two) times daily with a meal.  60 tablet  11  . HYDROcodone-acetaminophen (VICODIN ES) 7.5-750 MG per tablet Take 1 tablet by mouth every 6 (six) hours as needed. Patient used this medication for pain in his arm.      . losartan-hydrochlorothiazide (HYZAAR) 100-12.5 MG per tablet Take 1 tablet by mouth daily.  90 tablet  3  . meloxicam (MOBIC) 15 MG tablet Take 1 tablet by mouth daily.      . mometasone (NASONEX) 50 MCG/ACT nasal spray Place 2 sprays into the nose daily as needed.  17 g  1  . Mometasone Furo-Formoterol Fum (DULERA) 100-5 MCG/ACT AERO Inhale 2 puffs into the lungs 2 (two) times daily.  1 Inhaler  6  . Multiple Vitamin (MULTIVITAMIN) capsule Take 1 capsule by mouth daily.          BP 130/80  Pulse 80  Temp 98 F (36.7 C) (Oral)  Resp 20  Wt 321 lb (145.605 kg)  SpO2 93%  Review of Systems  Constitutional: Negative for fever, chills, appetite change and fatigue.  HENT: Negative for hearing loss, ear pain, congestion, sore throat, trouble swallowing, neck stiffness, dental problem, voice change and tinnitus.   Eyes: Negative for pain, discharge and visual disturbance.  Respiratory: Negative for cough, chest tightness, wheezing and stridor.   Cardiovascular: Negative for chest pain, palpitations and leg swelling.  Gastrointestinal: Negative for nausea, vomiting, abdominal pain, diarrhea, constipation, blood in stool and abdominal distention.  Genitourinary: Negative for urgency, hematuria, flank pain, discharge, difficulty urinating and genital sores.  Musculoskeletal: Positive for arthralgias. Negative for myalgias, back pain, joint swelling and gait problem.       Neck and left shoulder aching  Skin: Negative for rash.  Neurological: Positive for weakness. Negative for dizziness, syncope, speech difficulty, numbness and headaches.  Hematological:  Negative for adenopathy. Does not bruise/bleed easily.  Psychiatric/Behavioral: Negative for behavioral problems and dysphoric mood. The patient is not nervous/anxious.        Objective:   Physical Exam  Constitutional: He appears well-developed and well-nourished. No distress.       Blood pressure 130/80  Musculoskeletal:       Patient able to abduct left arm to 90 only  Passive range of motion of the left shoulder appears intact  Neurological:       Biceps reflexes were blunted but appear to be symmetrical. Triceps reflexes were normal Grip strength appeared be intact The patient was unable to flex left lower arm with the arm extended horizontally; Extension of  the arm appear to be intact          Assessment & Plan:   Cervical radiculopathy with left arm weakness. We'll proceed with cervical MRI and neurosurgical evaluation Hypertension OSA

## 2012-02-22 NOTE — Patient Instructions (Signed)
Cervical MRI and neurosurgical referral as discussed Report immediately any change in your status

## 2012-02-22 NOTE — Telephone Encounter (Signed)
Patient was in the office today and is scheduled for an MRi but the MD did not call him in a valium so that he can go forward with the MRI. Please assist and send to [facility] [address]

## 2012-02-23 ENCOUNTER — Telehealth: Payer: Self-pay | Admitting: Internal Medicine

## 2012-02-23 NOTE — Telephone Encounter (Signed)
Called pt in regard to her neuro surgery  Referral. Pt is set up but is curious about the results of his MRI please contact pt

## 2012-02-23 NOTE — Telephone Encounter (Signed)
Spoke to pt read results of MRI and pt is scheduled to see Neurosurgeon on Monday.

## 2012-02-25 ENCOUNTER — Other Ambulatory Visit: Payer: Medicare PPO

## 2012-02-28 HISTORY — PX: OTHER SURGICAL HISTORY: SHX169

## 2012-03-21 ENCOUNTER — Encounter: Payer: Self-pay | Admitting: *Deleted

## 2012-03-21 ENCOUNTER — Encounter: Payer: Medicare PPO | Attending: Internal Medicine | Admitting: *Deleted

## 2012-03-21 VITALS — Ht 72.0 in | Wt 318.6 lb

## 2012-03-21 DIAGNOSIS — Z713 Dietary counseling and surveillance: Secondary | ICD-10-CM | POA: Insufficient documentation

## 2012-03-21 DIAGNOSIS — E785 Hyperlipidemia, unspecified: Secondary | ICD-10-CM | POA: Insufficient documentation

## 2012-03-21 DIAGNOSIS — E669 Obesity, unspecified: Secondary | ICD-10-CM

## 2012-03-21 NOTE — Patient Instructions (Signed)
Plan:  Aim for 4 Carb Choices per meal (60 grams) +/- 1 either way  Aim for 0-2 Carbs per snack if hungry  Include protein with each meal and snack as desired Consider reading food labels for Total Carbohydrate of foods Consider  increasing your activity level by walking and possible gym / pool  for 5-10 minutes daily as tolerated

## 2012-03-21 NOTE — Progress Notes (Signed)
  Medical Nutrition Therapy:  Appt start time: 0915 end time:  1015.  Assessment:  Primary concerns today: patient here for hyperlipidemia and hyperglycemia. Has lost weight down to 240 in past when exercised by walking at least 3 miles a day quickly outside with hills etc in all types of weather up to 10 miles at a time. Ate more fruits and vegetables, less high fat foods. Newly married 3 months ago, he is disabled due to back problem. They are busy with family members who need care, they do not sit around doing nothing. Both shop for food, wife cooks most meals. Eat out often with errands for family. On computer a lot with games, [employer], etc  MEDICATIONS: see list   DIETARY INTAKE:  Usual eating pattern includes 3 meals and 1-2 snacks per day.  Everyday foods include good variety of all food groups.  Avoided foods include grits, coffee most sodas, extra fat or salt on foods, .    24-hr recall:  B ( AM): skips 1-2 days a week or eats late in AM, eggs, bacon or sausage, occasional slice of toast or fried potatoes, OR unsweetened cereal with 2% milk,  water Snk ( AM): none  L ( PM): restaurant sandwich or salad type meal with no dressing OR hot meal, no fries, usually a vegetable with water or unsweetened tea  Snk ( PM): not usually, occasionally fresh fruit or handful of chips or 3 small pieces of candy D ( PM): eat out at least 50% of the week. Meat, starch, vegetable meal or pasta OR pizza OR Stromboli etc Snk ( PM): small piece of dessert or peanuts OR ice cream large portion or several bars Beverages: water or unsweetened tea or diet Pepsi occasionally  Usual physical activity: none right now due to current retirement life filling up his time. Some back pain as well  Estimated energy needs: 1600 calories 180 g carbohydrates 120 g protein 44 g fat  Progress Towards Goal(s):  In progress.   Nutritional Diagnosis:  NI-1.5 Excessive energy intake As related to activity level.  As  evidenced by BMI of 43.3.    Intervention:  Nutrition counseling and explanation of pre-diabetes initiated. Discussed basic physiology of pre-diabetes, A1c, Carb Counting and reading food labels, and benefits of increased activity. Emphasized portion control thru carb counting and eating at specific place of home vs in from of tv or computer. Also discussed initiating an activity plan to start increasing his metabolism  Plan:  Aim for 4 Carb Choices per meal (60 grams) +/- 1 either way  Aim for 0-2 Carbs per snack if hungry  Include protein with each meal and snack as desired Consider reading food labels for Total Carbohydrate of foods Consider  increasing your activity level by walking and possible gym / pool  for 5-10 minutes daily as tolerated  Handouts given during visit include: A1c handout Carb Counting and Food Label handouts Meal Plan Card  Monitoring/Evaluation:  Dietary intake, exercise, reading food labels, and body weight in 6 week(s).

## 2012-04-30 ENCOUNTER — Ambulatory Visit: Payer: Medicare PPO | Admitting: *Deleted

## 2012-05-06 ENCOUNTER — Emergency Department (HOSPITAL_BASED_OUTPATIENT_CLINIC_OR_DEPARTMENT_OTHER): Payer: Medicare PPO

## 2012-05-06 ENCOUNTER — Encounter (HOSPITAL_BASED_OUTPATIENT_CLINIC_OR_DEPARTMENT_OTHER): Payer: Self-pay

## 2012-05-06 ENCOUNTER — Other Ambulatory Visit: Payer: Self-pay

## 2012-05-06 ENCOUNTER — Emergency Department (HOSPITAL_BASED_OUTPATIENT_CLINIC_OR_DEPARTMENT_OTHER)
Admission: EM | Admit: 2012-05-06 | Discharge: 2012-05-07 | Disposition: A | Discharge status: Home / Self Care | Payer: Medicare PPO | Attending: Emergency Medicine | Admitting: Emergency Medicine

## 2012-05-06 DIAGNOSIS — J45909 Unspecified asthma, uncomplicated: Secondary | ICD-10-CM | POA: Insufficient documentation

## 2012-05-06 DIAGNOSIS — Z8739 Personal history of other diseases of the musculoskeletal system and connective tissue: Secondary | ICD-10-CM | POA: Insufficient documentation

## 2012-05-06 DIAGNOSIS — Z8639 Personal history of other endocrine, nutritional and metabolic disease: Secondary | ICD-10-CM | POA: Insufficient documentation

## 2012-05-06 DIAGNOSIS — R5383 Other fatigue: Secondary | ICD-10-CM

## 2012-05-06 DIAGNOSIS — G4733 Obstructive sleep apnea (adult) (pediatric): Secondary | ICD-10-CM | POA: Insufficient documentation

## 2012-05-06 DIAGNOSIS — R5381 Other malaise: Secondary | ICD-10-CM | POA: Insufficient documentation

## 2012-05-06 DIAGNOSIS — Z79899 Other long term (current) drug therapy: Secondary | ICD-10-CM | POA: Insufficient documentation

## 2012-05-06 DIAGNOSIS — Z862 Personal history of diseases of the blood and blood-forming organs and certain disorders involving the immune mechanism: Secondary | ICD-10-CM | POA: Insufficient documentation

## 2012-05-06 DIAGNOSIS — E669 Obesity, unspecified: Secondary | ICD-10-CM | POA: Insufficient documentation

## 2012-05-06 LAB — CBC WITH DIFFERENTIAL/PLATELET
Basophils Absolute: 0 10*3/uL (ref 0.0–0.1)
Basophils Relative: 0 % (ref 0–1)
Eosinophils Absolute: 0.3 10*3/uL (ref 0.0–0.7)
Eosinophils Relative: 3 % (ref 0–5)
HCT: 42.6 % (ref 39.0–52.0)
Hemoglobin: 15.2 g/dL (ref 13.0–17.0)
MCH: 31.9 pg (ref 26.0–34.0)
MCHC: 35.7 g/dL (ref 30.0–36.0)
MCV: 89.3 fL (ref 78.0–100.0)
Monocytes Absolute: 0.8 10*3/uL (ref 0.1–1.0)
Monocytes Relative: 9 % (ref 3–12)
RDW: 12.2 % (ref 11.5–15.5)

## 2012-05-06 LAB — TROPONIN I: Troponin I: <0.3 ng/mL (ref <0.30)

## 2012-05-06 LAB — URINALYSIS, ROUTINE W REFLEX MICROSCOPIC
Bilirubin Urine: NEGATIVE
Glucose, UA: NEGATIVE mg/dL
Hgb urine dipstick: NEGATIVE
Protein, ur: NEGATIVE mg/dL

## 2012-05-06 NOTE — ED Notes (Signed)
C/o SOB at night-"just dont' feel right"-cold/clammy/fatigue-elevated BP at home

## 2012-05-06 NOTE — ED Provider Notes (Signed)
History  This chart was scribed for Hurman Horn, MD by Ardeen Jourdain, ED Scribe. This patient was seen in room MH06/MH06 and the patient's care was started at 2254.  CSN: [id number]  Arrival date & time 05/06/12  2045   First MD Initiated Contact with Patient 05/06/12 2254      Chief Complaint  Patient presents with  . Hypertension     The history is provided by the patient. No language interpreter was used.    Tommy Jackson is a 57 y.o. male with a h/o HTN, sleep apnea, chronic neck pain, chronic back pain and asthma who presents to the Emergency Department complaining of SOB that began last night and lasted a few hours. He states he has had over 10 hours of fatigue that was preceeded by the SOB. He states he has a lack of appetite and "just doesn't feel right." He describes the initial SOB as feeling like "he wasn't getting enough air" after going to bed with his C-PAP. He states he had an elevated BP at home. He denies any fever, confusion, nausea, emesis, melena, diarrhea, CP, abdominal pain, and numbness as associated symptoms. He denies any changes in speech, vision and swallow. He states he uses a C-PAP with air. He states he had a negative stress test within the last 6 months.   Past Medical History  Diagnosis Date  . HYPERTENSION 11/29/2005  . HYPERLIPIDEMIA 02/07/2007  . OBESITY 10/01/2007  . SLEEP APNEA, OBSTRUCTIVE 10/01/2007  . ASTHMA 09/07/2009  . Herniated disc     Past Surgical History  Procedure Laterality Date  . Shoulder surgery      right  . Elbow surgery      right    Family History  Problem Relation Age of Onset  . Colon cancer Father   . Lung cancer Father   . CAD Father   . Diabetes Father   . Heart attack Father   . Colon cancer Brother   . Heart attack Paternal Grandfather   . CAD Brother     History  Substance Use Topics  . Smoking status: Never Smoker   . Smokeless tobacco: Never Used  . Alcohol Use: 0.0 oz/week      Review of  Systems  10 Systems reviewed and are negative for acute change except as noted in the HPI.  Allergies  Aspirin; Glycerin; and Penicillins  Home Medications   Current Outpatient Rx  Name  Route  Sig  Dispense  Refill  . albuterol (VENTOLIN HFA) 108 (90 BASE) MCG/ACT inhaler   Inhalation   Inhale 2 puffs into the lungs every 4 (four) hours as needed.           . carvedilol (COREG CR) 40 MG 24 hr capsule   Oral   Take 1 capsule (40 mg total) by mouth daily.   90 capsule   1   . diazepam (VALIUM) 10 MG tablet   Oral   Take 1 tablet (10 mg total) by mouth every 12 (twelve) hours as needed for anxiety.   10 tablet   1   . HYDROcodone-acetaminophen (VICODIN ES) 7.5-750 MG per tablet   Oral   Take 1 tablet by mouth every 6 (six) hours as needed. Patient used this medication for pain in his arm.         . meloxicam (MOBIC) 15 MG tablet   Oral   Take 1 tablet by mouth daily.         Marland Kitchen  mometasone (NASONEX) 50 MCG/ACT nasal spray   Nasal   Place 2 sprays into the nose daily as needed.   17 g   1   . Mometasone Furo-Formoterol Fum (DULERA) 100-5 MCG/ACT AERO   Inhalation   Inhale 2 puffs into the lungs 2 (two) times daily.   1 Inhaler   6   . Multiple Vitamin (MULTIVITAMIN) capsule   Oral   Take 1 capsule by mouth daily.           Marland Kitchen nystatin (MYCOSTATIN) 100000 UNIT/ML suspension   Oral   Take 5 mLs (500,000 Units total) by mouth 4 (four) times daily.   60 mL   1   . Olmesartan-Amlodipine-HCTZ 20-5-12.5 MG TABS   Oral   Take 1 tablet by mouth daily.   90 tablet   1   . triamcinolone cream (KENALOG) 0.1 %   Topical   Apply topically 2 (two) times daily.   30 g   1     Triage Vitals: BP 156/94  Pulse 70  Temp(Src) 98.2 F (36.8 C) (Oral)  Resp 22  Ht 6' (1.829 m)  Wt 313 lb (141.976 kg)  BMI 42.44 kg/m2  SpO2 94%  Physical Exam  Nursing note and vitals reviewed. Constitutional: He appears well-developed and well-nourished. No distress.   Awake, alert, nontoxic appearance with baseline speech for patient. Morbidly obese   HENT:  Head: Normocephalic and atraumatic.  Mouth/Throat: No oropharyngeal exudate.  Eyes: EOM are normal. Pupils are equal, round, and reactive to light. Right eye exhibits no discharge. Left eye exhibits no discharge.  Neck: Neck supple.  Cardiovascular: Normal rate, regular rhythm and normal heart sounds.  Exam reveals no gallop and no friction rub.   No murmur heard. Pulmonary/Chest: Effort normal and breath sounds normal. No stridor. No respiratory distress. He has no wheezes. He has no rales. He exhibits no tenderness.  Abdominal: Soft. Bowel sounds are normal. He exhibits no distension and no mass. There is no tenderness. There is no rebound and no guarding.  Musculoskeletal: He exhibits no tenderness.  Baseline ROM, moves extremities with no obvious new focal weakness.  Lymphadenopathy:    He has no cervical adenopathy.  Neurological: No cranial nerve deficit.  Awake, alert, cooperative and aware of situation; motor strength bilaterally; sensation normal to light touch bilaterally; peripheral visual fields full to confrontation; no facial asymmetry; tongue midline; major cranial nerves appear intact; no pronator drift, normal finger to nose bilaterally, baseline gait without new ataxia.  Skin: Skin is warm and dry. No rash noted. He is not diaphoretic.  Psychiatric: He has a normal mood and affect. His behavior is normal.    ED Course  Procedures (including critical care time) ECG: Normal sinus rhythm, ventricular rate 74, normal axis, normal intervals, no acute ischemic changes noted, no significant change noted compared with October 2013 DIAGNOSTIC STUDIES: Oxygen Saturation is 94% on room air, adequate by my interpretation.    COORDINATION OF CARE:  11:06 PM: Patient / Family / Caregiver understand and agree with initial ED impression and plan with expectations set for ED visit.     Labs  Reviewed  BASIC METABOLIC PANEL - Abnormal; Notable for the following:    Potassium 3.3 (*)    Glucose, Bld 110 (*)    All other components within normal limits  CBC WITH DIFFERENTIAL  TROPONIN I  LACTIC ACID, PLASMA  URINALYSIS, ROUTINE W REFLEX MICROSCOPIC   Dg Chest 2 View  05/07/2012  *RADIOLOGY REPORT*  Clinical Data: Hypertension  CHEST - 2 VIEW  Comparison: 11/30/2011  Findings: Heart size within normal range.  Mild aortic tortuosity. No confluent airspace opacity.  No pleural effusion or pneumothorax.  No acute osseous finding.  IMPRESSION: No radiographic evidence of acute cardiopulmonary process.   Original Report Authenticated By: Jearld Lesch, M.D.      1. Fatigue       MDM  I doubt any other [facility] precluding discharge at this time including, but not necessarily limited to the following:non-low risk ACS.  I personally performed the services described in this documentation, which was scribed in my presence. The recorded information has been reviewed and is accurate.    Hurman Horn, MD 05/08/12 2259

## 2012-05-07 ENCOUNTER — Telehealth: Payer: Self-pay | Admitting: Internal Medicine

## 2012-05-07 ENCOUNTER — Encounter: Payer: Self-pay | Admitting: Internal Medicine

## 2012-05-07 ENCOUNTER — Ambulatory Visit (INDEPENDENT_AMBULATORY_CARE_PROVIDER_SITE_OTHER): Payer: Medicare PPO | Admitting: Internal Medicine

## 2012-05-07 VITALS — BP 174/114 | HR 88 | Temp 98.1°F | Wt 308.0 lb

## 2012-05-07 DIAGNOSIS — L259 Unspecified contact dermatitis, unspecified cause: Secondary | ICD-10-CM

## 2012-05-07 DIAGNOSIS — L309 Dermatitis, unspecified: Secondary | ICD-10-CM | POA: Insufficient documentation

## 2012-05-07 DIAGNOSIS — I10 Essential (primary) hypertension: Secondary | ICD-10-CM

## 2012-05-07 DIAGNOSIS — J45909 Unspecified asthma, uncomplicated: Secondary | ICD-10-CM

## 2012-05-07 LAB — BASIC METABOLIC PANEL
BUN: 19 mg/dL (ref 6–23)
Calcium: 9.7 mg/dL (ref 8.4–10.5)
Chloride: 100 mEq/L (ref 96–112)
Creatinine, Ser: 0.7 mg/dL (ref 0.50–1.35)
GFR calc Af Amer: >90 mL/min (ref >90)
GFR calc non Af Amer: >90 mL/min (ref >90)

## 2012-05-07 MED ORDER — TRIAMCINOLONE ACETONIDE 0.1 % EX CREA: TOPICAL_CREAM | Freq: Two times a day (BID) | CUTANEOUS | Status: DC

## 2012-05-07 MED ORDER — OLMESARTAN-AMLODIPINE-HCTZ 20-5-12.5 MG PO TABS: 1.0000 | ORAL_TABLET | Freq: Every day | ORAL | Status: DC

## 2012-05-07 MED ORDER — CARVEDILOL PHOSPHATE ER 40 MG PO CP24: 40.0000 mg | ORAL_CAPSULE | Freq: Every day | ORAL | Status: DC

## 2012-05-07 MED ORDER — NYSTATIN 100000 UNIT/ML MT SUSP: 500000.0000 [IU] | Freq: Four times a day (QID) | OROMUCOSAL | Status: DC

## 2012-05-07 NOTE — Assessment & Plan Note (Signed)
Stable.  Patient has mild thrush from [facility]. Patient advised to rinse mouth after inhaler use. Use nystatin suspension as directed.

## 2012-05-07 NOTE — Telephone Encounter (Signed)
Patient Information:  Caller Name: Tommy Jackson  Phone: [phone]  Patient: Tommy Jackson, Tommy Jackson  Gender: Male  DOB: [birth year]  Age: 57 Years  PCP: [name] (Adults only)  Office Follow Up:  Does the office need to follow up with this patient?: No  Instructions For The Office: N/A  RN Note:  Wife would like her husband seen as soon as possible. Dr. Artist Pais had Injection appointment open @ 4:15 pm - called office and spoke with Arline Asp (Dr. Olegario Messier nurse) - 4 pm canceled so she has appointment scheduled for then, but knows that they won't be there until 4:15 pm.  Symptoms  Reason For Call & Symptoms: Patient's Blood Pressure was up  yesterday to 195/115 (the highest reading) and his wife took him to the ER (ER note is in EPIC - 05/06/12). Wife states that he is taking Amlodipine (NORVASC) 5 mgs (Changed from 10 mg QD to 5 mg QD on 08/23/2011),  Losartan-HCTZ (Hyzaar) 100 - 12.5 mg QD & Carvedilol (Coreg) 25 mg BID. At the ER EKG, Bloodwork, Urine Test, & Chest X-Ray with all the results normal. Today his BP was 162/113 @ 1 pm after taking his meds at 10 am. Patient states that he has no energy, is cold, clammy and sweaty.  Reviewed Health History In EMR: Yes  Reviewed Medications In EMR: Yes  Reviewed Allergies In EMR: Yes  Reviewed Surgeries / Procedures: Yes  Date of Onset of Symptoms: 05/06/2012  Guideline(s) Used:  High Blood Pressure  Disposition Per Guideline:   See Within 2 Weeks in Office  Reason For Disposition Reached:   BP > 160/100  Advice Given:  N/A  Appointment Scheduled:  05/07/2012 16:00:00 Appointment Scheduled Provider:  Artist Pais, Doe-Hyun Molly Maduro) (Adults only)

## 2012-05-07 NOTE — Assessment & Plan Note (Addendum)
Patient has mild maculopapular rash on distal aspect of forearms. I suspect allergic dermatitis. Trial of triamcinolone cream.

## 2012-05-07 NOTE — Assessment & Plan Note (Addendum)
Blood pressure is poorly controlled. He often does remember to take second dose of carvedilol. Simply blood pressure medication regimen. Discontinue Hyzaar, amlodipine and carvedilol. Switch to Tribenzor and Coreg CR 40 mg.  Poor blood pressure control may also be exacerbated by under treated OSA. Patient advised to followup with sleep specialist for CPAP titration.  Reassess in 2 weeks.

## 2012-05-07 NOTE — Progress Notes (Signed)
Subjective:    Patient ID: Tommy Jackson, male    DOB: 09/11/1955, 57 y.o.   MRN: [id number]  HPI  57 year old white male with morbid obesity, obstructive sleep apnea, asthma and hypertension for emergency room followup. Patient seen yesterday secondary to severely elevated blood pressure readings. Patient also complains of unusual fatigue. Prior to ER visit patient reports difficulty with CPAP machine. He felt like he was not getting enough air/oxygen. He  also reports he was unable to use CPAP machine for 3 days due to power outage.  He has been having issues with face mask. His sleep specialist recommended using higher pressures but he could not tolerate.  ER evaluation included blood work and chest x-ray. His cardiac enzyme was normal. His CBC was unremarkable. His chest x-ray was negative for acute process.  He takes 3 blood pressure medications currently (amlodipine, Hyzaar, and carvedilol). He does not always take second dose of carvedilol.   Review of Systems Negative for chest pain, fatigue, negative for fever or chills  Past Medical History  Diagnosis Date  . HYPERTENSION 11/29/2005  . HYPERLIPIDEMIA 02/07/2007  . OBESITY 10/01/2007  . SLEEP APNEA, OBSTRUCTIVE 10/01/2007  . ASTHMA 09/07/2009  . Herniated disc     History   Social History  . Marital Status: Single    Spouse Name: N/A    Number of Children: 3  . Years of Education: N/A   Occupational History  . retired truck Mining engineer term disability    Social History Main Topics  . Smoking status: Never Smoker   . Smokeless tobacco: Never Used  . Alcohol Use: 0.0 oz/week  . Drug Use: No  . Sexually Active: Not on file   Other Topics Concern  . Not on file   Social History Narrative  . No narrative on file    Past Surgical History  Procedure Laterality Date  . Shoulder surgery      right  . Elbow surgery      right    Family History  Problem Relation Age of Onset  . Colon cancer Father   . Lung  cancer Father   . CAD Father   . Diabetes Father   . Heart attack Father   . Colon cancer Brother   . Heart attack Paternal Grandfather   . CAD Brother     Allergies  Allergen Reactions  . Aspirin     REACTION: unspecified  . Glycerin Nausea Only  . Penicillins     REACTION: family hx    Current Outpatient Prescriptions on File Prior to Visit  Medication Sig Dispense Refill  . albuterol (VENTOLIN HFA) 108 (90 BASE) MCG/ACT inhaler Inhale 2 puffs into the lungs every 4 (four) hours as needed.        . diazepam (VALIUM) 10 MG tablet Take 1 tablet (10 mg total) by mouth every 12 (twelve) hours as needed for anxiety.  10 tablet  1  . HYDROcodone-acetaminophen (VICODIN ES) 7.5-750 MG per tablet Take 1 tablet by mouth every 6 (six) hours as needed. Patient used this medication for pain in his arm.      . meloxicam (MOBIC) 15 MG tablet Take 1 tablet by mouth daily.      . mometasone (NASONEX) 50 MCG/ACT nasal spray Place 2 sprays into the nose daily as needed.  17 g  1  . Mometasone Furo-Formoterol Fum (DULERA) 100-5 MCG/ACT AERO Inhale 2 puffs into the lungs 2 (two) times daily.  1 Inhaler  6  .  Multiple Vitamin (MULTIVITAMIN) capsule Take 1 capsule by mouth daily.         No current facility-administered medications on file prior to visit.    BP 174/114  Pulse 88  Temp(Src) 98.1 F (36.7 C) (Oral)  Wt 308 lb (139.708 kg)  BMI 41.76 kg/m2       Objective:   Physical Exam  Constitutional: He is oriented to person, place, and time. He appears well-developed. No distress.  HENT:  Head: Normocephalic and atraumatic.  White film on tongue  Eyes: EOM are normal. Pupils are equal, round, and reactive to light.  Neck: Neck supple.  Cardiovascular: Normal rate, regular rhythm and normal heart sounds.   No murmur heard. Pulmonary/Chest: Effort normal and breath sounds normal. He has no wheezes.  Musculoskeletal: He exhibits no edema.  Lymphadenopathy:    He has no cervical  adenopathy.  Neurological: He is alert and oriented to person, place, and time. No cranial nerve deficit.  Skin:  Maculopapular rash distal forearms bilaterally  Psychiatric: He has a normal mood and affect.          Assessment & Plan:

## 2012-05-07 NOTE — Patient Instructions (Addendum)
Please call your sleep specialist to evaluate CPAP pressure setting

## 2012-05-22 ENCOUNTER — Telehealth: Payer: Self-pay | Admitting: Internal Medicine

## 2012-05-22 ENCOUNTER — Encounter: Payer: Self-pay | Admitting: Internal Medicine

## 2012-05-22 ENCOUNTER — Ambulatory Visit (INDEPENDENT_AMBULATORY_CARE_PROVIDER_SITE_OTHER): Payer: Medicare PPO | Admitting: Internal Medicine

## 2012-05-22 DIAGNOSIS — I10 Essential (primary) hypertension: Secondary | ICD-10-CM

## 2012-05-22 DIAGNOSIS — E669 Obesity, unspecified: Secondary | ICD-10-CM

## 2012-05-22 MED ORDER — NEBIVOLOL HCL 10 MG PO TABS: 10.0000 mg | ORAL_TABLET | Freq: Every day | ORAL | Status: DC

## 2012-05-22 MED ORDER — PRAVASTATIN SODIUM 40 MG PO TABS: 40.0000 mg | ORAL_TABLET | Freq: Every day | ORAL | Status: DC

## 2012-05-22 MED ORDER — OLMESARTAN-AMLODIPINE-HCTZ 40-10-25 MG PO TABS: 1.0000 | ORAL_TABLET | Freq: Every day | ORAL | Status: DC

## 2012-05-22 NOTE — Telephone Encounter (Signed)
Pt requesting to continue to see Dr. Yoo and would like to change his PCP to reflect.  °

## 2012-05-22 NOTE — Telephone Encounter (Signed)
Ok with me 

## 2012-05-22 NOTE — Patient Instructions (Addendum)
Please complete the following lab tests before your next follow up appointment: BMET - 401.9 A1c - 790.29 FLP, LFTs - 272.4 

## 2012-05-22 NOTE — Assessment & Plan Note (Signed)
Patient's blood pressure is still poorly controlled. Increase Tribenzor dose to 40/10/25 mg. Coreg CR is cost prohibitive. Change to Bystolic 10 mg. Patient's 10 year cardiovascular risk is 10.5%. Start pravastatin 40 mg once daily.

## 2012-05-22 NOTE — Assessment & Plan Note (Addendum)
Refer to [facility] for consideration of weight loss surgery.   Patient has multiple comorbidities including hypertension, obstructive sleep apnea, and prediabetes.

## 2012-05-22 NOTE — Progress Notes (Signed)
Subjective:    Patient ID: Tommy Jackson, male    DOB: 1955-08-05, 57 y.o.   MRN: [id number]  HPI  57 year old white male previously seen for uncontrolled hypertension for followup. Despite changing medications his home blood pressure readings are still elevated. Systolic blood pressure readings range from 140-170. Patient still having issues with CPAP machine.  Patient reports carvedilol is cost prohibitive.  Patient has been struggling with morbid obesity for many years. He has been able to lose weight for short period of time. His lowest weight was approximately 240 pounds. He has been unable to maintain weight loss. He has multiple comorbidities secondary to morbid obesity.  Review of Systems Negative for chest pain or shortness of breath  Past Medical History  Diagnosis Date  . HYPERTENSION 11/29/2005  . HYPERLIPIDEMIA 02/07/2007  . OBESITY 10/01/2007  . SLEEP APNEA, OBSTRUCTIVE 10/01/2007  . ASTHMA 09/07/2009  . Herniated disc     History   Social History  . Marital Status: Single    Spouse Name: N/A    Number of Children: 3  . Years of Education: N/A   Occupational History  . retired truck Mining engineer term disability    Social History Main Topics  . Smoking status: Never Smoker   . Smokeless tobacco: Never Used  . Alcohol Use: 0.0 oz/week  . Drug Use: No  . Sexually Active: Not on file   Other Topics Concern  . Not on file   Social History Narrative  . No narrative on file    Past Surgical History  Procedure Laterality Date  . Shoulder surgery      right  . Elbow surgery      right    Family History  Problem Relation Age of Onset  . Colon cancer Father   . Lung cancer Father   . CAD Father   . Diabetes Father   . Heart attack Father   . Colon cancer Brother   . Heart attack Paternal Grandfather   . CAD Brother     Allergies  Allergen Reactions  . Aspirin     REACTION: unspecified  . Glycerin Nausea Only  . Penicillins     REACTION: family  hx    Current Outpatient Prescriptions on File Prior to Visit  Medication Sig Dispense Refill  . albuterol (VENTOLIN HFA) 108 (90 BASE) MCG/ACT inhaler Inhale 2 puffs into the lungs every 4 (four) hours as needed.        . diazepam (VALIUM) 10 MG tablet Take 1 tablet (10 mg total) by mouth every 12 (twelve) hours as needed for anxiety.  10 tablet  1  . HYDROcodone-acetaminophen (VICODIN ES) 7.5-750 MG per tablet Take 1 tablet by mouth every 6 (six) hours as needed. Patient used this medication for pain in his arm.      . meloxicam (MOBIC) 15 MG tablet Take 1 tablet by mouth daily.      . mometasone (NASONEX) 50 MCG/ACT nasal spray Place 2 sprays into the nose daily as needed.  17 g  1  . Mometasone Furo-Formoterol Fum (DULERA) 100-5 MCG/ACT AERO Inhale 2 puffs into the lungs 2 (two) times daily.  1 Inhaler  6  . Multiple Vitamin (MULTIVITAMIN) capsule Take 1 capsule by mouth daily.        Marland Kitchen nystatin (MYCOSTATIN) 100000 UNIT/ML suspension Take 5 mLs (500,000 Units total) by mouth 4 (four) times daily.  60 mL  1  . triamcinolone cream (KENALOG) 0.1 % Apply topically 2 (two) times  daily.  30 g  1   No current facility-administered medications on file prior to visit.    BP 140/90  Pulse 68  Temp(Src) 98.7 F (37.1 C) (Oral)  Wt 311 lb (141.069 kg)  BMI 42.17 kg/m2       Objective:   Physical Exam  Constitutional: He appears well-developed and well-nourished. No distress.  Cardiovascular: Normal rate, regular rhythm and normal heart sounds.   No murmur heard. Pulmonary/Chest: Effort normal. He has no wheezes.  Abdominal:  Abdominal obesity  Musculoskeletal: He exhibits no edema.  Neurological: He is alert. No cranial nerve deficit.  Skin: Skin is warm and dry.  Psychiatric: He has a normal mood and affect. His behavior is normal.          Assessment & Plan:

## 2012-05-23 NOTE — Telephone Encounter (Signed)
ok 

## 2012-06-24 ENCOUNTER — Ambulatory Visit: Payer: Medicare PPO | Admitting: Internal Medicine

## 2012-07-04 ENCOUNTER — Ambulatory Visit (INDEPENDENT_AMBULATORY_CARE_PROVIDER_SITE_OTHER): Payer: Medicare PPO | Admitting: Internal Medicine

## 2012-07-04 VITALS — BP 142/98 | HR 68 | Temp 97.9°F | Wt 317.0 lb

## 2012-07-04 DIAGNOSIS — I10 Essential (primary) hypertension: Secondary | ICD-10-CM

## 2012-07-04 DIAGNOSIS — R7309 Other abnormal glucose: Secondary | ICD-10-CM

## 2012-07-04 DIAGNOSIS — N400 Enlarged prostate without lower urinary tract symptoms: Secondary | ICD-10-CM

## 2012-07-04 LAB — BASIC METABOLIC PANEL
BUN: 19 mg/dL (ref 6–23)
CO2: 31 mEq/L (ref 19–32)
Calcium: 8.9 mg/dL (ref 8.4–10.5)
Glucose, Bld: 93 mg/dL (ref 70–99)
Potassium: 4 mEq/L (ref 3.5–5.1)
Sodium: 142 mEq/L (ref 135–145)

## 2012-07-04 MED ORDER — NYSTATIN 100000 UNIT/ML MT SUSP: 500000.0000 [IU] | Freq: Four times a day (QID) | OROMUCOSAL | Status: DC

## 2012-07-04 MED ORDER — NEBIVOLOL HCL 10 MG PO TABS: 10.0000 mg | ORAL_TABLET | Freq: Every day | ORAL | Status: DC

## 2012-07-04 MED ORDER — OLMESARTAN-AMLODIPINE-HCTZ 40-10-25 MG PO TABS: 1.0000 | ORAL_TABLET | Freq: Every day | ORAL | Status: DC

## 2012-07-04 NOTE — Assessment & Plan Note (Addendum)
BP improved.  Continue same dose.  Reassess in 2 months.  Monitor electrolytes and kidney function. BP: 142/98 mmHg

## 2012-07-04 NOTE — Progress Notes (Signed)
Subjective:    Patient ID: Tommy Jackson, male    DOB: 1955/07/24, 57 y.o.   MRN: [id number]  HPI  57 year old white male for followup regarding uncontrolled hypertension. Patient now taking Games developer. Patient reports home blood pressure readings much improved. Systolic blood pressure readings at home in the 140s, diastolic 80s to 90s.  Patient took his last dose of Bystolic yesterday.  Wife reports increased stress at home.  Her brother experiencing significant health problems.  Review of Systems Negative for chest pain or shortness of breath    Past Medical History  Diagnosis Date  . HYPERTENSION 11/29/2005  . HYPERLIPIDEMIA 02/07/2007  . OBESITY 10/01/2007  . SLEEP APNEA, OBSTRUCTIVE 10/01/2007  . ASTHMA 09/07/2009  . Herniated disc     History   Social History  . Marital Status: Single    Spouse Name: N/A    Number of Children: 3  . Years of Education: N/A   Occupational History  . retired truck Mining engineer term disability    Social History Main Topics  . Smoking status: Never Smoker   . Smokeless tobacco: Never Used  . Alcohol Use: 0.0 oz/week  . Drug Use: No  . Sexually Active: Not on file   Other Topics Concern  . Not on file   Social History Narrative  . No narrative on file    Past Surgical History  Procedure Laterality Date  . Shoulder surgery      right  . Elbow surgery      right    Family History  Problem Relation Age of Onset  . Colon cancer Father   . Lung cancer Father   . CAD Father   . Diabetes Father   . Heart attack Father   . Colon cancer Brother   . Heart attack Paternal Grandfather   . CAD Brother     Allergies  Allergen Reactions  . Aspirin     REACTION: unspecified  . Glycerin Nausea Only  . Penicillins     REACTION: family hx    Current Outpatient Prescriptions on File Prior to Visit  Medication Sig Dispense Refill  . albuterol (VENTOLIN HFA) 108 (90 BASE) MCG/ACT inhaler Inhale 2 puffs into the lungs  every 4 (four) hours as needed.        . diazepam (VALIUM) 10 MG tablet Take 1 tablet (10 mg total) by mouth every 12 (twelve) hours as needed for anxiety.  10 tablet  1  . HYDROcodone-acetaminophen (VICODIN ES) 7.5-750 MG per tablet Take 1 tablet by mouth every 6 (six) hours as needed. Patient used this medication for pain in his arm.      . meloxicam (MOBIC) 15 MG tablet Take 1 tablet by mouth daily.      . mometasone (NASONEX) 50 MCG/ACT nasal spray Place 2 sprays into the nose daily as needed.  17 g  1  . Mometasone Furo-Formoterol Fum (DULERA) 100-5 MCG/ACT AERO Inhale 2 puffs into the lungs 2 (two) times daily.  1 Inhaler  6  . Multiple Vitamin (MULTIVITAMIN) capsule Take 1 capsule by mouth daily.        . pravastatin (PRAVACHOL) 40 MG tablet Take 1 tablet (40 mg total) by mouth daily.  90 tablet  1  . triamcinolone cream (KENALOG) 0.1 % Apply topically 2 (two) times daily.  30 g  1   No current facility-administered medications on file prior to visit.    BP 142/98  Pulse 68  Temp(Src) 97.9 F (36.6  C) (Oral)  Wt 317 lb (143.79 kg)  BMI 42.98 kg/m2    Objective:   Physical Exam  Constitutional: He appears well-developed and well-nourished.  Cardiovascular: Normal rate, regular rhythm and normal heart sounds.   Pulmonary/Chest: Effort normal and breath sounds normal. He has no wheezes.  Musculoskeletal: He exhibits no edema.  Skin: Skin is warm and dry.  Psychiatric: He has a normal mood and affect. His behavior is normal.          Assessment & Plan:

## 2012-07-04 NOTE — Patient Instructions (Addendum)
Please complete the following lab tests before your next follow up appointment: FLP, LFTs - 272.4 

## 2012-07-05 ENCOUNTER — Telehealth: Payer: Self-pay | Admitting: Internal Medicine

## 2012-07-05 MED ORDER — NEBIVOLOL HCL 10 MG PO TABS: 10.0000 mg | ORAL_TABLET | Freq: Every day | ORAL | Status: DC

## 2012-07-05 NOTE — Telephone Encounter (Signed)
rx sent in electronically 

## 2012-07-05 NOTE — Telephone Encounter (Signed)
PT called to state that he didn't receive his RX for nebivolol (BYSTOLIC) 10 MG tablet. In the past he has received samples but as of yesterday, Dr. Artist Pais stated that he would be calling in a RX for this along with his others to [facility] [address] [place]. He did receive every other medication. Please assist.

## 2012-09-03 ENCOUNTER — Ambulatory Visit: Payer: Medicare PPO | Admitting: Internal Medicine

## 2012-10-24 ENCOUNTER — Ambulatory Visit (INDEPENDENT_AMBULATORY_CARE_PROVIDER_SITE_OTHER): Payer: Medicare PPO | Admitting: Internal Medicine

## 2012-10-24 ENCOUNTER — Ambulatory Visit: Payer: Medicare PPO | Admitting: Internal Medicine

## 2012-10-24 ENCOUNTER — Encounter: Payer: Self-pay | Admitting: Internal Medicine

## 2012-10-24 VITALS — BP 140/80 | HR 80 | Temp 99.1°F | Wt 312.0 lb

## 2012-10-24 DIAGNOSIS — J45909 Unspecified asthma, uncomplicated: Secondary | ICD-10-CM

## 2012-10-24 DIAGNOSIS — L309 Dermatitis, unspecified: Secondary | ICD-10-CM

## 2012-10-24 DIAGNOSIS — I10 Essential (primary) hypertension: Secondary | ICD-10-CM

## 2012-10-24 DIAGNOSIS — L259 Unspecified contact dermatitis, unspecified cause: Secondary | ICD-10-CM

## 2012-10-24 DIAGNOSIS — R7309 Other abnormal glucose: Secondary | ICD-10-CM

## 2012-10-24 MED ORDER — OLMESARTAN-AMLODIPINE-HCTZ 40-10-25 MG PO TABS: 1.0000 | ORAL_TABLET | Freq: Every day | ORAL | Status: DC

## 2012-10-24 MED ORDER — NEBIVOLOL HCL 10 MG PO TABS: 10.0000 mg | ORAL_TABLET | Freq: Every day | ORAL | Status: DC

## 2012-10-24 MED ORDER — TRIAMCINOLONE ACETONIDE 0.5 % EX CREA: TOPICAL_CREAM | Freq: Two times a day (BID) | CUTANEOUS | Status: DC

## 2012-10-24 MED ORDER — MOMETASONE FURO-FORMOTEROL FUM 100-5 MCG/ACT IN AERO: 2.0000 | INHALATION_SPRAY | Freq: Two times a day (BID) | RESPIRATORY_TRACT | Status: DC

## 2012-10-24 MED ORDER — PRAVASTATIN SODIUM 40 MG PO TABS: 40.0000 mg | ORAL_TABLET | Freq: Every day | ORAL | Status: DC

## 2012-10-24 NOTE — Patient Instructions (Addendum)
Contact our office if your rash on your arm does not resolve. Keep following low carbohydrate diet and work towards additional weight loss (1 lb per week)

## 2012-10-24 NOTE — Assessment & Plan Note (Signed)
Blood pressure stable. Continue same medication regimen. Monitor electrolytes and kidney function. BP: 140/80 mmHg  .

## 2012-10-24 NOTE — Assessment & Plan Note (Signed)
Patient with persistent maculopapular rash on distal aspects of forearms. I still suspect allergic dermatitis. Use higher potency triamcinolone cream for 2 weeks. If no improvement refer to dermatology for further evaluation.

## 2012-10-24 NOTE — Assessment & Plan Note (Addendum)
I recommended patient follow a low carb diet and work on weight loss. Monitor A1c. Lab Results  Component Value Date   HGBA1C 6.0 07/04/2012

## 2012-10-24 NOTE — Progress Notes (Signed)
Subjective:    Patient ID: Tommy Jackson, male    DOB: [birth year], 56 y.o.   MRN: [id number]  HPI  57 year old white male with history of uncontrolled hypertension, hyperlipidemia and asthma for followup. Patient's blood pressure has been fairly stable. He reports good medication compliance. He has less stress at home. His brother-in-law had bilateral below the knee amputations.  Asthma he has intermittent wheezing. He denies cough or shortness of breath.  He still has pruritic rash on right forearm. No other areas are affected. He used topical steroids for a few days and it seemed to help with pruritus but rash neuro awake. He denies any new environmental exposures. No new soaps or lotions.  Review of Systems Negative for chest pain or shortness of breath  Past Medical History  Diagnosis Date  . HYPERTENSION 11/29/2005  . HYPERLIPIDEMIA 02/07/2007  . OBESITY 10/01/2007  . SLEEP APNEA, OBSTRUCTIVE 10/01/2007  . ASTHMA 09/07/2009  . Herniated disc     History   Social History  . Marital Status: Single    Spouse Name: N/A    Number of Children: 3  . Years of Education: N/A   Occupational History  . retired truck Mining engineer term disability    Social History Main Topics  . Smoking status: Never Smoker   . Smokeless tobacco: Never Used  . Alcohol Use: 0.0 oz/week  . Drug Use: No  . Sexual Activity: Not on file   Other Topics Concern  . Not on file   Social History Narrative  . No narrative on file    Past Surgical History  Procedure Laterality Date  . Shoulder surgery      right  . Elbow surgery      right    Family History  Problem Relation Age of Onset  . Colon cancer Father   . Lung cancer Father   . CAD Father   . Diabetes Father   . Heart attack Father   . Colon cancer Brother   . Heart attack Paternal Grandfather   . CAD Brother     Allergies  Allergen Reactions  . Aspirin     REACTION: unspecified  . Glycerin Nausea Only  . Penicillins    REACTION: family hx    Current Outpatient Prescriptions on File Prior to Visit  Medication Sig Dispense Refill  . albuterol (VENTOLIN HFA) 108 (90 BASE) MCG/ACT inhaler Inhale 2 puffs into the lungs every 4 (four) hours as needed.        Marland Kitchen HYDROcodone-acetaminophen (VICODIN ES) 7.5-750 MG per tablet Take 1 tablet by mouth every 6 (six) hours as needed. Patient used this medication for pain in his arm.      . meloxicam (MOBIC) 15 MG tablet Take 1 tablet by mouth daily.      . Multiple Vitamin (MULTIVITAMIN) capsule Take 1 capsule by mouth daily.         No current facility-administered medications on file prior to visit.    BP 140/80  Pulse 80  Temp(Src) 99.1 F (37.3 C) (Oral)  Wt 312 lb (141.522 kg)  BMI 42.31 kg/m2         Objective:   Physical Exam  Constitutional: He is oriented to person, place, and time. He appears well-developed and well-nourished.  HENT:  Head: Normocephalic and atraumatic.  Neck: Neck supple.  Cardiovascular: Normal rate, regular rhythm and normal heart sounds.   Pulmonary/Chest: Effort normal. He has wheezes.  Faint expiratory wheeze  Neurological: He is alert and oriented  to person, place, and time. No cranial nerve deficit.  Skin: Skin is warm.  Faint maculopapular erythema over right forearm.  No vesicles or bullae,  Non tender  Psychiatric: He has a normal mood and affect. His behavior is normal.          Assessment & Plan:

## 2012-11-01 ENCOUNTER — Other Ambulatory Visit: Payer: Medicare PPO

## 2012-11-01 LAB — BASIC METABOLIC PANEL
BUN: 17 mg/dL (ref 6–23)
CO2: 32 mEq/L (ref 19–32)
Glucose, Bld: 102 mg/dL — ABNORMAL HIGH (ref 70–99)
Potassium: 4.2 mEq/L (ref 3.5–5.1)
Sodium: 139 mEq/L (ref 135–145)

## 2013-01-06 ENCOUNTER — Other Ambulatory Visit (INDEPENDENT_AMBULATORY_CARE_PROVIDER_SITE_OTHER): Payer: Medicare PPO

## 2013-01-06 DIAGNOSIS — E785 Hyperlipidemia, unspecified: Secondary | ICD-10-CM

## 2013-01-06 DIAGNOSIS — I10 Essential (primary) hypertension: Secondary | ICD-10-CM

## 2013-01-06 DIAGNOSIS — R7309 Other abnormal glucose: Secondary | ICD-10-CM

## 2013-01-06 LAB — BASIC METABOLIC PANEL
BUN: 14 mg/dL (ref 6–23)
Chloride: 101 mEq/L (ref 96–112)
Glucose, Bld: 103 mg/dL — ABNORMAL HIGH (ref 70–99)
Potassium: 3.9 mEq/L (ref 3.5–5.1)

## 2013-01-06 LAB — HEPATIC FUNCTION PANEL
ALT: 39 U/L (ref 0–53)
AST: 26 U/L (ref 0–37)
Albumin: 4.3 g/dL (ref 3.5–5.2)
Alkaline Phosphatase: 38 U/L — ABNORMAL LOW (ref 39–117)
Total Protein: 7.2 g/dL (ref 6.0–8.3)

## 2013-01-13 ENCOUNTER — Ambulatory Visit (INDEPENDENT_AMBULATORY_CARE_PROVIDER_SITE_OTHER): Payer: Medicare PPO | Admitting: Internal Medicine

## 2013-01-13 ENCOUNTER — Encounter: Payer: Self-pay | Admitting: Internal Medicine

## 2013-01-13 VITALS — BP 148/86 | HR 76 | Temp 99.0°F | Ht 72.0 in | Wt 316.0 lb

## 2013-01-13 DIAGNOSIS — E785 Hyperlipidemia, unspecified: Secondary | ICD-10-CM

## 2013-01-13 DIAGNOSIS — I10 Essential (primary) hypertension: Secondary | ICD-10-CM

## 2013-01-13 DIAGNOSIS — J209 Acute bronchitis, unspecified: Secondary | ICD-10-CM

## 2013-01-13 DIAGNOSIS — J45909 Unspecified asthma, uncomplicated: Secondary | ICD-10-CM

## 2013-01-13 DIAGNOSIS — R7309 Other abnormal glucose: Secondary | ICD-10-CM

## 2013-01-13 MED ORDER — DOXYCYCLINE HYCLATE 100 MG PO TABS: 100.0000 mg | ORAL_TABLET | Freq: Two times a day (BID) | ORAL | Status: DC

## 2013-01-13 MED ORDER — MOMETASONE FURO-FORMOTEROL FUM 200-5 MCG/ACT IN AERO: 2.0000 | INHALATION_SPRAY | Freq: Two times a day (BID) | RESPIRATORY_TRACT | Status: DC

## 2013-01-13 NOTE — Progress Notes (Signed)
Pre visit review using our clinic review tool, if applicable. No additional management support is needed unless otherwise documented below in the visit note. 

## 2013-01-13 NOTE — Patient Instructions (Signed)
Limit your carbohydrates to 30 grams per meal (100 grams per day) Exercise on a regular basis Please contact our office if your symptoms do not improve or gets worse.

## 2013-01-13 NOTE — Progress Notes (Signed)
Subjective:    Patient ID: Tommy Jackson, male    DOB: [birth year], 57 y.o.   MRN: [id number]  HPI  57 year old white male with hx of morbid obesity, pre diabetes and hypertension for follow up.  Patient doing well.  He forgot to take his BP medication today.  He notes good medication compliance.  His weight is stable.  He is not following low carb diet.  He also reports onset of sore throat and cough 48 hrs ago.   He has hx of asthma.  He has been using his Dulera maintenance inhaler. He reports chest tightness.   Review of Systems Negative for fever or shortness of breath.  No change in vision.  No numbness and tingling is his feet.    Past Medical History  Diagnosis Date  . HYPERTENSION 11/29/2005  . HYPERLIPIDEMIA 02/07/2007  . OBESITY 10/01/2007  . SLEEP APNEA, OBSTRUCTIVE 10/01/2007  . ASTHMA 09/07/2009  . Herniated disc     History   Social History  . Marital Status: Single    Spouse Name: N/A    Number of Children: 3  . Years of Education: N/A   Occupational History  . retired truck Mining engineer term disability    Social History Main Topics  . Smoking status: Never Smoker   . Smokeless tobacco: Never Used  . Alcohol Use: 0.0 oz/week  . Drug Use: No  . Sexual Activity: Not on file   Other Topics Concern  . Not on file   Social History Narrative  . No narrative on file    Past Surgical History  Procedure Laterality Date  . Shoulder surgery      right  . Elbow surgery      right    Family History  Problem Relation Age of Onset  . Colon cancer Father   . Lung cancer Father   . CAD Father   . Diabetes Father   . Heart attack Father   . Colon cancer Brother   . Heart attack Paternal Grandfather   . CAD Brother     Allergies  Allergen Reactions  . Aspirin     REACTION: unspecified  . Glycerin Nausea Only  . Penicillins     REACTION: family hx    Current Outpatient Prescriptions on File Prior to Visit  Medication Sig Dispense Refill  .  albuterol (VENTOLIN HFA) 108 (90 BASE) MCG/ACT inhaler Inhale 2 puffs into the lungs every 4 (four) hours as needed.        Marland Kitchen HYDROcodone-acetaminophen (VICODIN ES) 7.5-750 MG per tablet Take 1 tablet by mouth every 6 (six) hours as needed. Patient used this medication for pain in his arm.      . meloxicam (MOBIC) 15 MG tablet Take 1 tablet by mouth daily.      . Multiple Vitamin (MULTIVITAMIN) capsule Take 1 capsule by mouth daily.        . nebivolol (BYSTOLIC) 10 MG tablet Take 1 tablet (10 mg total) by mouth daily.  90 tablet  1  . Olmesartan-Amlodipine-HCTZ (TRIBENZOR) 40-10-25 MG TABS Take 1 tablet by mouth daily.  90 tablet  1  . pravastatin (PRAVACHOL) 40 MG tablet Take 1 tablet (40 mg total) by mouth daily.  90 tablet  1  . triamcinolone cream (KENALOG) 0.5 % Apply topically 2 (two) times daily.  30 g  1   No current facility-administered medications on file prior to visit.    BP 148/86  Pulse 76  Temp(Src) 99 F (  37.2 C) (Oral)  Ht 6' (1.829 m)  Wt 316 lb (143.337 kg)  BMI 42.85 kg/m2    Objective:   Physical Exam  Constitutional: He is oriented to person, place, and time. He appears well-developed and well-nourished. No distress.  HENT:  Head: Normocephalic and atraumatic.  Right Ear: External ear normal.  Left Ear: External ear normal.  Oropharyngeal erythema  Neck: Neck supple.  Cardiovascular: Normal rate, regular rhythm and normal heart sounds.   No murmur heard. Pulmonary/Chest: Effort normal. He has wheezes.  Musculoskeletal: He exhibits no edema.  Lymphadenopathy:    He has no cervical adenopathy.  Neurological: He is alert and oriented to person, place, and time. No cranial nerve deficit.  Psychiatric: He has a normal mood and affect. His behavior is normal.      Assessment & Plan:

## 2013-01-14 ENCOUNTER — Telehealth: Payer: Self-pay | Admitting: Internal Medicine

## 2013-01-14 DIAGNOSIS — J209 Acute bronchitis, unspecified: Secondary | ICD-10-CM | POA: Insufficient documentation

## 2013-01-14 NOTE — Assessment & Plan Note (Signed)
I stressed importance with low carb diet and regular exercise.  If no change in weight, we discussed possible weight loss surgery.

## 2013-01-14 NOTE — Telephone Encounter (Signed)
Pt was seen yesterday and now has a cough. Pt would like cough med call into [facility] [address] [place]

## 2013-01-14 NOTE — Assessment & Plan Note (Signed)
BP sporadically elevated today.   He did not take his medications today.  I stressed importance of compliance.  We discussed elevated risk of cardiovascular complications.

## 2013-01-14 NOTE — Assessment & Plan Note (Signed)
Treat with doxycycline 100 mg twice daily.  Increase Dulera dose.  Reassess in 2 weeks.  Patient advised to call office if symptoms persist or worsen.

## 2013-01-15 NOTE — Telephone Encounter (Signed)
Pt aware, rx called in  

## 2013-01-15 NOTE — Telephone Encounter (Signed)
Use mucinex DM OTC.  If persistent cough, call in hycodan 120 ml. 5 ml bid prn.  No RF.

## 2013-02-05 ENCOUNTER — Encounter: Payer: Self-pay | Admitting: Internal Medicine

## 2013-02-05 ENCOUNTER — Ambulatory Visit (INDEPENDENT_AMBULATORY_CARE_PROVIDER_SITE_OTHER): Payer: Medicare PPO | Admitting: Internal Medicine

## 2013-02-05 VITALS — BP 156/100 | HR 76 | Temp 98.1°F | Ht 72.0 in | Wt 312.0 lb

## 2013-02-05 DIAGNOSIS — R7309 Other abnormal glucose: Secondary | ICD-10-CM

## 2013-02-05 DIAGNOSIS — J45909 Unspecified asthma, uncomplicated: Secondary | ICD-10-CM

## 2013-02-05 DIAGNOSIS — I10 Essential (primary) hypertension: Secondary | ICD-10-CM

## 2013-02-05 MED ORDER — NEBIVOLOL HCL 20 MG PO TABS: 20.0000 mg | ORAL_TABLET | Freq: Every day | ORAL | Status: DC

## 2013-02-05 NOTE — Patient Instructions (Signed)
Please complete the following lab tests before your next follow up appointment: BMET - 401.9 A1c - 790.29 

## 2013-02-05 NOTE — Assessment & Plan Note (Addendum)
Monitor A1c before next office visit.  I encouraged low carb diet and regular exercise. Lab Results  Component Value Date   HGBA1C 6.1 01/06/2013

## 2013-02-05 NOTE — Progress Notes (Signed)
Subjective:    Patient ID: Tommy Jackson, male    DOB: 1955/04/25, 57 y.o.   MRN: [id number]  HPI  56 year old white male with history of morbid obesity, hypertension, and abnormal glucose routine followup. At previous visit patient was seen for acute bronchitis. Patient reports his symptoms resolved after finishing course of doxycycline. He has no further issues with wheezing or cough.  Hypertension-his blood pressures persistently elevated despite multiple antihypertensives.  Abnormal glucose - mild weight loss since previous visit.  Review of Systems Negative for chest pain or shortness of breath    Past Medical History  Diagnosis Date  . HYPERTENSION 11/29/2005  . HYPERLIPIDEMIA 02/07/2007  . OBESITY 10/01/2007  . SLEEP APNEA, OBSTRUCTIVE 10/01/2007  . ASTHMA 09/07/2009  . Herniated disc     History   Social History  . Marital Status: Single    Spouse Name: N/A    Number of Children: 3  . Years of Education: N/A   Occupational History  . retired truck Mining engineer term disability    Social History Main Topics  . Smoking status: Never Smoker   . Smokeless tobacco: Never Used  . Alcohol Use: 0.0 oz/week  . Drug Use: No  . Sexual Activity: Not on file   Other Topics Concern  . Not on file   Social History Narrative  . No narrative on file    Past Surgical History  Procedure Laterality Date  . Shoulder surgery      right  . Elbow surgery      right    Family History  Problem Relation Age of Onset  . Colon cancer Father   . Lung cancer Father   . CAD Father   . Diabetes Father   . Heart attack Father   . Colon cancer Brother   . Heart attack Paternal Grandfather   . CAD Brother     Allergies  Allergen Reactions  . Aspirin     REACTION: unspecified  . Glycerin Nausea Only  . Penicillins     REACTION: family hx    Current Outpatient Prescriptions on File Prior to Visit  Medication Sig Dispense Refill  . albuterol (VENTOLIN HFA) 108 (90 BASE)  MCG/ACT inhaler Inhale 2 puffs into the lungs every 4 (four) hours as needed.        Marland Kitchen HYDROcodone-acetaminophen (VICODIN ES) 7.5-750 MG per tablet Take 1 tablet by mouth every 6 (six) hours as needed. Patient used this medication for pain in his arm.      . meloxicam (MOBIC) 15 MG tablet Take 1 tablet by mouth daily.      . Multiple Vitamin (MULTIVITAMIN) capsule Take 1 capsule by mouth daily.        . Olmesartan-Amlodipine-HCTZ (TRIBENZOR) 40-10-25 MG TABS Take 1 tablet by mouth daily.  90 tablet  1  . pravastatin (PRAVACHOL) 40 MG tablet Take 1 tablet (40 mg total) by mouth daily.  90 tablet  1  . triamcinolone cream (KENALOG) 0.5 % Apply topically 2 (two) times daily.  30 g  1   No current facility-administered medications on file prior to visit.    BP 156/100  Pulse 76  Temp(Src) 98.1 F (36.7 C) (Oral)  Ht 6' (1.829 m)  Wt 312 lb (141.522 kg)  BMI 42.31 kg/m2    Objective:   Physical Exam  Constitutional: He is oriented to person, place, and time. He appears well-developed and well-nourished. No distress.  HENT:  Head: Normocephalic and atraumatic.  Neck: Neck supple.  Cardiovascular: Normal rate, regular rhythm and normal heart sounds.   No murmur heard. Pulmonary/Chest: Effort normal and breath sounds normal. He has no wheezes.  Musculoskeletal:  Trace lower extremity edema bilaterally  Lymphadenopathy:    He has no cervical adenopathy.  Neurological: He is alert and oriented to person, place, and time. No cranial nerve deficit.  Skin: Skin is warm and dry.  Psychiatric: He has a normal mood and affect. His behavior is normal.          Assessment & Plan:

## 2013-02-05 NOTE — Progress Notes (Signed)
Pre visit review using our clinic review tool, if applicable. No additional management support is needed unless otherwise documented below in the visit note. 

## 2013-02-05 NOTE — Assessment & Plan Note (Signed)
Recent exacerbation resolved with course of doxycycline. Discontinue Dulera. If he has recurrence of wheezing or cough, consider start Qvar.

## 2013-02-05 NOTE — Assessment & Plan Note (Signed)
Blood pressure is still suboptimally controlled.  Increase Bystolic to  20 mg.

## 2013-02-13 ENCOUNTER — Telehealth: Payer: Self-pay | Admitting: Internal Medicine

## 2013-02-13 NOTE — Telephone Encounter (Signed)
Received 4 pages from [facility], sent to Dr. Amador Cunas at [facility]. 02/13/13/ss

## 2013-02-25 ENCOUNTER — Telehealth: Payer: Self-pay | Admitting: Internal Medicine

## 2013-02-25 NOTE — Telephone Encounter (Signed)
Pt has bronchitis again and would like to know if dr Artist Pais would call in doxycycline (VIBRA-TABS) 100 MG tablet No appt in the office  [facility] [place]

## 2013-02-26 NOTE — Telephone Encounter (Signed)
Pt went to urgent care.

## 2013-02-26 NOTE — Telephone Encounter (Signed)
No.  Pt has to be seen if bronchitis is back.  i suggest cxr before ov.

## 2013-02-27 HISTORY — PX: OTHER SURGICAL HISTORY: SHX169

## 2013-04-02 ENCOUNTER — Other Ambulatory Visit (INDEPENDENT_AMBULATORY_CARE_PROVIDER_SITE_OTHER): Payer: Medicare PPO

## 2013-04-02 DIAGNOSIS — R7309 Other abnormal glucose: Secondary | ICD-10-CM

## 2013-04-02 DIAGNOSIS — I10 Essential (primary) hypertension: Secondary | ICD-10-CM

## 2013-04-02 LAB — BASIC METABOLIC PANEL
BUN: 16 mg/dL (ref 6–23)
CALCIUM: 9.1 mg/dL (ref 8.4–10.5)
CO2: 29 mEq/L (ref 19–32)
CREATININE: 0.8 mg/dL (ref 0.4–1.5)
Chloride: 102 mEq/L (ref 96–112)
GFR: 110.79 mL/min (ref >60.00)
Glucose, Bld: 99 mg/dL (ref 70–99)
Potassium: 3.7 mEq/L (ref 3.5–5.1)
Sodium: 139 mEq/L (ref 135–145)

## 2013-04-02 LAB — HEMOGLOBIN A1C: HEMOGLOBIN A1C: 6.1 % (ref 4.6–6.5)

## 2013-04-09 ENCOUNTER — Encounter: Payer: Self-pay | Admitting: Internal Medicine

## 2013-04-09 ENCOUNTER — Ambulatory Visit (INDEPENDENT_AMBULATORY_CARE_PROVIDER_SITE_OTHER): Payer: Medicare PPO | Admitting: Internal Medicine

## 2013-04-09 VITALS — BP 114/80 | HR 72 | Temp 98.3°F | Wt 321.0 lb

## 2013-04-09 DIAGNOSIS — J309 Allergic rhinitis, unspecified: Secondary | ICD-10-CM

## 2013-04-09 DIAGNOSIS — I10 Essential (primary) hypertension: Secondary | ICD-10-CM

## 2013-04-09 DIAGNOSIS — R7309 Other abnormal glucose: Secondary | ICD-10-CM

## 2013-04-09 DIAGNOSIS — J45909 Unspecified asthma, uncomplicated: Secondary | ICD-10-CM

## 2013-04-09 MED ORDER — PRAVASTATIN SODIUM 40 MG PO TABS: 40.0000 mg | ORAL_TABLET | Freq: Every day | ORAL | Status: DC

## 2013-04-09 MED ORDER — FLUTICASONE PROPIONATE 50 MCG/ACT NA SUSP: 2.0000 | Freq: Every day | NASAL | Status: DC

## 2013-04-09 MED ORDER — AZELASTINE HCL 0.1 % NA SOLN: 2.0000 | Freq: Two times a day (BID) | NASAL | Status: DC

## 2013-04-09 MED ORDER — OLMESARTAN-AMLODIPINE-HCTZ 40-10-25 MG PO TABS: 1.0000 | ORAL_TABLET | Freq: Every day | ORAL | Status: DC

## 2013-04-09 NOTE — Assessment & Plan Note (Signed)
Improved.  BP at goal.  BP: 114/80 mmHg  Continue same medication regimen.

## 2013-04-09 NOTE — Progress Notes (Signed)
Subjective:    Patient ID: Tommy Jackson, male    DOB: Sep 29, 1955, 58 y.o.   MRN: [id number]  HPI  58 year old white male with morbid obesity, hypertension and abnormal glucose for followup. Patient reports good compliance with his medications. His previous cough has completely resolved. His blood pressure readings much improved.  Unfortunately he has gained approximately 9 pounds since previous visit. However his A1c is stable.    Patient complains of chronic nasal congestion. It is worse at night. He has difficulty using his CPAP.  He is occasionally using over-the-counter Afrin.  No sinus pain or purulent nasal discharge.  Lab Results  Component Value Date   HGBA1C 6.1 04/02/2013      Review of Systems Negative for chest pain.  Negative for cough    Past Medical History  Diagnosis Date  . HYPERTENSION 11/29/2005  . HYPERLIPIDEMIA 02/07/2007  . OBESITY 10/01/2007  . SLEEP APNEA, OBSTRUCTIVE 10/01/2007  . ASTHMA 09/07/2009  . Herniated disc     History   Social History  . Marital Status: Single    Spouse Name: N/A    Number of Children: 3  . Years of Education: N/A   Occupational History  . retired truck Chief Technology Officer term disability    Social History Main Topics  . Smoking status: Never Smoker   . Smokeless tobacco: Never Used  . Alcohol Use: 0.0 oz/week  . Drug Use: No  . Sexual Activity: Not on file   Other Topics Concern  . Not on file   Social History Narrative  . No narrative on file    Past Surgical History  Procedure Laterality Date  . Shoulder surgery      right  . Elbow surgery      right    Family History  Problem Relation Age of Onset  . Colon cancer Father   . Lung cancer Father   . CAD Father   . Diabetes Father   . Heart attack Father   . Colon cancer Brother   . Heart attack Paternal Grandfather   . CAD Brother     Allergies  Allergen Reactions  . Aspirin     REACTION: unspecified  . Glycerin Nausea Only  . Penicillins    REACTION: family hx    Current Outpatient Prescriptions on File Prior to Visit  Medication Sig Dispense Refill  . albuterol (VENTOLIN HFA) 108 (90 BASE) MCG/ACT inhaler Inhale 2 puffs into the lungs every 4 (four) hours as needed.        Marland Kitchen HYDROcodone-acetaminophen (VICODIN ES) 7.5-750 MG per tablet Take 1 tablet by mouth every 6 (six) hours as needed. Patient used this medication for pain in his arm.      . meloxicam (MOBIC) 15 MG tablet Take 1 tablet by mouth daily.      . Multiple Vitamin (MULTIVITAMIN) capsule Take 1 capsule by mouth daily.        . Nebivolol HCl (BYSTOLIC) 20 MG TABS Take 1 tablet (20 mg total) by mouth daily.  90 tablet  1  . triamcinolone cream (KENALOG) 0.5 % Apply topically 2 (two) times daily.  30 g  1   No current facility-administered medications on file prior to visit.    BP 114/80  Pulse 72  Temp(Src) 98.3 F (36.8 C) (Oral)  Wt 321 lb (145.605 kg)    Objective:   Physical Exam  Constitutional: He is oriented to person, place, and time. He appears well-developed and well-nourished. No distress.  HENT:  Head: Normocephalic and atraumatic.  Right Ear: External ear normal.  Left Ear: External ear normal.  Mouth/Throat: Oropharynx is clear and moist.  Mild nasal mucosa edema  Eyes: EOM are normal. Pupils are equal, round, and reactive to light.  Neck: Neck supple.  Cardiovascular: Normal rate, regular rhythm and normal heart sounds.   No murmur heard. Pulmonary/Chest: Effort normal and breath sounds normal. He has no wheezes.  Lymphadenopathy:    He has no cervical adenopathy.  Neurological: He is alert and oriented to person, place, and time. No cranial nerve deficit.  Skin: Skin is warm and dry.  Psychiatric: He has a normal mood and affect. His behavior is normal.          Assessment & Plan:

## 2013-04-09 NOTE — Assessment & Plan Note (Signed)
Chronic nasal congestion.  Worse at night.  Trial of flonase and astelin.  Avoid OTC afrin.

## 2013-04-09 NOTE — Patient Instructions (Signed)
Please complete the following lab tests before your next follow up appointment: BMET - 401.9 A1c - 790.29

## 2013-04-09 NOTE — Assessment & Plan Note (Signed)
No persistent wheezing.  Monitor for now.  No maintenance inhalers.

## 2013-04-09 NOTE — Assessment & Plan Note (Signed)
Stable. Continue to monitor A1c.  I urged low carb diet and regular exercise. Lab Results  Component Value Date   HGBA1C 6.1 04/02/2013

## 2013-04-09 NOTE — Progress Notes (Signed)
Pre visit review using our clinic review tool, if applicable. No additional management support is needed unless otherwise documented below in the visit note. 

## 2013-04-10 ENCOUNTER — Telehealth: Payer: Self-pay | Admitting: Internal Medicine

## 2013-04-10 NOTE — Telephone Encounter (Signed)
Relevant patient education mailed to patient.  

## 2013-08-04 ENCOUNTER — Ambulatory Visit (HOSPITAL_COMMUNITY)
Admission: RE | Admit: 2013-08-04 | Discharge: 2013-08-04 | Disposition: A | Discharge status: Home / Self Care | Payer: Medicare PPO | Source: Ambulatory Visit | Attending: Internal Medicine | Admitting: Internal Medicine

## 2013-08-04 ENCOUNTER — Ambulatory Visit (INDEPENDENT_AMBULATORY_CARE_PROVIDER_SITE_OTHER): Payer: Medicare PPO | Admitting: Internal Medicine

## 2013-08-04 ENCOUNTER — Encounter: Payer: Self-pay | Admitting: Internal Medicine

## 2013-08-04 VITALS — BP 164/102 | Temp 98.3°F | Ht 72.0 in | Wt 314.0 lb

## 2013-08-04 DIAGNOSIS — M25579 Pain in unspecified ankle and joints of unspecified foot: Secondary | ICD-10-CM | POA: Insufficient documentation

## 2013-08-04 DIAGNOSIS — M25571 Pain in right ankle and joints of right foot: Secondary | ICD-10-CM | POA: Insufficient documentation

## 2013-08-04 DIAGNOSIS — R7309 Other abnormal glucose: Secondary | ICD-10-CM

## 2013-08-04 DIAGNOSIS — M25552 Pain in left hip: Secondary | ICD-10-CM

## 2013-08-04 DIAGNOSIS — I10 Essential (primary) hypertension: Secondary | ICD-10-CM

## 2013-08-04 DIAGNOSIS — M25559 Pain in unspecified hip: Secondary | ICD-10-CM | POA: Insufficient documentation

## 2013-08-04 DIAGNOSIS — M898X9 Other specified disorders of bone, unspecified site: Secondary | ICD-10-CM | POA: Insufficient documentation

## 2013-08-04 LAB — CBC WITH DIFFERENTIAL/PLATELET
BASOS PCT: 0.7 % (ref 0.0–3.0)
Basophils Absolute: 0 10*3/uL (ref 0.0–0.1)
EOS PCT: 6 % — AB (ref 0.0–5.0)
Eosinophils Absolute: 0.4 10*3/uL (ref 0.0–0.7)
HCT: 42.5 % (ref 39.0–52.0)
Hemoglobin: 14.5 g/dL (ref 13.0–17.0)
LYMPHS ABS: 2.1 10*3/uL (ref 0.7–4.0)
Lymphocytes Relative: 33.9 % (ref 12.0–46.0)
MCHC: 34.1 g/dL (ref 30.0–36.0)
MCV: 92.7 fl (ref 78.0–100.0)
Monocytes Absolute: 0.5 10*3/uL (ref 0.1–1.0)
Monocytes Relative: 8 % (ref 3.0–12.0)
Neutro Abs: 3.2 10*3/uL (ref 1.4–7.7)
Neutrophils Relative %: 51.4 % (ref 43.0–77.0)
Platelets: 180 10*3/uL (ref 150.0–400.0)
RBC: 4.58 Mil/uL (ref 4.22–5.81)
RDW: 13 % (ref 11.5–15.5)
WBC: 6.2 10*3/uL (ref 4.0–10.5)

## 2013-08-04 LAB — BASIC METABOLIC PANEL
BUN: 16 mg/dL (ref 6–23)
CHLORIDE: 102 meq/L (ref 96–112)
CO2: 31 meq/L (ref 19–32)
Calcium: 9.5 mg/dL (ref 8.4–10.5)
Creatinine, Ser: 0.9 mg/dL (ref 0.4–1.5)
GFR: 94.85 mL/min (ref >60.00)
Glucose, Bld: 93 mg/dL (ref 70–99)
Potassium: 3.9 mEq/L (ref 3.5–5.1)
Sodium: 140 mEq/L (ref 135–145)

## 2013-08-04 LAB — URIC ACID: Uric Acid, Serum: 7.7 mg/dL (ref 4.0–7.8)

## 2013-08-04 LAB — HEMOGLOBIN A1C: Hgb A1c MFr Bld: 6.1 % (ref 4.6–6.5)

## 2013-08-04 LAB — SEDIMENTATION RATE: Sed Rate: 12 mm/hr (ref 0–22)

## 2013-08-04 MED ORDER — OLMESARTAN-AMLODIPINE-HCTZ 40-10-25 MG PO TABS: 1.0000 | ORAL_TABLET | Freq: Every day | ORAL | Status: DC

## 2013-08-04 MED ORDER — PRAVASTATIN SODIUM 40 MG PO TABS: 40.0000 mg | ORAL_TABLET | Freq: Every day | ORAL | Status: DC

## 2013-08-04 MED ORDER — NEBIVOLOL HCL 20 MG PO TABS: 20.0000 mg | ORAL_TABLET | Freq: Every day | ORAL | Status: DC

## 2013-08-04 MED ORDER — NAPROXEN SODIUM 220 MG PO CAPS: ORAL_CAPSULE | ORAL | Status: DC

## 2013-08-04 NOTE — Progress Notes (Signed)
Pre visit review using our clinic review tool, if applicable. No additional management support is needed unless otherwise documented below in the visit note. 

## 2013-08-04 NOTE — Assessment & Plan Note (Signed)
Patient's blood pressure likely sporadically elevated secondary to pain response. No change in medication regimen. Continue to monitor. BP: 164/102 mmHg

## 2013-08-04 NOTE — Assessment & Plan Note (Signed)
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  Check x ray, sed rate, CBCD and uric acid level.  I agree with not using Meloxicam long term.  Use OTC Naprosyn 220 mg once daily as needed.  He understands to use sparingly.  Weight loss encouraged.

## 2013-08-04 NOTE — Patient Instructions (Signed)
Apply cool compress to left hip twice daily for 2-3 days You can use ankle support device as discussed Use OTC Aleve - 1 tablet once daily as needed for moderate pain.

## 2013-08-04 NOTE — Assessment & Plan Note (Signed)
58 year old with left hip pain. He has tenderness near left trochanteric bursa. Unable to elicit pain with internal or or external rotation of left hip. His ymptoms likely secondary to hip bursitis. Risks and benefits discussed with patient.  He agreed to proceed with cortisone injection. Area prepped with Betadine and alcohol wipe. 3 cc of 1 to 1 1% lidocaine and 80 mg of Depo-Medrol injected utilizing sterile technique. No complications.

## 2013-08-04 NOTE — Assessment & Plan Note (Signed)
Monitor A1c 

## 2013-08-04 NOTE — Progress Notes (Signed)
Subjective:    Patient ID: Tommy Jackson, male    DOB: Mar 27, 1955, 58 y.o.   MRN: [id number]  HPI  58 year old white male with morbid obesity, hypertension and abnormal glucose for followup. Interval medical history-patient was seen by his orthopedic physician. Patient was on chronic meloxicam 15 mg once daily for over a year. His orthopedic physician recommended he stop Mobic. Since stopping medication patient has had increased musculoskeletal pains especially of his left hip and right ankle. Patient denies any specific injury or trauma.  He denies any redness of joints.  He describes chronic throbbing sensation in his right ankle especially with prolonged walking or standing. He rates severity as 3/10.  Mild right ankle swelling laterally.  Left hip pain is worse with lying on left side. His symptoms also worse with sitting in certain positions.  Hypertension-his blood pressures sporadically elevated today.  Abnormal glucose - mild weight loss  Review of Systems Mild weight loss, no fever or chills, no recent procedures    Past Medical History  Diagnosis Date  . HYPERTENSION 11/29/2005  . HYPERLIPIDEMIA 02/07/2007  . OBESITY 10/01/2007  . SLEEP APNEA, OBSTRUCTIVE 10/01/2007  . ASTHMA 09/07/2009  . Herniated disc     History   Social History  . Marital Status: Single    Spouse Name: N/A    Number of Children: 3  . Years of Education: N/A   Occupational History  . retired truck Chief Technology Officer term disability    Social History Main Topics  . Smoking status: Never Smoker   . Smokeless tobacco: Never Used  . Alcohol Use: 0.0 oz/week  . Drug Use: No  . Sexual Activity: Not on file   Other Topics Concern  . Not on file   Social History Narrative  . No narrative on file    Past Surgical History  Procedure Laterality Date  . Shoulder surgery      right  . Elbow surgery      right    Family History  Problem Relation Age of Onset  . Colon cancer Father   . Lung  cancer Father   . CAD Father   . Diabetes Father   . Heart attack Father   . Colon cancer Brother   . Heart attack Paternal Grandfather   . CAD Brother     Allergies  Allergen Reactions  . Aspirin     REACTION: unspecified  . Glycerin Nausea Only  . Penicillins     REACTION: family hx    Current Outpatient Prescriptions on File Prior to Visit  Medication Sig Dispense Refill  . albuterol (VENTOLIN HFA) 108 (90 BASE) MCG/ACT inhaler Inhale 2 puffs into the lungs every 4 (four) hours as needed.        Marland Kitchen azelastine (ASTELIN) 137 MCG/SPRAY nasal spray Place 2 sprays into both nostrils 2 (two) times daily. Use in each nostril as directed  30 mL  5  . fluticasone (FLONASE) 50 MCG/ACT nasal spray Place 2 sprays into both nostrils daily.  16 g  5  . HYDROcodone-acetaminophen (VICODIN ES) 7.5-750 MG per tablet Take 1 tablet by mouth every 6 (six) hours as needed. Patient used this medication for pain in his arm.      . meloxicam (MOBIC) 15 MG tablet Take 1 tablet by mouth daily.      . Multiple Vitamin (MULTIVITAMIN) capsule Take 1 capsule by mouth daily.        . Nebivolol HCl (BYSTOLIC) 20 MG TABS Take 1 tablet (  20 mg total) by mouth daily.  90 tablet  1  . Olmesartan-Amlodipine-HCTZ (TRIBENZOR) 40-10-25 MG TABS Take 1 tablet by mouth daily.  90 tablet  1  . pravastatin (PRAVACHOL) 40 MG tablet Take 1 tablet (40 mg total) by mouth daily.  90 tablet  1  . triamcinolone cream (KENALOG) 0.5 % Apply topically 2 (two) times daily.  30 g  1   No current facility-administered medications on file prior to visit.    BP 164/102  Temp(Src) 98.3 F (36.8 C) (Oral)  Ht 6' (1.829 m)  Wt 314 lb (142.429 kg)  BMI 42.58 kg/m2    Objective:   Physical Exam  Constitutional: He is oriented to person, place, and time. He appears well-developed and well-nourished. No distress.  HENT:  Head: Normocephalic and atraumatic.  Right Ear: External ear normal.  Left Ear: External ear normal.    Mouth/Throat: Oropharynx is clear and moist.  Eyes: Conjunctivae and EOM are normal. Pupils are equal, round, and reactive to light.  Cardiovascular: Normal rate, regular rhythm and normal heart sounds.   No murmur heard. Pulmonary/Chest: Effort normal and breath sounds normal. He has no wheezes.  Abdominal: Soft. Bowel sounds are normal. There is no tenderness.  No CVA tenderness  Musculoskeletal:  Right ankle mortise stable, mild discomfort with inversion right ankle Mild right ankle swelling.    Left hip tenderness near trochanteric bursa, no hip pain with internal or external rotation  Trace lower extremity edema bilaterally  Neurological: He is alert and oriented to person, place, and time. No cranial nerve deficit.  Psychiatric: He has a normal mood and affect. His behavior is normal.       Assessment & Plan:

## 2013-08-08 ENCOUNTER — Ambulatory Visit: Payer: Medicare PPO | Admitting: Internal Medicine

## 2013-09-01 ENCOUNTER — Ambulatory Visit: Payer: Medicare PPO | Admitting: Internal Medicine

## 2013-09-01 ENCOUNTER — Ambulatory Visit (INDEPENDENT_AMBULATORY_CARE_PROVIDER_SITE_OTHER): Payer: Medicare PPO | Admitting: Internal Medicine

## 2013-09-01 ENCOUNTER — Encounter: Payer: Self-pay | Admitting: Internal Medicine

## 2013-09-01 VITALS — BP 114/90 | Temp 98.1°F | Ht 72.0 in | Wt 310.0 lb

## 2013-09-01 DIAGNOSIS — M25562 Pain in left knee: Secondary | ICD-10-CM

## 2013-09-01 DIAGNOSIS — M79609 Pain in unspecified limb: Secondary | ICD-10-CM

## 2013-09-01 DIAGNOSIS — M25569 Pain in unspecified knee: Secondary | ICD-10-CM

## 2013-09-01 DIAGNOSIS — M79605 Pain in left leg: Secondary | ICD-10-CM | POA: Insufficient documentation

## 2013-09-01 NOTE — Progress Notes (Signed)
Pre visit review using our clinic review tool, if applicable. No additional management support is needed unless otherwise documented below in the visit note. 

## 2013-09-01 NOTE — Assessment & Plan Note (Signed)
Patient has left lower leg pain for 1 week.  He has swelling and posterior calf tenderness.  Rule out DVT.  Obtain venous doppler.

## 2013-09-01 NOTE — Patient Instructions (Signed)
Our office will contact you re: results of venous doppler as soon as they become available

## 2013-09-01 NOTE — Progress Notes (Signed)
Subjective:    Patient ID: Tommy Jackson, male    DOB: 07/08/1955, 58 y.o.   MRN: [id number]  HPI  58 year old white male with history of morbid obesity, hypertension and hyperlipidemia complains of left leg pain. Patient's symptoms started over the last one week. He has been working in his [employer] down. He reports repetitive kneeling on cushioned surface.  Patient has noticed swelling and pain of his left calf.  He also complains of lateral left knee pain. She reports injuring her year ago while getting onto the golf cart. Patient reports he heard something tear in his left knee.  He has had intermittent discomfort since. He denies any redness or swelling of knee joint.  Review of Systems Negative for chest pain, negative for shortness of breath. Negative for fever chills    Past Medical History  Diagnosis Date  . HYPERTENSION 11/29/2005  . HYPERLIPIDEMIA 02/07/2007  . OBESITY 10/01/2007  . SLEEP APNEA, OBSTRUCTIVE 10/01/2007  . ASTHMA 09/07/2009  . Herniated disc     History   Social History  . Marital Status: Single    Spouse Name: N/A    Number of Children: 3  . Years of Education: N/A   Occupational History  . retired truck Chief Technology Officer term disability    Social History Main Topics  . Smoking status: Never Smoker   . Smokeless tobacco: Never Used  . Alcohol Use: 0.0 oz/week  . Drug Use: No  . Sexual Activity: Not on file   Other Topics Concern  . Not on file   Social History Narrative  . No narrative on file    Past Surgical History  Procedure Laterality Date  . Shoulder surgery      right  . Elbow surgery      right    Family History  Problem Relation Age of Onset  . Colon cancer Father   . Lung cancer Father   . CAD Father   . Diabetes Father   . Heart attack Father   . Colon cancer Brother   . Heart attack Paternal Grandfather   . CAD Brother     Allergies  Allergen Reactions  . Aspirin     REACTION: unspecified  . Glycerin  Nausea Only  . Penicillins     REACTION: family hx    Current Outpatient Prescriptions on File Prior to Visit  Medication Sig Dispense Refill  . albuterol (VENTOLIN HFA) 108 (90 BASE) MCG/ACT inhaler Inhale 2 puffs into the lungs every 4 (four) hours as needed.        Marland Kitchen azelastine (ASTELIN) 137 MCG/SPRAY nasal spray Place 2 sprays into both nostrils 2 (two) times daily. Use in each nostril as directed  30 mL  5  . fluticasone (FLONASE) 50 MCG/ACT nasal spray Place 2 sprays into both nostrils daily.  16 g  5  . HYDROcodone-acetaminophen (VICODIN ES) 7.5-750 MG per tablet Take 1 tablet by mouth every 6 (six) hours as needed. Patient used this medication for pain in his arm.      . Multiple Vitamin (MULTIVITAMIN) capsule Take 1 capsule by mouth daily.        . Naproxen Sodium (ALEVE) 220 MG CAPS Use once daily as needed  30 each  0  . Nebivolol HCl 20 MG TABS Take 1 tablet (20 mg total) by mouth daily.  90 tablet  1  . Olmesartan-Amlodipine-HCTZ (TRIBENZOR) 40-10-25 MG TABS Take 1 tablet by mouth daily.  90 tablet  1  .  pravastatin (PRAVACHOL) 40 MG tablet Take 1 tablet (40 mg total) by mouth daily.  90 tablet  1  . triamcinolone cream (KENALOG) 0.5 % Apply topically 2 (two) times daily.  30 g  1   No current facility-administered medications on file prior to visit.    BP 114/90  Temp(Src) 98.1 F (36.7 C) (Oral)  Ht 6' (1.829 m)  Wt 310 lb (140.615 kg)  BMI 42.03 kg/m2    Objective:   Physical Exam  Constitutional: He is oriented to person, place, and time. He appears well-developed and well-nourished. No distress.  Cardiovascular: Normal rate, regular rhythm and normal heart sounds.   No murmur heard. Pulmonary/Chest: Effort normal and breath sounds normal. He has no wheezes.  Abdominal: Soft. Bowel sounds are normal.  Musculoskeletal: He exhibits edema and tenderness.  Left lower leg swelling (pitting).  Left calf tenderness  Neurological: He is alert and oriented to person,  place, and time.  Skin: Skin is warm and dry. No erythema.  Psychiatric: He has a normal mood and affect. His behavior is normal.        Assessment & Plan:

## 2013-09-01 NOTE — Assessment & Plan Note (Addendum)
Patient reports chronic lateral left knee pain for 1 year.  His symptoms started after injury getting into golf cart.  He sensed "tearing sensation".  Refer to ortho for further evaluation.

## 2013-09-02 ENCOUNTER — Ambulatory Visit (HOSPITAL_COMMUNITY): Payer: Medicare PPO | Attending: Cardiology | Admitting: Cardiology

## 2013-09-02 DIAGNOSIS — M79605 Pain in left leg: Secondary | ICD-10-CM

## 2013-09-02 DIAGNOSIS — E785 Hyperlipidemia, unspecified: Secondary | ICD-10-CM | POA: Insufficient documentation

## 2013-09-02 DIAGNOSIS — M7989 Other specified soft tissue disorders: Secondary | ICD-10-CM | POA: Insufficient documentation

## 2013-09-02 DIAGNOSIS — I10 Essential (primary) hypertension: Secondary | ICD-10-CM | POA: Insufficient documentation

## 2013-09-02 DIAGNOSIS — M79609 Pain in unspecified limb: Secondary | ICD-10-CM | POA: Insufficient documentation

## 2013-09-02 NOTE — Progress Notes (Signed)
Lower venous duplex unilateral performed

## 2013-09-03 ENCOUNTER — Ambulatory Visit: Payer: Medicare PPO | Admitting: Internal Medicine

## 2013-09-03 ENCOUNTER — Telehealth: Payer: Self-pay | Admitting: *Deleted

## 2013-09-03 NOTE — Telephone Encounter (Signed)
Message copied by Pearletha Forge on Wed Sep 03, 2013  9:21 AM ------      Message from: Rosine Abe      Created: Tue Sep 02, 2013 12:30 PM      Regarding: FW: prelim       Call pt - Preliminary report is negative.            RY      ----- Message -----         From: Illene Silver         Sent: 09/02/2013  10:12 AM           To: Rosine Abe, DO      Subject: prelim                                                   Lower extremity venous duplex of the left leg appears negative for acute thrombus.       ------

## 2013-09-03 NOTE — Telephone Encounter (Signed)
Pt aware.

## 2014-02-25 ENCOUNTER — Other Ambulatory Visit: Payer: Self-pay | Admitting: Internal Medicine

## 2014-03-20 ENCOUNTER — Other Ambulatory Visit: Payer: Self-pay | Admitting: Internal Medicine

## 2014-05-03 ENCOUNTER — Other Ambulatory Visit: Payer: Self-pay | Admitting: Internal Medicine

## 2014-07-27 ENCOUNTER — Other Ambulatory Visit: Payer: Self-pay | Admitting: Internal Medicine

## 2014-08-20 ENCOUNTER — Other Ambulatory Visit: Payer: Self-pay | Admitting: Internal Medicine

## 2014-08-27 ENCOUNTER — Ambulatory Visit (INDEPENDENT_AMBULATORY_CARE_PROVIDER_SITE_OTHER): Payer: Commercial Managed Care - HMO | Admitting: Internal Medicine

## 2014-08-27 ENCOUNTER — Encounter: Payer: Self-pay | Admitting: Internal Medicine

## 2014-08-27 VITALS — BP 148/90 | HR 71 | Temp 98.5°F | Resp 20 | Ht 72.0 in | Wt 322.0 lb

## 2014-08-27 DIAGNOSIS — I10 Essential (primary) hypertension: Secondary | ICD-10-CM

## 2014-08-27 DIAGNOSIS — G4733 Obstructive sleep apnea (adult) (pediatric): Secondary | ICD-10-CM

## 2014-08-27 DIAGNOSIS — E785 Hyperlipidemia, unspecified: Secondary | ICD-10-CM

## 2014-08-27 MED ORDER — NEBIVOLOL HCL 10 MG PO TABS: 20.0000 mg | ORAL_TABLET | Freq: Every day | ORAL | Status: DC

## 2014-08-27 MED ORDER — PRAVASTATIN SODIUM 40 MG PO TABS: 40.0000 mg | ORAL_TABLET | Freq: Every day | ORAL | Status: DC

## 2014-08-27 NOTE — Patient Instructions (Signed)
Limit your sodium (Salt) intake  You need to lose weight.  Consider a lower calorie diet and regular exercise.  Please check your blood pressure on a regular basis.  If it is consistently greater than 150/90, please make an office appointment.  Return in 6 months for follow-up   

## 2014-08-27 NOTE — Progress Notes (Signed)
Pre visit review using our clinic review tool, if applicable. No additional management support is needed unless otherwise documented below in the visit note. 

## 2014-08-27 NOTE — Progress Notes (Signed)
Subjective:    Patient ID: Tommy Jackson, male    DOB: 09/14/1955, 59 y.o.   MRN: [id number]  HPI 59 year old patient who is seen today for follow-up.  Medical problems include multi-drug-resistant hypertension.  He has been quite well controlled on his present regimen.  He has a history of exogenous obesity and OSA requiring CPAP.  He has allergic rhinitis.  Doing quite well today without major concerns.  Office visit was prompted by need for medication refills  Past Medical History  Diagnosis Date  . HYPERTENSION 11/29/2005  . HYPERLIPIDEMIA 02/07/2007  . OBESITY 10/01/2007  . SLEEP APNEA, OBSTRUCTIVE 10/01/2007  . ASTHMA 09/07/2009  . Herniated disc     History   Social History  . Marital Status: Single    Spouse Name: N/A  . Number of Children: 3  . Years of Education: N/A   Occupational History  . retired truck Chief Technology Officer term disability    Social History Main Topics  . Smoking status: Never Smoker   . Smokeless tobacco: Never Used  . Alcohol Use: 0.0 oz/week  . Drug Use: No  . Sexual Activity: Not on file   Other Topics Concern  . Not on file   Social History Narrative    Past Surgical History  Procedure Laterality Date  . Shoulder surgery      right  . Elbow surgery      right    Family History  Problem Relation Age of Onset  . Colon cancer Father   . Lung cancer Father   . CAD Father   . Diabetes Father   . Heart attack Father   . Colon cancer Brother   . Heart attack Paternal Grandfather   . CAD Brother     Allergies  Allergen Reactions  . Aspirin     REACTION: unspecified  . Glycerin Nausea Only  . Penicillins     REACTION: family hx    Current Outpatient Prescriptions on File Prior to Visit  Medication Sig Dispense Refill  . albuterol (VENTOLIN HFA) 108 (90 BASE) MCG/ACT inhaler Inhale 2 puffs into the lungs every 4 (four) hours as needed.      Marland Kitchen azelastine (ASTELIN) 137 MCG/SPRAY nasal spray Place 2 sprays into both nostrils 2 (two)  times daily. Use in each nostril as directed 30 mL 5  . HYDROcodone-acetaminophen (VICODIN ES) 7.5-750 MG per tablet Take 1 tablet by mouth every 6 (six) hours as needed. Patient used this medication for pain in his arm.    . Multiple Vitamin (MULTIVITAMIN) capsule Take 1 capsule by mouth daily.      . Naproxen Sodium (ALEVE) 220 MG CAPS Use once daily as needed 30 each 0  . triamcinolone cream (KENALOG) 0.5 % Apply topically 2 (two) times daily. 30 g 1  . TRIBENZOR 40-10-25 MG TABS take 1 tablet by mouth once daily 90 tablet 0  . fluticasone (FLONASE) 50 MCG/ACT nasal spray Place 2 sprays into both nostrils daily. 16 g 5   No current facility-administered medications on file prior to visit.    BP 148/90 mmHg  Pulse 71  Temp(Src) 98.5 F (36.9 C) (Oral)  Resp 20  Ht 6' (1.829 m)  Wt 322 lb (146.058 kg)  BMI 43.66 kg/m2  SpO2 96%    Review of Systems  Constitutional: Negative for fever, chills, appetite change and fatigue.  HENT: Negative for congestion, dental problem, ear pain, hearing loss, sore throat, tinnitus, trouble swallowing and voice change.   Eyes: Negative  for pain, discharge and visual disturbance.  Respiratory: Negative for cough, chest tightness, wheezing and stridor.   Cardiovascular: Negative for chest pain, palpitations and leg swelling.  Gastrointestinal: Negative for nausea, vomiting, abdominal pain, diarrhea, constipation, blood in stool and abdominal distention.  Genitourinary: Negative for urgency, hematuria, flank pain, discharge, difficulty urinating and genital sores.  Musculoskeletal: Negative for myalgias, back pain, joint swelling, arthralgias, gait problem and neck stiffness.  Skin: Negative for rash.  Neurological: Negative for dizziness, syncope, speech difficulty, weakness, numbness and headaches.  Hematological: Negative for adenopathy. Does not bruise/bleed easily.  Psychiatric/Behavioral: Negative for behavioral problems and dysphoric mood. The  patient is not nervous/anxious.        Objective:   Physical Exam  Constitutional: He is oriented to person, place, and time. He appears well-developed.  Morbid obesity Blood pressure 140/84  HENT:  Head: Normocephalic.  Right Ear: External ear normal.  Left Ear: External ear normal.  Eyes: Conjunctivae and EOM are normal.  Neck: Normal range of motion.  Cardiovascular: Normal rate and normal heart sounds.   Pulmonary/Chest: Breath sounds normal.  Abdominal: Bowel sounds are normal.  Musculoskeletal: Normal range of motion. He exhibits no edema or tenderness.  Neurological: He is alert and oriented to person, place, and time.  Psychiatric: He has a normal mood and affect. His behavior is normal.          Assessment & Plan:   Hypertension, fair control Morbid obesity OSA Allergic rhinitis, stable Osteoarthritis  Schedule CPX Medications refilled

## 2014-09-01 ENCOUNTER — Other Ambulatory Visit: Payer: Self-pay

## 2014-10-16 ENCOUNTER — Telehealth: Payer: Self-pay | Admitting: Internal Medicine

## 2014-10-16 MED ORDER — VALSARTAN-HYDROCHLOROTHIAZIDE 320-25 MG PO TABS: 1.0000 | ORAL_TABLET | Freq: Every day | ORAL | Status: DC

## 2014-10-16 MED ORDER — AMLODIPINE BESYLATE 10 MG PO TABS: 10.0000 mg | ORAL_TABLET | Freq: Every day | ORAL | Status: DC

## 2014-10-16 MED ORDER — METOPROLOL TARTRATE 100 MG PO TABS: 100.0000 mg | ORAL_TABLET | Freq: Two times a day (BID) | ORAL | Status: DC

## 2014-10-16 NOTE — Telephone Encounter (Signed)
Pt states his nebivolol (BYSTOLIC) 10 MG tablet has gone to over $100 and the pharm  (pt takes 2 tabs /day) States he can switch to metoprolol Pt would like to try, but would need you to call in new rx w/ the equivalent to current dosage  Pt would like to switch the TRIBENZOR 40-10-25 MG TABS to l osartan w/ amlodipine Due to the cost going up w/ that med as well. pls advise  Pt saw dr Raliegh Ip on 6/30 who advised him to fu w/ dr you for cpx in 6 months  [facility] [address] [place]

## 2014-10-16 NOTE — Telephone Encounter (Signed)
If patient taking bystolic 20 mg once daily, he can try switching to metoprolol tartrate 100 mg one po bid.  #60 RF x 1.   We can also switch tribenzor to Diovan Hctz 320/25 one tab po qd and amlodipine 10 mg one po qd - both #30 RF x 1.  Please confirm pt not cutting bystolic or Tribenzor tabs.  He will need appt within 2 wks after changing medication.  He should monitor BP at home and call office if significant change in BP after changing to new medications.

## 2014-10-16 NOTE — Telephone Encounter (Signed)
Patient is aware, rx sent, appointment made  

## 2014-10-30 ENCOUNTER — Telehealth: Payer: Self-pay | Admitting: Internal Medicine

## 2014-10-30 ENCOUNTER — Ambulatory Visit: Payer: Commercial Managed Care - HMO | Admitting: Internal Medicine

## 2014-10-30 NOTE — Telephone Encounter (Signed)
Pt was to come in today for fu on bp.  Pt has taken his bp for the past 2 wks. Would like to know if you would like him to see someone else in the office for his bp fu,or  Advise pt what to do.                   BP                       pulse  8/20      146/88                     69 (Noon )                     141/83          71 p      9:30  Pm  8/21          154/93                  66  p  9:30 am                 138/89           73 p      11 pm  8/22           157/87                66  p      "                         157/87          66 p         8pm  8/23         147/ 89                64  p       "                         133/84           65 p       11pm   8/24          166/91                60   9am     (only once) 8/25  Skip  (traveling) 8/26                                              7pm        154/88         70  p         10:30pm      140/83        65 p 8/27  155/95               63 p    7am                   129/84       70 p      11pm 8/28    149/83           62  p    9:30 am  146/85        69p  11pm 8/29      145/88           63 p    8/30        Only once           141/86          94 p    10:30 pm 8/31     163/90          65 p                         148/92        64        9:30pm 9/1     146/86           65 p           151/86         73   10pm  Not taken this am. Pt has had stress due to home repairs going on. Will wait to hear from you.

## 2014-10-30 NOTE — Telephone Encounter (Signed)
Pt has been scheduled.  °

## 2014-10-30 NOTE — Telephone Encounter (Signed)
Yes, I suggest he see someone within next 1-2 wks for BP follow up

## 2014-11-11 ENCOUNTER — Ambulatory Visit (INDEPENDENT_AMBULATORY_CARE_PROVIDER_SITE_OTHER): Payer: Commercial Managed Care - HMO | Admitting: Adult Health

## 2014-11-11 ENCOUNTER — Encounter: Payer: Self-pay | Admitting: Adult Health

## 2014-11-11 VITALS — BP 110/80 | Temp 97.8°F | Ht 72.0 in | Wt 313.8 lb

## 2014-11-11 DIAGNOSIS — Z23 Encounter for immunization: Secondary | ICD-10-CM

## 2014-11-11 DIAGNOSIS — I10 Essential (primary) hypertension: Secondary | ICD-10-CM | POA: Diagnosis not present

## 2014-11-11 NOTE — Progress Notes (Signed)
Pre visit review using our clinic review tool, if applicable. No additional management support is needed unless otherwise documented below in the visit note. 

## 2014-11-11 NOTE — Progress Notes (Signed)
Subjective:    Patient ID: Tommy Jackson, male    DOB: 03-25-1955, 58 y.o.   MRN: [id number]  HPI  59 year old male, patient of Dr. Shawna Orleans, who presents to the office today for follow up regarding his BP. He has a history of multi-drug resistant hypertension. He currently takes Norvasc 10mg , Lopressor 100mg , Diovan 320-25.   He did not bring his log to this visit but endorses that the lowest he has seen his BP at home was 458 systolic and highest being 592 systolic. For the majority of the time it has been in the 924'M - 628'M systolic.    Today in the office his BP was 132/80. He denies any adverse reactions since starting his new medication regimen. Denies any headaches, dizziness, or blurred vision.   Review of Systems  Constitutional: Negative.           Respiratory: Negative.   Cardiovascular: Negative.   Neurological: Negative.   All other systems reviewed and are negative.      Objective:   Physical Exam  Constitutional: He is oriented to person, place, and time. He appears well-developed and well-nourished. No distress.  Obese   Eyes: Right eye exhibits no discharge. Left eye exhibits no discharge.  Cardiovascular: Normal rate, regular rhythm, normal heart sounds and intact distal pulses.  Exam reveals no gallop and no friction rub.   No murmur heard. Pulmonary/Chest: Effort normal and breath sounds normal. No respiratory distress. He has no wheezes. He has no rales. He exhibits no tenderness.  Neurological: He is alert and oriented to person, place, and time.  Skin: Skin is warm and dry. No rash noted. He is not diaphoretic. No erythema. No pallor.  Psychiatric: He has a normal mood and affect. His behavior is normal. Judgment and thought content normal.  Nursing note and vitals reviewed.  Past Medical History  Diagnosis Date  . HYPERTENSION 11/29/2005  . HYPERLIPIDEMIA 02/07/2007  . OBESITY 10/01/2007  . SLEEP APNEA, OBSTRUCTIVE 10/01/2007  . ASTHMA 09/07/2009  .  Herniated disc     Social History   Social History  . Marital Status: Single    Spouse Name: N/A  . Number of Children: 3  . Years of Education: N/A   Occupational History  . retired truck Chief Technology Officer term disability    Social History Main Topics  . Smoking status: Never Smoker   . Smokeless tobacco: Never Used  . Alcohol Use: 0.0 oz/week  . Drug Use: No  . Sexual Activity: Not on file   Other Topics Concern  . Not on file   Social History Narrative    Past Surgical History  Procedure Laterality Date  . Shoulder surgery      right  . Elbow surgery      right    Family History  Problem Relation Age of Onset  . Colon cancer Father   . Lung cancer Father   . CAD Father   . Diabetes Father   . Heart attack Father   . Colon cancer Brother   . Heart attack Paternal Grandfather   . CAD Brother     Allergies  Allergen Reactions  . Aspirin     REACTION: unspecified  . Glycerin Nausea Only  . Penicillins     REACTION: family hx    Current Outpatient Prescriptions on File Prior to Visit  Medication Sig Dispense Refill  . albuterol (VENTOLIN HFA) 108 (90 BASE) MCG/ACT inhaler Inhale 2 puffs into the lungs every  4 (four) hours as needed.      Marland Kitchen amLODipine (NORVASC) 10 MG tablet Take 1 tablet (10 mg total) by mouth daily. 30 tablet 1  . azelastine (ASTELIN) 137 MCG/SPRAY nasal spray Place 2 sprays into both nostrils 2 (two) times daily. Use in each nostril as directed 30 mL 5  . HYDROcodone-acetaminophen (VICODIN ES) 7.5-750 MG per tablet Take 1 tablet by mouth every 6 (six) hours as needed. Patient used this medication for pain in his arm.    . metoprolol (LOPRESSOR) 100 MG tablet Take 1 tablet (100 mg total) by mouth 2 (two) times daily. 60 tablet 1  . Multiple Vitamin (MULTIVITAMIN) capsule Take 1 capsule by mouth daily.      . Naproxen Sodium (ALEVE) 220 MG CAPS Use once daily as needed 30 each 0  . pravastatin (PRAVACHOL) 40 MG tablet Take 1 tablet (40 mg  total) by mouth daily. 90 tablet 1  . triamcinolone cream (KENALOG) 0.5 % Apply topically 2 (two) times daily. 30 g 1  . valsartan-hydrochlorothiazide (DIOVAN-HCT) 320-25 MG per tablet Take 1 tablet by mouth daily. 30 tablet 1  . fluticasone (FLONASE) 50 MCG/ACT nasal spray Place 2 sprays into both nostrils daily. 16 g 5   No current facility-administered medications on file prior to visit.    BP 110/80 mmHg  Temp(Src) 97.8 F (36.6 C) (Oral)  Ht 6' (1.829 m)  Wt 313 lb 12.8 oz (142.339 kg)  BMI 42.55 kg/m2       Assessment & Plan:  1. Essential hypertension - Becoming better controlled.  - Continue to monitor at home. Follow up if consistently above 202 systolic.  - Stressed importance of losing weight, diet and exercise 2. Encounter for immunization - Flu vaccination given

## 2014-11-11 NOTE — Patient Instructions (Signed)
It was great seeing you again!  Continue with the new blood pressure medications and keep monitoring your blood pressure. Let us know if it is consistently 150 and above.   If you need anything, please let me know.   Enjoy the beach!

## 2014-12-01 ENCOUNTER — Telehealth: Payer: Self-pay | Admitting: Internal Medicine

## 2014-12-01 MED ORDER — PRAVASTATIN SODIUM 40 MG PO TABS: 40.0000 mg | ORAL_TABLET | Freq: Every day | ORAL | Status: DC

## 2014-12-01 NOTE — Telephone Encounter (Signed)
Pt needs a refill on pravastatin  40 mg #90 sent to local pharm [facility] [address] in [place]

## 2014-12-01 NOTE — Telephone Encounter (Signed)
Rx sent 

## 2014-12-09 DIAGNOSIS — G4733 Obstructive sleep apnea (adult) (pediatric): Secondary | ICD-10-CM | POA: Diagnosis not present

## 2014-12-13 ENCOUNTER — Other Ambulatory Visit: Payer: Self-pay | Admitting: Family Medicine

## 2014-12-16 ENCOUNTER — Other Ambulatory Visit: Payer: Self-pay

## 2015-02-01 ENCOUNTER — Other Ambulatory Visit: Payer: Commercial Managed Care - HMO

## 2015-02-08 ENCOUNTER — Encounter: Payer: Commercial Managed Care - HMO | Admitting: Internal Medicine

## 2015-02-10 ENCOUNTER — Encounter: Payer: Commercial Managed Care - HMO | Admitting: Internal Medicine

## 2015-02-11 ENCOUNTER — Other Ambulatory Visit (INDEPENDENT_AMBULATORY_CARE_PROVIDER_SITE_OTHER): Payer: Commercial Managed Care - HMO

## 2015-02-11 DIAGNOSIS — Z0000 Encounter for general adult medical examination without abnormal findings: Secondary | ICD-10-CM

## 2015-02-11 LAB — POCT URINALYSIS DIPSTICK
Bilirubin, UA: NEGATIVE
Blood, UA: NEGATIVE
Glucose, UA: NEGATIVE
KETONES UA: NEGATIVE
Leukocytes, UA: NEGATIVE
Nitrite, UA: NEGATIVE
PH UA: 6
Protein, UA: NEGATIVE
Spec Grav, UA: 1.025
Urobilinogen, UA: 0.2

## 2015-02-11 LAB — HEPATIC FUNCTION PANEL
ALBUMIN: 4.7 g/dL (ref 3.5–5.2)
ALK PHOS: 48 U/L (ref 39–117)
ALT: 43 U/L (ref 0–53)
AST: 28 U/L (ref 0–37)
Bilirubin, Direct: 0.1 mg/dL (ref 0.0–0.3)
Total Bilirubin: 0.5 mg/dL (ref 0.2–1.2)
Total Protein: 7.4 g/dL (ref 6.0–8.3)

## 2015-02-11 LAB — CBC WITH DIFFERENTIAL/PLATELET
BASOS PCT: 0.5 % (ref 0.0–3.0)
Basophils Absolute: 0 10*3/uL (ref 0.0–0.1)
EOS PCT: 7 % — AB (ref 0.0–5.0)
Eosinophils Absolute: 0.5 10*3/uL (ref 0.0–0.7)
HEMATOCRIT: 46.6 % (ref 39.0–52.0)
Hemoglobin: 15.8 g/dL (ref 13.0–17.0)
LYMPHS PCT: 42.2 % (ref 12.0–46.0)
Lymphs Abs: 3.1 10*3/uL (ref 0.7–4.0)
MCHC: 34 g/dL (ref 30.0–36.0)
MCV: 93.7 fl (ref 78.0–100.0)
MONOS PCT: 9.2 % (ref 3.0–12.0)
Monocytes Absolute: 0.7 10*3/uL (ref 0.1–1.0)
NEUTROS ABS: 3 10*3/uL (ref 1.4–7.7)
NEUTROS PCT: 41.1 % — AB (ref 43.0–77.0)
PLATELETS: 196 10*3/uL (ref 150.0–400.0)
RBC: 4.98 Mil/uL (ref 4.22–5.81)
RDW: 13.2 % (ref 11.5–15.5)
WBC: 7.2 10*3/uL (ref 4.0–10.5)

## 2015-02-11 LAB — MICROALBUMIN / CREATININE URINE RATIO
Creatinine,U: 176.4 mg/dL
MICROALB UR: 2.3 mg/dL — AB (ref 0.0–1.9)
Microalb Creat Ratio: 1.3 mg/g (ref 0.0–30.0)

## 2015-02-11 LAB — LIPID PANEL
Cholesterol: 131 mg/dL (ref 0–200)
HDL: 37.5 mg/dL — ABNORMAL LOW (ref >39.00)
LDL Cholesterol: 55 mg/dL (ref 0–99)
NONHDL: 93.07
Total CHOL/HDL Ratio: 3
Triglycerides: 192 mg/dL — ABNORMAL HIGH (ref 0.0–149.0)
VLDL: 38.4 mg/dL (ref 0.0–40.0)

## 2015-02-11 LAB — BASIC METABOLIC PANEL
BUN: 18 mg/dL (ref 6–23)
CO2: 33 mEq/L — ABNORMAL HIGH (ref 19–32)
Calcium: 9.7 mg/dL (ref 8.4–10.5)
Chloride: 103 mEq/L (ref 96–112)
Creatinine, Ser: 0.95 mg/dL (ref 0.40–1.50)
GFR: 86.37 mL/min (ref >60.00)
GLUCOSE: 110 mg/dL — AB (ref 70–99)
Potassium: 4.2 mEq/L (ref 3.5–5.1)
SODIUM: 144 meq/L (ref 135–145)

## 2015-02-11 LAB — PSA: PSA: 0.58 ng/mL (ref 0.10–4.00)

## 2015-02-11 LAB — HEMOGLOBIN A1C: HEMOGLOBIN A1C: 6 % (ref 4.6–6.5)

## 2015-02-11 LAB — TSH: TSH: 1.65 u[IU]/mL (ref 0.35–4.50)

## 2015-02-18 ENCOUNTER — Encounter: Payer: Self-pay | Admitting: Family Medicine

## 2015-02-18 ENCOUNTER — Ambulatory Visit (INDEPENDENT_AMBULATORY_CARE_PROVIDER_SITE_OTHER): Payer: Commercial Managed Care - HMO | Admitting: Family Medicine

## 2015-02-18 VITALS — BP 122/88 | HR 72 | Temp 98.0°F | Ht 72.5 in | Wt 300.8 lb

## 2015-02-18 DIAGNOSIS — R7303 Prediabetes: Secondary | ICD-10-CM

## 2015-02-18 DIAGNOSIS — Z0000 Encounter for general adult medical examination without abnormal findings: Secondary | ICD-10-CM | POA: Diagnosis not present

## 2015-02-18 NOTE — Progress Notes (Signed)
Subjective:    Patient ID: Tommy Jackson, male    DOB: 05-05-1955, 59 y.o.   MRN: [id number]  HPI Patient seen for complete physical History of obesity, hypertension, dyslipidemia, mild intermittent asthma He has lost about 30 pounds due to his efforts in recent months. He hopes to continue weight loss efforts.  He had colonoscopy September 2011. Both his father and brother have had colon cancer. He is due for repeat at this time and is specifically requesting change to [facility].  Nonsmoker. Tetanus up-to-date. Flu vaccine already given.  Past Medical History  Diagnosis Date  . HYPERTENSION 11/29/2005  . HYPERLIPIDEMIA 02/07/2007  . OBESITY 10/01/2007  . SLEEP APNEA, OBSTRUCTIVE 10/01/2007  . ASTHMA 09/07/2009  . Herniated disc    Past Surgical History  Procedure Laterality Date  . Shoulder surgery      right  . Elbow surgery      right    reports that he has never smoked. He has never used smokeless tobacco. He reports that he drinks alcohol. He reports that he does not use illicit drugs. family history includes CAD in his brother and father; Colon cancer in his brother and father; Diabetes in his father; Heart attack in his father and paternal grandfather; Lung cancer in his father. Allergies  Allergen Reactions  . Aspirin     REACTION: unspecified  . Glycerin Nausea Only  . Penicillins     REACTION: family hx      Review of Systems  Constitutional: Negative for fever, activity change, appetite change and fatigue.  HENT: Negative for congestion, ear pain and trouble swallowing.   Eyes: Negative for pain and visual disturbance.  Respiratory: Negative for cough, shortness of breath and wheezing.   Cardiovascular: Negative for chest pain and palpitations.  Gastrointestinal: Negative for nausea, vomiting, abdominal pain, diarrhea, constipation, blood in stool, abdominal distention and rectal pain.  Genitourinary: Negative for dysuria, hematuria and testicular pain.    Musculoskeletal: Positive for back pain. Negative for joint swelling and arthralgias.  Skin: Negative for rash.  Neurological: Negative for dizziness, syncope and headaches.  Hematological: Negative for adenopathy.  Psychiatric/Behavioral: Negative for confusion and dysphoric mood.       Objective:   Physical Exam  Constitutional: He is oriented to person, place, and time. He appears well-developed and well-nourished. No distress.  HENT:  Head: Normocephalic and atraumatic.  Right Ear: External ear normal.  Left Ear: External ear normal.  Mouth/Throat: Oropharynx is clear and moist.  Eyes: Conjunctivae and EOM are normal. Pupils are equal, round, and reactive to light.  Neck: Normal range of motion. Neck supple. No thyromegaly present.  Cardiovascular: Normal rate, regular rhythm and normal heart sounds.   No murmur heard. Pulmonary/Chest: Effort normal. No respiratory distress. He has wheezes. He has no rales.  Few faint wheezes  Abdominal: Soft. Bowel sounds are normal. He exhibits no distension and no mass. There is no tenderness. There is no rebound and no guarding.  Musculoskeletal: He exhibits no edema.  Lymphadenopathy:    He has no cervical adenopathy.  Neurological: He is alert and oriented to person, place, and time. He displays normal reflexes. No cranial nerve deficit.  Skin: No rash noted.  Psychiatric: He has a normal mood and affect.          Assessment & Plan:  Physical exam. Labs reviewed. He has dyslipidemia. Mildly elevated glucose 110. We discussed implications of prediabetes and metabolic syndrome. Needs to lose additional weight. Reduce high glycemic  foods and white starches. Set up repeat colonoscopy. Strong family history of colon cancer father and brother

## 2015-02-18 NOTE — Patient Instructions (Signed)
We will set up repeat colonoscopy.   Reduce refined sugars and white starches. Continue to lose more weight Food Choices to Lower Your Triglycerides Triglycerides are a type of fat in your blood. High levels of triglycerides can increase the risk of heart disease and stroke. If your triglyceride levels are high, the foods you eat and your eating habits are very important. Choosing the right foods can help lower your triglycerides.  WHAT GENERAL GUIDELINES DO I NEED TO FOLLOW?  Lose weight if you are overweight.   Limit or avoid alcohol.   Fill one half of your plate with vegetables and green salads.   Limit fruit to two servings a day. Choose fruit instead of juice.   Make one fourth of your plate whole grains. Look for the word "whole" as the first word in the ingredient list.  Fill one fourth of your plate with lean protein foods.  Enjoy fatty fish (such as salmon, mackerel, sardines, and tuna) three times a week.   Choose healthy fats.   Limit foods high in starch and sugar.  Eat more home-cooked food and less restaurant, buffet, and fast food.  Limit fried foods.  Cook foods using methods other than frying.  Limit saturated fats.  Check ingredient lists to avoid foods with partially hydrogenated oils (trans fats) in them. WHAT FOODS CAN I EAT?  Grains Whole grains, such as whole wheat or whole grain breads, crackers, cereals, and pasta. Unsweetened oatmeal, bulgur, barley, quinoa, or brown rice. Corn or whole wheat flour tortillas.  Vegetables Fresh or frozen vegetables (raw, steamed, roasted, or grilled). Green salads. Fruits All fresh, canned (in natural juice), or frozen fruits. Meat and Other Protein Products Ground beef (85% or leaner), grass-fed beef, or beef trimmed of fat. Skinless chicken or [place]. Ground chicken or [place]. Pork trimmed of fat. All fish and seafood. Eggs. Dried beans, peas, or lentils. Unsalted nuts or seeds. Unsalted canned or dry  beans. Dairy Low-fat dairy products, such as skim or 1% milk, 2% or reduced-fat cheeses, low-fat ricotta or cottage cheese, or plain low-fat yogurt. Fats and Oils Tub margarines without trans fats. Light or reduced-fat mayonnaise and salad dressings. Avocado. Safflower, olive, or canola oils. Natural peanut or almond butter. The items listed above may not be a complete list of recommended foods or beverages. Contact your dietitian for more options. WHAT FOODS ARE NOT RECOMMENDED?  Grains White bread. White pasta. White rice. Cornbread. Bagels, pastries, and croissants. Crackers that contain trans fat. Vegetables White potatoes. Corn. Creamed or fried vegetables. Vegetables in a cheese sauce. Fruits Dried fruits. Canned fruit in light or heavy syrup. Fruit juice. Meat and Other Protein Products Fatty cuts of meat. Ribs, chicken wings, bacon, sausage, bologna, salami, chitterlings, fatback, hot dogs, bratwurst, and packaged luncheon meats. Dairy Whole or 2% milk, cream, half-and-half, and cream cheese. Whole-fat or sweetened yogurt. Full-fat cheeses. Nondairy creamers and whipped toppings. Processed cheese, cheese spreads, or cheese curds. Sweets and Desserts Corn syrup, sugars, honey, and molasses. Candy. Jam and jelly. Syrup. Sweetened cereals. Cookies, pies, cakes, donuts, muffins, and ice cream. Fats and Oils Butter, stick margarine, lard, shortening, ghee, or bacon fat. Coconut, palm kernel, or palm oils. Beverages Alcohol. Sweetened drinks (such as sodas, lemonade, and fruit drinks or punches). The items listed above may not be a complete list of foods and beverages to avoid. Contact your dietitian for more information.   This information is not intended to replace advice given to you by your health care  provider. Make sure you discuss any questions you have with your health care provider.   Document Released: 12/02/2003 Document Revised: 03/06/2014 Document Reviewed:  12/18/2012 Elsevier Interactive Patient Education [employer].

## 2015-03-09 ENCOUNTER — Telehealth: Payer: Self-pay | Admitting: Gastroenterology

## 2015-03-09 NOTE — Telephone Encounter (Signed)
Received [facility] records and placed on Dr. Doyne Keel desk for review. Dr. Havery Moros is Doc of the Day.

## 2015-03-15 ENCOUNTER — Encounter: Payer: Self-pay | Admitting: Gastroenterology

## 2015-03-15 NOTE — Telephone Encounter (Signed)
Dr. Armbruster reviewed records and has accepted patient. Ok to schedule Direct Colon. Colonoscopy scheduled. °

## 2015-03-24 ENCOUNTER — Telehealth: Payer: Self-pay | Admitting: Internal Medicine

## 2015-03-24 MED ORDER — OSELTAMIVIR PHOSPHATE 75 MG PO CAPS: 75.0000 mg | ORAL_CAPSULE | Freq: Every day | ORAL | Status: DC

## 2015-03-24 NOTE — Telephone Encounter (Signed)
Rx okay per Dr Shawna Orleans.  Tamiflu 75 mg daily for 10 days.  Rx sent and patient is aware.

## 2015-03-24 NOTE — Telephone Encounter (Signed)
Patient Name: DEAGON UYEHARA  DOB: [id number]    Initial Comment 1 of 2 Caller states grandchild was DX w/ flu - was exposed    Nurse Assessment  Nurse: Raphael Gibney, RN, Vanita Ingles Date/Time ([place] Time): 03/24/2015 9:57:59 AM  Confirm and document reason for call. If symptomatic, describe symptoms. You must click the next button to save text entered. ---Caller states he has been exposed to the influenza by his grandchild last night. Currently not having any symptoms. His grandchild was diagnosed today with influenza. Child's doctor said anyone exposed should get Tamiflu.  Has the patient traveled out of the country within the last 30 days? ---Not Applicable  Does the patient have any new or worsening symptoms? ---Yes  Will a triage be completed? ---Yes  Related visit to physician within the last 2 weeks? ---No  Does the PT have any chronic conditions? (i.e. diabetes, asthma, etc.) ---Yes  List chronic conditions. ---HTN  Is this a behavioral health or substance abuse call? ---No     Guidelines    Guideline Title Affirmed Question Affirmed Notes  Influenza Exposure [1] Influenza EXPOSURE within last 72 hours (3 days) AND [2] NOT HIGH RISK AND [3] strongly requests antiviral medication    Final Disposition User   Call PCP within 24 Hours Raphael Gibney, RN, Vanita Ingles    Comments  Pt would like prescription for Tamiflu called into [facility] on [address], [address]; phone: [phone]. Please call pt back and let him know if Tamiflu will be called in.   Referrals  REFERRED TO PCP OFFICE   Disagree/Comply: Comply

## 2015-04-22 ENCOUNTER — Encounter: Payer: Self-pay | Admitting: Adult Health

## 2015-04-22 ENCOUNTER — Ambulatory Visit (INDEPENDENT_AMBULATORY_CARE_PROVIDER_SITE_OTHER): Payer: Commercial Managed Care - HMO | Admitting: Adult Health

## 2015-04-22 ENCOUNTER — Telehealth: Payer: Self-pay | Admitting: Internal Medicine

## 2015-04-22 VITALS — BP 126/80 | HR 77 | Temp 98.6°F | Ht 72.5 in | Wt 299.8 lb

## 2015-04-22 DIAGNOSIS — J4521 Mild intermittent asthma with (acute) exacerbation: Secondary | ICD-10-CM | POA: Diagnosis not present

## 2015-04-22 MED ORDER — HYDROCODONE-HOMATROPINE 5-1.5 MG/5ML PO SYRP: 5.0000 mL | ORAL_SOLUTION | Freq: Three times a day (TID) | ORAL | Status: DC | PRN

## 2015-04-22 MED ORDER — PREDNISONE 10 MG PO TABS: 10.0000 mg | ORAL_TABLET | Freq: Every day | ORAL | Status: DC

## 2015-04-22 MED ORDER — ALBUTEROL SULFATE HFA 108 (90 BASE) MCG/ACT IN AERS: 2.0000 | INHALATION_SPRAY | RESPIRATORY_TRACT | Status: DC | PRN

## 2015-04-22 MED ORDER — IPRATROPIUM-ALBUTEROL 0.5-2.5 (3) MG/3ML IN SOLN
3.0000 mL | Freq: Once | RESPIRATORY_TRACT | Status: AC
  Administered 2015-04-22: 3 mL via RESPIRATORY_TRACT

## 2015-04-22 MED FILL — VENTOLIN HFA 90 MCG INHALER: 108 (90 BAS | 16 days supply | Qty: 18 | Fill #0

## 2015-04-22 MED FILL — HYDROCODONE-HOMATROPINE SYR: 5-1.5 | 8 days supply | Qty: 120 | Fill #0

## 2015-04-22 NOTE — Patient Instructions (Addendum)
As always, it was great seeing you.   Your exam is consistent with an asthma flare.   I have sent in a prescription for prednisone.   Take as directed  40 mg x 3 days  20 mg x 3 days 10 mg x 3 days.   During the day you can also take Mucinex cough  Asthma, Adult Asthma is a recurring condition in which the airways tighten and narrow. Asthma can make it difficult to breathe. It can cause coughing, wheezing, and shortness of breath. Asthma episodes, also called asthma attacks, range from minor to life-threatening. Asthma cannot be cured, but medicines and lifestyle changes can help control it. CAUSES Asthma is believed to be caused by inherited (genetic) and environmental factors, but its exact cause is unknown. Asthma may be triggered by allergens, lung infections, or irritants in the air. Asthma triggers are different for each person. Common triggers include:   Animal dander.  Dust mites.  Cockroaches.  Pollen from trees or grass.  Mold.  Smoke.  Air pollutants such as dust, household cleaners, hair sprays, aerosol sprays, paint fumes, strong chemicals, or strong odors.  Cold air, weather changes, and winds (which increase molds and pollens in the air).  Strong emotional expressions such as crying or laughing hard.  Stress.  Certain medicines (such as aspirin) or types of drugs (such as beta-blockers).  Sulfites in foods and drinks. Foods and drinks that may contain sulfites include dried fruit, potato chips, and sparkling grape juice.  Infections or inflammatory conditions such as the flu, a cold, or an inflammation of the nasal membranes (rhinitis).  Gastroesophageal reflux disease (GERD).  Exercise or strenuous activity. SYMPTOMS Symptoms may occur immediately after asthma is triggered or many hours later. Symptoms include:  Wheezing.  Excessive nighttime or early morning coughing.  Frequent or severe coughing with a common cold.  Chest  tightness.  Shortness of breath. DIAGNOSIS  The diagnosis of asthma is made by a review of your medical history and a physical exam. Tests may also be performed. These may include:  Lung function studies. These tests show how much air you breathe in and out.  Allergy tests.  Imaging tests such as X-rays. TREATMENT  Asthma cannot be cured, but it can usually be controlled. Treatment involves identifying and avoiding your asthma triggers. It also involves medicines. There are 2 classes of medicine used for asthma treatment:   Controller medicines. These prevent asthma symptoms from occurring. They are usually taken every day.  Reliever or rescue medicines. These quickly relieve asthma symptoms. They are used as needed and provide short-term relief. Your health care provider will help you create an asthma action plan. An asthma action plan is a written plan for managing and treating your asthma attacks. It includes a list of your asthma triggers and how they may be avoided. It also includes information on when medicines should be taken and when their dosage should be changed. An action plan may also involve the use of a device called a peak flow meter. A peak flow meter measures how well the lungs are working. It helps you monitor your condition. HOME CARE INSTRUCTIONS   Take medicines only as directed by your health care provider. Speak with your health care provider if you have questions about how or when to take the medicines.  Use a peak flow meter as directed by your health care provider. Record and keep track of readings.  Understand and use the action plan to help minimize  or stop an asthma attack without needing to seek medical care.  Control your home environment in the following ways to help prevent asthma attacks:  Do not smoke. Avoid being exposed to secondhand smoke.  Change your heating and air conditioning filter regularly.  Limit your use of fireplaces and wood  stoves.  Get rid of pests (such as roaches and mice) and their droppings.  Throw away plants if you see mold on them.  Clean your floors and dust regularly. Use unscented cleaning products.  Try to have someone else vacuum for you regularly. Stay out of rooms while they are being vacuumed and for a short while afterward. If you vacuum, use a dust mask from a hardware store, a double-layered or microfilter vacuum cleaner bag, or a vacuum cleaner with a HEPA filter.  Replace carpet with wood, tile, or vinyl flooring. Carpet can trap dander and dust.  Use allergy-proof pillows, mattress covers, and box spring covers.  Wash bed sheets and blankets every week in hot water and dry them in a dryer.  Use blankets that are made of polyester or cotton.  Clean bathrooms and kitchens with bleach. If possible, have someone repaint the walls in these rooms with mold-resistant paint. Keep out of the rooms that are being cleaned and painted.  Wash hands frequently. SEEK MEDICAL CARE IF:   You have wheezing, shortness of breath, or a cough even if taking medicine to prevent attacks.  The colored mucus you cough up (sputum) is thicker than usual.  Your sputum changes from clear or white to yellow, green, gray, or bloody.  You have any problems that may be related to the medicines you are taking (such as a rash, itching, swelling, or trouble breathing).  You are using a reliever medicine more than 2-3 times per week.  Your peak flow is still at 50-79% of your personal best after following your action plan for 1 hour.  You have a fever. SEEK IMMEDIATE MEDICAL CARE IF:   You seem to be getting worse and are unresponsive to treatment during an asthma attack.  You are short of breath even at rest.  You get short of breath when doing very little physical activity.  You have difficulty eating, drinking, or talking due to asthma symptoms.  You develop chest pain.  You develop a fast  heartbeat.  You have a bluish color to your lips or fingernails.  You are light-headed, dizzy, or faint.  Your peak flow is less than 50% of your personal best.   This information is not intended to replace advice given to you by your health care provider. Make sure you discuss any questions you have with your health care provider.   Document Released: 02/13/2005 Document Revised: 11/04/2014 Document Reviewed: 09/12/2012 Elsevier Interactive Patient Education [employer].

## 2015-04-22 NOTE — Progress Notes (Signed)
Pre visit review using our clinic review tool, if applicable. No additional management support is needed unless otherwise documented below in the visit note. 

## 2015-04-22 NOTE — Telephone Encounter (Signed)
Aaron Edelman from [facility] has question about patient medication prednisone.  [phone]

## 2015-04-22 NOTE — Telephone Encounter (Signed)
Pharmacist is aware that the Prednisone should be a dose burst pack.

## 2015-04-22 NOTE — Progress Notes (Signed)
Subjective:    Patient ID: Tommy Jackson, male    DOB: 1955-03-31, 60 y.o.   MRN: [id number]  HPI  60 year old male who presents to the office today for cough, wheezing, SOB, and PND. He denies any fevers. His cough is semi productive.   He reports that he has quit smoking about 2 months ago.   Is taking Zyrtec daily for allergies  Review of Systems  Constitutional: Negative.   HENT: Positive for postnasal drip and rhinorrhea. Negative for sinus pressure and sore throat.   Respiratory: Positive for cough, shortness of breath and wheezing.   Cardiovascular: Negative.   Allergic/Immunologic: Negative.   All other systems reviewed and are negative.  Past Medical History  Diagnosis Date  . HYPERTENSION 11/29/2005  . HYPERLIPIDEMIA 02/07/2007  . OBESITY 10/01/2007  . SLEEP APNEA, OBSTRUCTIVE 10/01/2007  . ASTHMA 09/07/2009  . Herniated disc     Social History   Social History  . Marital Status: Single    Spouse Name: N/A  . Number of Children: 3  . Years of Education: N/A   Occupational History  . retired truck Chief Technology Officer term disability    Social History Main Topics  . Smoking status: Never Smoker   . Smokeless tobacco: Never Used  . Alcohol Use: 0.0 oz/week  . Drug Use: No  . Sexual Activity: Not on file   Other Topics Concern  . Not on file   Social History Narrative    Past Surgical History  Procedure Laterality Date  . Shoulder surgery      right  . Elbow surgery      right    Family History  Problem Relation Age of Onset  . Colon cancer Father   . Lung cancer Father   . CAD Father   . Diabetes Father   . Heart attack Father   . Colon cancer Brother   . Heart attack Paternal Grandfather   . CAD Brother     Allergies  Allergen Reactions  . Aspirin     REACTION: unspecified  . Glycerin Nausea Only  . Penicillins     REACTION: family hx    Current Outpatient Prescriptions on File Prior to Visit  Medication Sig Dispense Refill  .  amLODipine (NORVASC) 10 MG tablet take 1 tablet by mouth once daily 30 tablet 5  . azelastine (ASTELIN) 137 MCG/SPRAY nasal spray Place 2 sprays into both nostrils 2 (two) times daily. Use in each nostril as directed 30 mL 5  . fluticasone (FLONASE) 50 MCG/ACT nasal spray Place 2 sprays into both nostrils daily. 16 g 5  . HYDROcodone-acetaminophen (VICODIN ES) 7.5-750 MG per tablet Take 1 tablet by mouth every 6 (six) hours as needed. Patient used this medication for pain in his arm.    . Lido-Capsaicin-Men-Methyl Sal (MEDI-PATCH-LIDOCAINE EX) Apply topically.    . metoprolol (LOPRESSOR) 100 MG tablet take 1 tablet by mouth twice a day 60 tablet 5  . Multiple Vitamin (MULTIVITAMIN) capsule Take 1 capsule by mouth daily.      . pravastatin (PRAVACHOL) 40 MG tablet Take 1 tablet (40 mg total) by mouth daily. 90 tablet 1  . valsartan-hydrochlorothiazide (DIOVAN-HCT) 320-25 MG tablet take 1 tablet by mouth once daily 30 tablet 5   No current facility-administered medications on file prior to visit.    BP 126/80 mmHg  Pulse 77  Temp(Src) 98.6 F (37 C) (Oral)  Ht 6' 0.5" (1.842 m)  Wt 299 lb 12.8 oz (135.988 kg)  BMI 40.08 kg/m2  SpO2 94%        Objective:   Physical Exam  Constitutional: He is oriented to person, place, and time. He appears well-developed and well-nourished. No distress.  Neck: Normal range of motion. Neck supple. No thyromegaly present.  Cardiovascular: Normal rate, regular rhythm, normal heart sounds and intact distal pulses.  Exam reveals no gallop.   No murmur heard. Pulmonary/Chest: Effort normal. No respiratory distress. He has no decreased breath sounds. He has wheezes in the right upper field, the right middle field, the right lower field, the left upper field, the left middle field and the left lower field. He has no rales. He exhibits no tenderness.  Neurological: He is alert and oriented to person, place, and time.  Skin: Skin is warm and dry. No rash noted.  He is not diaphoretic. No erythema. No pallor.  Psychiatric: He has a normal mood and affect. His behavior is normal. Judgment and thought content normal.  Nursing note and vitals reviewed.     Assessment & Plan:  1. Asthma with exacerbation, mild intermittent - Pulse ox 94% prior to neb, he was wheezing in full fields. After nebuliuzer, oxygen saturation up to 96%. He is moving air more easily. Wheezing has diminished significantly. He reports being able to breath easier.  - ipratropium-albuterol (DUONEB) 0.5-2.5 (3) MG/3ML nebulizer solution 3 mL; Take 3 mLs by nebulization once. - predniSONE (DELTASONE) 10 MG tablet; Take 1 tablet (10 mg total) by mouth daily with breakfast.  Dispense: 21 tablet; Refill: 0 - albuterol (VENTOLIN HFA) 108 (90 Base) MCG/ACT inhaler; Inhale 2 puffs into the lungs every 4 (four) hours as needed.  Dispense: 1 Inhaler; Refill: 11 - HYDROcodone-homatropine (HYCODAN) 5-1.5 MG/5ML syrup; Take 5 mLs by mouth every 8 (eight) hours as needed for cough.  Dispense: 120 mL; Refill: 0 - Follow up as needed

## 2015-04-29 ENCOUNTER — Ambulatory Visit (AMBULATORY_SURGERY_CENTER): Payer: Self-pay | Admitting: *Deleted

## 2015-04-29 VITALS — Ht 72.0 in | Wt 299.0 lb

## 2015-04-29 DIAGNOSIS — Z800 Family history of malignant neoplasm of digestive organs: Secondary | ICD-10-CM

## 2015-04-29 MED ORDER — NA SULFATE-K SULFATE-MG SULF 17.5-3.13-1.6 GM/177ML PO SOLN: ORAL | Status: DC

## 2015-04-29 NOTE — Progress Notes (Signed)
No allergies to eggs or soy. No problems with anesthesia.  Pt given Emmi instructions for colonoscopy  No oxygen use  No diet drug use  

## 2015-05-03 ENCOUNTER — Telehealth: Payer: Self-pay | Admitting: Gastroenterology

## 2015-05-03 NOTE — Telephone Encounter (Signed)
[facility]- marie PV

## 2015-05-03 NOTE — Telephone Encounter (Signed)
Pt states his prep is over 100$. Wants different prep Gave sample of suprep Medication Samples have been provided to the patient.  Drug name: suprep       Strength:         Qty: 1  [id number]:[id number]  Exp.Date: -1-19  The patient has been instructed regarding the correct time, dose, and frequency of taking this medication, including desired effects and most common side effects.   Tommy Jackson 11:53 AM 05/03/2015

## 2015-05-13 ENCOUNTER — Encounter: Payer: Self-pay | Admitting: Gastroenterology

## 2015-05-13 ENCOUNTER — Ambulatory Visit (AMBULATORY_SURGERY_CENTER): Payer: Commercial Managed Care - HMO | Admitting: Gastroenterology

## 2015-05-13 VITALS — BP 117/74 | HR 60 | Temp 97.8°F | Resp 16 | Ht 72.0 in | Wt 299.0 lb

## 2015-05-13 DIAGNOSIS — K621 Rectal polyp: Secondary | ICD-10-CM

## 2015-05-13 DIAGNOSIS — D122 Benign neoplasm of ascending colon: Secondary | ICD-10-CM | POA: Diagnosis not present

## 2015-05-13 DIAGNOSIS — D127 Benign neoplasm of rectosigmoid junction: Secondary | ICD-10-CM

## 2015-05-13 DIAGNOSIS — D123 Benign neoplasm of transverse colon: Secondary | ICD-10-CM | POA: Diagnosis not present

## 2015-05-13 DIAGNOSIS — D129 Benign neoplasm of anus and anal canal: Secondary | ICD-10-CM

## 2015-05-13 DIAGNOSIS — D120 Benign neoplasm of cecum: Secondary | ICD-10-CM

## 2015-05-13 DIAGNOSIS — D128 Benign neoplasm of rectum: Secondary | ICD-10-CM

## 2015-05-13 DIAGNOSIS — Z800 Family history of malignant neoplasm of digestive organs: Secondary | ICD-10-CM

## 2015-05-13 DIAGNOSIS — K635 Polyp of colon: Secondary | ICD-10-CM

## 2015-05-13 DIAGNOSIS — G4730 Sleep apnea, unspecified: Secondary | ICD-10-CM | POA: Diagnosis not present

## 2015-05-13 DIAGNOSIS — J45909 Unspecified asthma, uncomplicated: Secondary | ICD-10-CM | POA: Diagnosis not present

## 2015-05-13 DIAGNOSIS — Z86010 Personal history of colonic polyps: Secondary | ICD-10-CM | POA: Diagnosis not present

## 2015-05-13 DIAGNOSIS — I10 Essential (primary) hypertension: Secondary | ICD-10-CM | POA: Diagnosis not present

## 2015-05-13 MED ORDER — SODIUM CHLORIDE 0.9 % IV SOLN: 500.0000 mL | INTRAVENOUS | Status: DC

## 2015-05-13 NOTE — Progress Notes (Signed)
Called to room to assist during endoscopic procedure.  Patient ID and intended procedure confirmed with present staff. Received instructions for my participation in the procedure from the performing physician.  

## 2015-05-13 NOTE — Op Note (Signed)
[place] Patient Name: Tommy Jackson Procedure Date: 05/13/2015 9:52 AM MRN: [id number] Endoscopist: Remo Lipps P. Havery Moros , MD Age: 60 Referring MD:  Date of Birth: [birth year] Gender: Male Procedure:                Colonoscopy Indications:              Screening in patient at increased risk: Family                            history of 1st-degree relative with colorectal                            cancer before age 54 years, High risk colon cancer                            surveillance: Personal history of colonic polyps Medicines:                Monitored Anesthesia Care Procedure:                Pre-Anesthesia Assessment:                           - Prior to the procedure, a History and Physical                            was performed, and patient medications and                            allergies were reviewed. The patient's tolerance of                            previous anesthesia was also reviewed. The risks                            and benefits of the procedure and the sedation                            options and risks were discussed with the patient.                            All questions were answered, and informed consent                            was obtained. Prior Anticoagulants: The patient has                            taken no previous anticoagulant or antiplatelet                            agents. ASA Grade Assessment: III - A patient with                            severe systemic disease. After reviewing the risks  and benefits, the patient was deemed in                            satisfactory condition to undergo the procedure.                           After obtaining informed consent, the colonoscope                            was passed under direct vision. Throughout the                            procedure, the patient's blood pressure, pulse, and                            oxygen saturations were  monitored continuously. The                            Model CF-HQ190L [id number]) scope was introduced                            through the anus and advanced to the the cecum,                            identified by appendiceal orifice and ileocecal                            valve. The colonoscopy was performed without                            difficulty. The patient tolerated the procedure                            well. The quality of the bowel preparation was                            adequate. The terminal ileum, ileocecal valve,                            appendiceal orifice, and rectum were photographed. Scope In: 10:07:55 AM Scope Out: 10:36:14 AM Scope Withdrawal Time: 0 hours 25 minutes 24 seconds  Total Procedure Duration: 0 hours 28 minutes 19 seconds  Findings:      The perianal exam findings include non-thrombosed external hemorrhoids.      A localized area of nodular mucosa was found at the ileocecal valve at       the lateral edge. I suspect this is normal mucosa however biopsies were       taken with a cold forceps for histology to ensure no adenomatous changes      A 6 mm polyp was found in the ascending colon. The polyp was sessile.       The polyp was removed with a cold snare. Resection and retrieval were       complete.      A 5 mm polyp was found in the hepatic flexure. The polyp  was sessile.       The polyp was removed with a cold snare. Resection and retrieval were       complete.      A 3 mm polyp was found in the transverse colon. The polyp was sessile.       The polyp was removed with a cold biopsy forceps. Resection and       retrieval were complete.      A 5 mm polyp was found in the recto-sigmoid colon. The polyp was       sessile. The polyp was removed with a cold snare. Resection and       retrieval were complete.      A 3 mm polyp was found in the rectum. The polyp was sessile. The polyp       was removed with a cold snare. Resection and  retrieval were complete.      A few small-mouthed diverticula were found in the left colon and right       colon.      A localized area of erythematous mucosa was found in the rectum (on       retroflexion) which I suspect is likely inflammatory changes related to       hemorrhoids, however biopsies were taken with a cold forceps for       histology to rule out adenomatous changes.      Internal hemorrhoids were found during retroflexion. The hemorrhoids       were moderate.      The exam was otherwise without abnormality.      The terminal ileum appeared normal. Complications:            No immediate complications. Estimated blood loss:                            Minimal. Estimated Blood Loss:     Estimated blood loss was minimal. Impression:               - Non-thrombosed external hemorrhoids found on                            perianal exam.                           - Nodular mucosa at the ileocecal valve. Biopsied.                           - One 6 mm polyp in the ascending colon, removed                            with a cold snare. Resected and retrieved.                           - One 5 mm polyp at the hepatic flexure, removed                            with a cold snare. Resected and retrieved.                           - One 3 mm polyp in the transverse colon, removed  with a cold biopsy forceps. Resected and retrieved.                           - One 5 mm polyp at the recto-sigmoid colon,                            removed with a cold snare. Resected and retrieved.                           - One 3 mm polyp in the rectum, removed with a cold                            snare. Resected and retrieved.                           - Diverticulosis in the left colon and in the right                            colon.                           - Erythematous mucosa in the rectum. Biopsied to                            rule out adenomatous changes                            - Internal hemorrhoids.                           - The examination was otherwise normal.                           - The examined portion of the ileum was normal. Recommendation:           - Patient has a contact number available for                            emergencies. The signs and symptoms of potential                            delayed complications were discussed with the                            patient. Return to normal activities tomorrow.                            Written discharge instructions were provided to the                            patient.                           - Resume previous diet.                           -  Continue present medications.                           - No aspirin, ibuprofen, naproxen, or other                            non-steroidal anti-inflammatory drugs for 2 weeks                            after polyp removal.                           - Await pathology results.                           - Repeat colonoscopy is recommended for                            surveillance. The colonoscopy date will be                            determined after pathology results from today's                            exam become available for review.                           - Follow up in the clinic for hemorrhoid banding as                            needed pending pathology normal. Procedure Code(s):        --- Professional ---                           [phone], Colonoscopy, flexible; with removal of                            tumor(s), polyp(s), or other lesion(s) by snare                            technique                           [id number], 59, Colonoscopy, flexible; with biopsy,                            single or multiple CPT copyright 2016 American [employer]. All rights reserved. Remo Lipps P. Havery Moros, MD Carlota Raspberry. Lance Galas, MD 05/13/2015 10:47:24 AM This report has been signed electronically. Number of Addenda: 0

## 2015-05-13 NOTE — Patient Instructions (Signed)
YOU HAD AN ENDOSCOPIC PROCEDURE TODAY AT [facility]:   Refer to the procedure report that was given to you for any specific questions about what was found during the examination.  If the procedure report does not answer your questions, please call your gastroenterologist to clarify.  If you requested that your care partner not be given the details of your procedure findings, then the procedure report has been included in a sealed envelope for you to review at your convenience later.  YOU SHOULD EXPECT: Some feelings of bloating in the abdomen. Passage of more gas than usual.  Walking can help get rid of the air that was put into your GI tract during the procedure and reduce the bloating. If you had a lower endoscopy (such as a colonoscopy or flexible sigmoidoscopy) you may notice spotting of blood in your stool or on the toilet paper. If you underwent a bowel prep for your procedure, you may not have a normal bowel movement for a few days.  Please Note:  You might notice some irritation and congestion in your nose or some drainage.  This is from the oxygen used during your procedure.  There is no need for concern and it should clear up in a day or so.  SYMPTOMS TO REPORT IMMEDIATELY:   Following lower endoscopy (colonoscopy or flexible sigmoidoscopy):  Excessive amounts of blood in the stool  Significant tenderness or worsening of abdominal pains  Swelling of the abdomen that is new, acute  Fever of 100F or higher   For urgent or emergent issues, a gastroenterologist can be reached at any hour by calling [phone].   DIET: Your first meal following the procedure should be a small meal and then it is ok to progress to your normal diet. Heavy or fried foods are harder to digest and may make you feel nauseous or bloated.  Likewise, meals heavy in dairy and vegetables can increase bloating.  Drink plenty of fluids but you should avoid alcoholic beverages for 24  hours.  ACTIVITY:  You should plan to take it easy for the rest of today and you should NOT DRIVE or use heavy machinery until tomorrow (because of the sedation medicines used during the test).    FOLLOW UP: Our staff will call the number listed on your records the next business day following your procedure to check on you and address any questions or concerns that you may have regarding the information given to you following your procedure. If we do not reach you, we will leave a message.  However, if you are feeling well and you are not experiencing any problems, there is no need to return our call.  We will assume that you have returned to your regular daily activities without incident.  If any biopsies were taken you will be contacted by phone or by letter within the next 1-3 weeks.  Please call us at [phone] if you have not heard about the biopsies in 3 weeks.    SIGNATURES/CONFIDENTIALITY: You and/or your care partner have signed paperwork which will be entered into your electronic medical record.  These signatures attest to the fact that that the information above on your After Visit Summary has been reviewed and is understood.  Full responsibility of the confidentiality of this discharge information lies with you and/or your care-partner.  Polyp/ Hemorrhoid handout given Await pathology Hold taking NSAIDS for 2 weeks Resume medication and diet

## 2015-05-13 NOTE — Progress Notes (Signed)
To PACU Pt awake and alert. Report to RN 

## 2015-05-14 ENCOUNTER — Telehealth: Payer: Self-pay | Admitting: Emergency Medicine

## 2015-05-14 NOTE — Telephone Encounter (Signed)
Left message, identifier present 

## 2015-05-19 ENCOUNTER — Encounter: Payer: Self-pay | Admitting: Gastroenterology

## 2015-06-12 ENCOUNTER — Other Ambulatory Visit: Payer: Self-pay | Admitting: Family Medicine

## 2015-06-15 ENCOUNTER — Other Ambulatory Visit: Payer: Self-pay | Admitting: General Practice

## 2015-06-15 MED ORDER — PRAVASTATIN SODIUM 40 MG PO TABS: 40.0000 mg | ORAL_TABLET | Freq: Every day | ORAL | Status: DC

## 2015-09-02 ENCOUNTER — Ambulatory Visit (INDEPENDENT_AMBULATORY_CARE_PROVIDER_SITE_OTHER): Payer: Commercial Managed Care - HMO | Admitting: Pulmonary Disease

## 2015-09-02 ENCOUNTER — Encounter: Payer: Self-pay | Admitting: Pulmonary Disease

## 2015-09-02 VITALS — BP 142/86 | HR 77 | Ht 72.0 in | Wt 324.0 lb

## 2015-09-02 DIAGNOSIS — E669 Obesity, unspecified: Secondary | ICD-10-CM | POA: Diagnosis not present

## 2015-09-02 DIAGNOSIS — J309 Allergic rhinitis, unspecified: Secondary | ICD-10-CM | POA: Diagnosis not present

## 2015-09-02 DIAGNOSIS — J4520 Mild intermittent asthma, uncomplicated: Secondary | ICD-10-CM

## 2015-09-02 DIAGNOSIS — G4733 Obstructive sleep apnea (adult) (pediatric): Secondary | ICD-10-CM

## 2015-09-02 MED ORDER — AZELASTINE HCL 0.1 % NA SOLN: 2.0000 | Freq: Two times a day (BID) | NASAL | Status: DC

## 2015-09-02 MED ORDER — FLUTICASONE PROPIONATE 50 MCG/ACT NA SUSP: 1.0000 | Freq: Two times a day (BID) | NASAL | Status: DC

## 2015-09-02 NOTE — Assessment & Plan Note (Addendum)
Patient has severe sleep apnea. Sleep study in 2012: AHI 88.Titrated up to 17 cm water. We can not get DL off his SD card.  We extensively discussed the importance of treating OSA and the need to use PAP therapy.   Continue with current cpap machine. Will ask [employer] to send [place] DL.  We will also ask [employer] if they can teach pt how to adjust humidity.  If congestion continues to bother him, he will call.  Was suggesting flonase/aztelin regularly but he wants to avoid meds.    Patient was instructed to have mask, tubings, filter, reservoir cleaned at least once a week with soapy water.  Patient was instructed to call the office if he/she is having issues with the PAP device.    I advised patient to obtain sufficient amount of sleep --  7 to 8 hours at least in a 24 hr period.  Patient was advised to follow good sleep hygiene.  Patient was advised NOT to engage in activities requiring concentration and/or vigilance if he/she is and  sleepy.  Patient is NOT to drive if he/she is sleepy.

## 2015-09-02 NOTE — Patient Instructions (Signed)
  It was a pleasure taking care of you today!  You are diagnosed with Obstructive Sleep Apnea or OSA.  You stop breathing  88 times an hour.   We will asked your DME company if they can get a download from her machine. Please call the office if you do NOT receive a phone call from [place] in a week or so.   Please make sure you use your CPAP device everytime you sleep.  We will monitor the usage of your machine per your insurance requirement.  Your insurance company may take the machine from you if you are not using it regularly.   Please clean the mask, tubings, filter, water reservoir with soapy water every week.  Please use distilled water for the water reservoir.   Please call the office or your machine provider (DME company) if you are having issues with the device.   We will order your flonase and astelin.  Return to clinic in 4 months

## 2015-09-02 NOTE — Assessment & Plan Note (Signed)
Weight reduction 

## 2015-09-02 NOTE — Assessment & Plan Note (Signed)
Likely chronic asthma with fixed airway obstruction.  Cont alb prn. Asthma is stable.  Pt to call if with worse SOB.

## 2015-09-02 NOTE — Progress Notes (Signed)
Subjective:    Patient ID: Tommy Jackson, male    DOB: [birth year], 60 y.o.   MRN: [id number]  HPI   This is the case of Tommy Jackson, 60 y.o. Male, who was referred by Dr. Shawna Orleans  in consultation regarding OSA.   As you very well know, patient was Dxed with OSA in 2012.  AHI was 88. He got a cpap machine -- unknown settings. He has been using cpap machine and feels better using it. More energy. Less sleepiness. No DL has been done. He gets supplies off the internet.  He has had "congestion" since starting cpap.  He has gotten used to it but still bothers him every now and then.   Pt was dxed with "asthma" in 2012. Uses alb every month. Diagnosis made during the time he was started on CPAP machine. Non smoker.  (-) Dx of COPD. Denies allergies. Has sinus congestion at HS, esp with cpap.         Review of Systems  Constitutional: Positive for unexpected weight change. Negative for fever.  HENT: Positive for congestion and sinus pressure. Negative for dental problem, ear pain, nosebleeds, postnasal drip, rhinorrhea, sneezing, sore throat and trouble swallowing.   Eyes: Positive for itching. Negative for redness.  Respiratory: Positive for cough and wheezing. Negative for chest tightness and shortness of breath.   Cardiovascular: Negative for palpitations and leg swelling.  Gastrointestinal: Negative for nausea and vomiting.  Genitourinary: Negative for dysuria.  Musculoskeletal: Positive for joint swelling.  Skin: Negative for rash.  Neurological: Negative for headaches.  Hematological: Does not bruise/bleed easily.  Psychiatric/Behavioral: Negative for dysphoric mood. The patient is not nervous/anxious.    Past Medical History  Diagnosis Date  . HYPERTENSION 11/29/2005  . HYPERLIPIDEMIA 02/07/2007  . OBESITY 10/01/2007  . SLEEP APNEA, OBSTRUCTIVE 10/01/2007  . ASTHMA 09/07/2009  . Herniated disc   . Allergy   . Sleep apnea     cpap   (-) DVT, CA  Family History  Problem  Relation Age of Onset  . Colon cancer Father 49  . Lung cancer Father   . CAD Father   . Diabetes Father   . Heart attack Father   . Colon cancer Brother 59  . Heart attack Paternal Grandfather   . CAD Brother      Past Surgical History  Procedure Laterality Date  . Shoulder surgery Right 1990  . Elbow surgery Right 2013  . Bicep surgery Right 2014  . Removal disc neck  2015    Social History   Social History  . Marital Status: Single    Spouse Name: N/A  . Number of Children: 3  . Years of Education: N/A   Occupational History  . retired truck Chief Technology Officer term disability    Social History Main Topics  . Smoking status: Never Smoker   . Smokeless tobacco: Never Used  . Alcohol Use: 0.0 oz/week    0 Standard drinks or equivalent per week     Comment: rare alcohol intake  . Drug Use: No  . Sexual Activity: Not on file   Other Topics Concern  . Not on file   Social History Narrative   Married with 3 kids.Retired Administrator. (-) ETOH. [place]   Allergies  Allergen Reactions  . Aspirin     REACTION: unspecified  . Glycerin Nausea Only  . Penicillins     REACTION: family hx     Outpatient Prescriptions Prior to Visit  Medication  Sig Dispense Refill  . albuterol (VENTOLIN HFA) 108 (90 Base) MCG/ACT inhaler Inhale 2 puffs into the lungs every 4 (four) hours as needed. 1 Inhaler 11  . amLODipine (NORVASC) 10 MG tablet take 1 tablet by mouth once daily 30 tablet 5  . Ascorbic Acid (VITAMIN C) 1000 MG tablet Take 1,000 mg by mouth daily.    . Cetirizine HCl (ZYRTEC PO) Take by mouth daily.    Marland Kitchen HYDROcodone-acetaminophen (VICODIN ES) 7.5-750 MG per tablet Take 1 tablet by mouth every 6 (six) hours as needed. Patient used this medication for pain in his arm.    . Lido-Capsaicin-Men-Methyl Sal (MEDI-PATCH-LIDOCAINE EX) Apply topically.    . metoprolol (LOPRESSOR) 100 MG tablet take 1 tablet by mouth twice a day 60 tablet 5  . Multiple Vitamin  (MULTIVITAMIN) capsule Take 1 capsule by mouth daily.      . pravastatin (PRAVACHOL) 40 MG tablet Take 1 tablet (40 mg total) by mouth daily. 90 tablet 1  . valsartan-hydrochlorothiazide (DIOVAN-HCT) 320-25 MG tablet take 1 tablet by mouth once daily 30 tablet 5  . azelastine (ASTELIN) 137 MCG/SPRAY nasal spray Place 2 sprays into both nostrils 2 (two) times daily. Use in each nostril as directed 30 mL 5  . fluticasone (FLONASE) 50 MCG/ACT nasal spray Place 2 sprays into both nostrils daily. 16 g 5  . HYDROcodone-homatropine (HYCODAN) 5-1.5 MG/5ML syrup Take 5 mLs by mouth every 8 (eight) hours as needed for cough. (Patient not taking: Reported on 09/02/2015) 120 mL 0   No facility-administered medications prior to visit.   Meds ordered this encounter  Medications  . fluticasone (FLONASE) 50 MCG/ACT nasal spray    Sig: Place 1 spray into both nostrils 2 (two) times daily.    Dispense:  16 g    Refill:  2  . azelastine (ASTELIN) 0.1 % nasal spray    Sig: Place 2 sprays into both nostrils 2 (two) times daily. Use in each nostril as directed    Dispense:  30 mL    Refill:  5          Objective:   Physical Exam  Vitals:  Filed Vitals:   09/02/15 1411  BP: 142/86  Pulse: 77  Height: 6' (1.829 m)  Weight: 324 lb (146.965 kg)  SpO2: 93%    Constitutional/General:  Pleasant, well-nourished, well-developed, not in any distress,  Comfortably seating.  Well kempt  Body mass index is 43.93 kg/(m^2). Wt Readings from Last 3 Encounters:  09/02/15 324 lb (146.965 kg)  05/13/15 299 lb (135.626 kg)  04/29/15 299 lb (135.626 kg)     HEENT: Pupils equal and reactive to light and accommodation. Anicteric sclerae. Normal nasal mucosa.   No oral  lesions,  mouth clear,  oropharynx clear, no postnasal drip. (-) Oral thrush. No dental caries.  Airway - Mallampati class IV  Neck: No masses. Midline trachea. No JVD, (-) LAD. (-) bruits appreciated.  Respiratory/Chest: Grossly normal  chest. (-) deformity. (-) Accessory muscle use.  Symmetric expansion. (-) Tenderness on palpation.  Resonant on percussion.  Diminished BS on both lower lung zones. (-) wheezing, crackles, rhonchi (-) egophony  Cardiovascular: Regular rate and  rhythm, heart sounds normal, no murmur or gallops, no peripheral edema  Gastrointestinal:  Normal bowel sounds. Soft, non-tender. No hepatosplenomegaly.  (-) masses.   Musculoskeletal:  Normal muscle tone. Normal gait.   Extremities: Grossly normal. (-) clubbing, cyanosis.  (-) edema  Skin: (-) rash,lesions seen.   Neurological/Psychiatric : alert, oriented  to time, place, person. Normal mood and affect            Assessment & Plan:  Obstructive sleep apnea Patient has severe sleep apnea. Sleep study in 2012: AHI 88.Titrated up to 17 cm water. We can not get DL off his SD card.  We extensively discussed the importance of treating OSA and the need to use PAP therapy.   Continue with current cpap machine. Will ask [employer] to send [place] DL.  We will also ask [employer] if they can teach pt how to adjust humidity.  If congestion continues to bother him, he will call.  Was suggesting flonase/aztelin regularly but he wants to avoid meds.    Patient was instructed to have mask, tubings, filter, reservoir cleaned at least once a week with soapy water.  Patient was instructed to call the office if he/she is having issues with the PAP device.    I advised patient to obtain sufficient amount of sleep --  7 to 8 hours at least in a 24 hr period.  Patient was advised to follow good sleep hygiene.  Patient was advised NOT to engage in activities requiring concentration and/or vigilance if he/she is and  sleepy.  Patient is NOT to drive if he/she is sleepy.    Asthma Likely chronic asthma with fixed airway obstruction.  Cont alb prn. Asthma is stable.  Pt to call if with worse SOB.   Allergic rhinitis Cont flonase and aztelin.    OBESITY Weight reduction     Thank you very much for letting me participate in this patient's care. Please do not hesitate to give me a call if you have any questions or concerns regarding the treatment plan.   Patient will follow up with me in 4 months    J. Shirl Harris, MD 09/02/2015   9:27 PM [facility] [place] Pager: [phone] Office: [phone], Fax: [phone]

## 2015-09-02 NOTE — Assessment & Plan Note (Signed)
Cont flonase and aztelin.

## 2015-10-04 ENCOUNTER — Telehealth: Payer: Self-pay | Admitting: Pulmonary Disease

## 2015-10-04 NOTE — Telephone Encounter (Signed)
DL on cpap 14 cm water for 1 month starting June 2017 >  97%, AHI 5.4.  Cont cpap for now.  Monica Becton, MD 10/04/2015, 4:36 AM [facility] Pager [phone] After 3 pm or if no answer, call [phone]

## 2015-10-13 ENCOUNTER — Telehealth: Payer: Self-pay | Admitting: Pulmonary Disease

## 2015-10-13 NOTE — Telephone Encounter (Signed)
Per 10/04/15 PN: DL on cpap 14 cm water for 1 month starting June 2017 >  97%, AHI 5.4.  Cont cpap for now.  Monica Becton, MD 10/04/2015, 4:36 AM [facility] Pager [phone] After 3 pm or if no answer, call [phone] ---  I spoke with patient about results and he verbalized understanding and had no questions.

## 2015-10-15 ENCOUNTER — Encounter: Payer: Self-pay | Admitting: Pulmonary Disease

## 2015-11-03 ENCOUNTER — Institutional Professional Consult (permissible substitution): Payer: Commercial Managed Care - HMO | Admitting: Pulmonary Disease

## 2015-11-04 ENCOUNTER — Ambulatory Visit (INDEPENDENT_AMBULATORY_CARE_PROVIDER_SITE_OTHER): Payer: Commercial Managed Care - HMO

## 2015-11-04 DIAGNOSIS — Z23 Encounter for immunization: Secondary | ICD-10-CM | POA: Diagnosis not present

## 2015-11-16 ENCOUNTER — Ambulatory Visit (INDEPENDENT_AMBULATORY_CARE_PROVIDER_SITE_OTHER): Payer: Commercial Managed Care - HMO | Admitting: Adult Health

## 2015-11-16 VITALS — BP 106/82 | Temp 98.6°F | Ht 72.0 in | Wt 335.0 lb

## 2015-11-16 DIAGNOSIS — M10072 Idiopathic gout, left ankle and foot: Secondary | ICD-10-CM | POA: Diagnosis not present

## 2015-11-16 DIAGNOSIS — M109 Gout, unspecified: Secondary | ICD-10-CM

## 2015-11-16 LAB — CBC
HEMATOCRIT: 42.2 % (ref 39.0–52.0)
HEMOGLOBIN: 14.6 g/dL (ref 13.0–17.0)
MCHC: 34.7 g/dL (ref 30.0–36.0)
MCV: 91.4 fl (ref 78.0–100.0)
Platelets: 176 10*3/uL (ref 150.0–400.0)
RBC: 4.61 Mil/uL (ref 4.22–5.81)
RDW: 13.5 % (ref 11.5–15.5)
WBC: 7.6 10*3/uL (ref 4.0–10.5)

## 2015-11-16 LAB — URIC ACID: URIC ACID, SERUM: 8 mg/dL — AB (ref 4.0–7.8)

## 2015-11-16 MED ORDER — INDOMETHACIN 50 MG PO CAPS: 50.0000 mg | ORAL_CAPSULE | Freq: Two times a day (BID) | ORAL | 1 refills | Status: DC

## 2015-11-16 NOTE — Patient Instructions (Addendum)
It was great seeing you again!  Your exam is consistent with gout. I have sent in a prescription for Indomethacin. I will follow up with you regarding your blood work if anything is wrong.   Follow up as needed   Gout Gout is an inflammatory arthritis caused by a buildup of uric acid crystals in the joints. Uric acid is a chemical that is normally present in the blood. When the level of uric acid in the blood is too high it can form crystals that deposit in your joints and tissues. This causes joint redness, soreness, and swelling (inflammation). Repeat attacks are common. Over time, uric acid crystals can form into masses (tophi) near a joint, destroying bone and causing disfigurement. Gout is treatable and often preventable. CAUSES  The disease begins with elevated levels of uric acid in the blood. Uric acid is produced by your body when it breaks down a naturally found substance called purines. Certain foods you eat, such as meats and fish, contain high amounts of purines. Causes of an elevated uric acid level include:  Being passed down from parent to child (heredity).  Diseases that cause increased uric acid production (such as obesity, psoriasis, and certain cancers).  Excessive alcohol use.  Diet, especially diets rich in meat and seafood.  Medicines, including certain cancer-fighting medicines (chemotherapy), water pills (diuretics), and aspirin.  Chronic kidney disease. The kidneys are no longer able to remove uric acid well.  Problems with metabolism. Conditions strongly associated with gout include:  Obesity.  High blood pressure.  High cholesterol.  Diabetes. Not everyone with elevated uric acid levels gets gout. It is not understood why some people get gout and others do not. Surgery, joint injury, and eating too much of certain foods are some of the factors that can lead to gout attacks. SYMPTOMS   An attack of gout comes on quickly. It causes intense pain with  redness, swelling, and warmth in a joint.  Fever can occur.  Often, only one joint is involved. Certain joints are more commonly involved:  Base of the big toe.  Knee.  Ankle.  Wrist.  Finger. Without treatment, an attack usually goes away in a few days to weeks. Between attacks, you usually will not have symptoms, which is different from many other forms of arthritis. DIAGNOSIS  Your caregiver will suspect gout based on your symptoms and exam. In some cases, tests may be recommended. The tests may include:  Blood tests.  Urine tests.  X-rays.  Joint fluid exam. This exam requires a needle to remove fluid from the joint (arthrocentesis). Using a microscope, gout is confirmed when uric acid crystals are seen in the joint fluid. TREATMENT  There are two phases to gout treatment: treating the sudden onset (acute) attack and preventing attacks (prophylaxis).  Treatment of an Acute Attack.  Medicines are used. These include anti-inflammatory medicines or steroid medicines.  An injection of steroid medicine into the affected joint is sometimes necessary.  The painful joint is rested. Movement can worsen the arthritis.  You may use warm or cold treatments on painful joints, depending which works best for you.  Treatment to Prevent Attacks.  If you suffer from frequent gout attacks, your caregiver may advise preventive medicine. These medicines are started after the acute attack subsides. These medicines either help your kidneys eliminate uric acid from your body or decrease your uric acid production. You may need to stay on these medicines for a very long time.  The early phase  of treatment with preventive medicine can be associated with an increase in acute gout attacks. For this reason, during the first few months of treatment, your caregiver may also advise you to take medicines usually used for acute gout treatment. Be sure you understand your caregiver's directions. Your  caregiver may make several adjustments to your medicine dose before these medicines are effective.  Discuss dietary treatment with your caregiver or dietitian. Alcohol and drinks high in sugar and fructose and foods such as meat, poultry, and seafood can increase uric acid levels. Your caregiver or dietitian can advise you on drinks and foods that should be limited. HOME CARE INSTRUCTIONS   Do not take aspirin to relieve pain. This raises uric acid levels.  Only take over-the-counter or prescription medicines for pain, discomfort, or fever as directed by your caregiver.  Rest the joint as much as possible. When in bed, keep sheets and blankets off painful areas.  Keep the affected joint raised (elevated).  Apply warm or cold treatments to painful joints. Use of warm or cold treatments depends on which works best for you.  Use crutches if the painful joint is in your leg.  Drink enough fluids to keep your urine clear or pale yellow. This helps your body get rid of uric acid. Limit alcohol, sugary drinks, and fructose drinks.  Follow your dietary instructions. Pay careful attention to the amount of protein you eat. Your daily diet should emphasize fruits, vegetables, whole grains, and fat-free or low-fat milk products. Discuss the use of coffee, vitamin C, and cherries with your caregiver or dietitian. These may be helpful in lowering uric acid levels.  Maintain a healthy body weight. SEEK MEDICAL CARE IF:   You develop diarrhea, vomiting, or any side effects from medicines.  You do not feel better in 24 hours, or you are getting worse. SEEK IMMEDIATE MEDICAL CARE IF:   Your joint becomes suddenly more tender, and you have chills or a fever. MAKE SURE YOU:   Understand these instructions.  Will watch your condition.  Will get help right away if you are not doing well or get worse.   This information is not intended to replace advice given to you by your health care provider. Make  sure you discuss any questions you have with your health care provider.   Document Released: 02/11/2000 Document Revised: 03/06/2014 Document Reviewed: 09/27/2011 Elsevier Interactive Patient Education [employer].

## 2015-11-16 NOTE — Progress Notes (Signed)
   Subjective:    Patient ID: Tommy Jackson, male    DOB: [birth year], 60 y.o.   MRN: [id number]  HPI  60 year old male who presents to the office today for an acute complaint of left great toe pain x 24 hours. He reports that yesterday his toe started to become swollen, red, warm and painful to touch. As the day went on the pain became worse. He took two [place] last night which helped with the pain. When he woke up this morning the pain had decreased but his toe was still red and swollen.   He does endorse eating a lot of shellfish over the weekend as well as processed meats.   Denies having a history of gout.   Review of Systems  Constitutional: Negative.   Musculoskeletal: Positive for arthralgias and joint swelling.  Skin: Positive for color change. Negative for wound.  All other systems reviewed and are negative.      Objective:   Physical Exam  Constitutional: He is oriented to person, place, and time. He appears well-developed and well-nourished. No distress.  Cardiovascular: Normal rate, regular rhythm, normal heart sounds and intact distal pulses.  Exam reveals no gallop and no friction rub.   No murmur heard. Pulmonary/Chest: Effort normal and breath sounds normal. No respiratory distress. He has no wheezes. He has no rales. He exhibits no tenderness.  Musculoskeletal: Normal range of motion. He exhibits edema (Left great MCP joint) and tenderness (left great MCP joint).  Neurological: He is alert and oriented to person, place, and time.  Skin: Skin is warm and dry. No rash noted. He is not diaphoretic. There is erythema (left great MCP joint ). No pallor.  Psychiatric: He has a normal mood and affect. His behavior is normal. Judgment and thought content normal.  Nursing note and vitals reviewed.     Assessment & Plan:  1. Acute gout of left foot, unspecified cause - Exam consistent with gout - Gave information on foods to avoid.  - indomethacin (INDOCIN) 50 MG  capsule; Take 1 capsule (50 mg total) by mouth 2 (two) times daily with a meal.  Dispense: 30 capsule; Refill: 1 - Uric Acid - CBC - Follow up if no improvement  Dorothyann Peng, NP

## 2015-11-27 ENCOUNTER — Other Ambulatory Visit: Payer: Self-pay | Admitting: Internal Medicine

## 2015-12-02 ENCOUNTER — Other Ambulatory Visit: Payer: Self-pay | Admitting: Internal Medicine

## 2015-12-02 ENCOUNTER — Other Ambulatory Visit: Payer: Self-pay | Admitting: *Deleted

## 2015-12-02 DIAGNOSIS — M109 Gout, unspecified: Secondary | ICD-10-CM

## 2015-12-02 MED ORDER — AMLODIPINE BESYLATE 10 MG PO TABS: 10.0000 mg | ORAL_TABLET | Freq: Every day | ORAL | 5 refills | Status: DC

## 2015-12-02 MED ORDER — INDOMETHACIN 50 MG PO CAPS: 50.0000 mg | ORAL_CAPSULE | Freq: Two times a day (BID) | ORAL | 5 refills | Status: DC

## 2015-12-02 NOTE — Telephone Encounter (Signed)
Pt need new Rx's for amlodipine, indomethacin, metoprolol, valsartan and pravastatin #90 and all others are #30  Pharm:  [place]  [employer].  [phone]

## 2016-01-12 ENCOUNTER — Ambulatory Visit: Payer: Commercial Managed Care - HMO | Admitting: Pulmonary Disease

## 2016-01-25 ENCOUNTER — Encounter: Payer: Self-pay | Admitting: Pulmonary Disease

## 2016-01-25 ENCOUNTER — Ambulatory Visit (INDEPENDENT_AMBULATORY_CARE_PROVIDER_SITE_OTHER): Payer: Commercial Managed Care - HMO | Admitting: Pulmonary Disease

## 2016-01-25 DIAGNOSIS — G4733 Obstructive sleep apnea (adult) (pediatric): Secondary | ICD-10-CM | POA: Diagnosis not present

## 2016-01-25 DIAGNOSIS — J301 Allergic rhinitis due to pollen: Secondary | ICD-10-CM | POA: Diagnosis not present

## 2016-01-25 DIAGNOSIS — J45909 Unspecified asthma, uncomplicated: Secondary | ICD-10-CM | POA: Diagnosis not present

## 2016-01-25 DIAGNOSIS — E669 Obesity, unspecified: Secondary | ICD-10-CM | POA: Diagnosis not present

## 2016-01-25 NOTE — Assessment & Plan Note (Signed)
Weight reduction 

## 2016-01-25 NOTE — Assessment & Plan Note (Signed)
Patient has severe sleep apnea. Sleep study in 2012: AHI 88.Titrated up to 17 cm water. Download done in August 2017: CPAP set at 14 cm water, AHI 5.4. Has a full face mask.  Feels better using CPAP. More energy. Less sleepiness.  Plan : We extensively discussed the importance of treating OSA and the need to use PAP therapy.   Continue with cpap 14 cm water.    Patient was instructed to have mask, tubings, filter, reservoir cleaned at least once a week with soapy water.  Patient was instructed to call the office if he/she is having issues with the PAP device.    I advised patient to obtain sufficient amount of sleep --  7 to 8 hours at least in a 24 hr period.  Patient was advised to follow good sleep hygiene.  Patient was advised NOT to engage in activities requiring concentration and/or vigilance if he/she is and  sleepy.  Patient is NOT to drive if he/she is sleepy.

## 2016-01-25 NOTE — Assessment & Plan Note (Signed)
Likely chronic asthma with fixed airway obstruction.  Cont alb prn. Asthma is mildly worse > better this am.  Zyrtec daily Pt to call if with worse SOB.

## 2016-01-25 NOTE — Assessment & Plan Note (Signed)
Cont flonase and aztelin.  Zyrtec daily

## 2016-01-25 NOTE — Progress Notes (Signed)
Subjective:    Patient ID: Tommy Jackson, male    DOB: Jan 21, 1956, 60 y.o.   MRN: [id number]  HPI   This is the case of Tommy Jackson, 60 y.o. Male, who was referred by Dr. Shawna Orleans  in consultation regarding OSA.   As you very well know, patient was Dxed with OSA in 2012.  AHI was 88. He got a cpap machine -- unknown settings. He has been using cpap machine and feels better using it. More energy. Less sleepiness. No DL has been done. He gets supplies off the internet.  He has had "congestion" since starting cpap.  He has gotten used to it but still bothers him every now and then.   Pt was dxed with "asthma" in 2012. Uses alb every month. Diagnosis made during the time he was started on CPAP machine. Non smoker.  (-) Dx of COPD. Denies allergies. Has sinus congestion at HS, esp with cpap.    ROV 01/24/16 Patient returns to the office as follow-up on his sleep apnea and asthma. Since last time, we got a download for June 2017 on CPAP 14 cm water and he was compliant and AHI was 5.4.      Feels better using CPAP. More energy. Less sleepiness. Asthma has been stable over the last 2 days, mild flare as he was exposed to dust doing cleaning. Significantly better today.                                       Review of Systems  Constitutional: Positive for unexpected weight change. Negative for fever.  HENT: Positive for congestion and sinus pressure. Negative for dental problem, ear pain, nosebleeds, postnasal drip, rhinorrhea, sneezing, sore throat and trouble swallowing.   Eyes: Positive for itching. Negative for redness.  Respiratory: Positive for cough and wheezing. Negative for chest tightness and shortness of breath.   Cardiovascular: Negative for palpitations and leg swelling.  Gastrointestinal: Negative for nausea and vomiting.  Genitourinary: Negative for dysuria.  Musculoskeletal: Positive for joint swelling.  Skin: Negative for rash.  Neurological: Negative for headaches.    Hematological: Does not bruise/bleed easily.  Psychiatric/Behavioral: Negative for dysphoric mood. The patient is not nervous/anxious.          Objective:   Physical Exam  Vitals:  Vitals:   01/25/16 1045  BP: (!) 154/104  Pulse: 90  SpO2: 94%  Weight: (!) 338 lb 9.6 oz (153.6 kg)  Height: 6' (1.829 m)    Constitutional/General:  Pleasant, well-nourished, well-developed, not in any distress,  Comfortably seating.  Well kempt  Body mass index is 45.92 kg/m. Wt Readings from Last 3 Encounters:  01/25/16 (!) 338 lb 9.6 oz (153.6 kg)  11/16/15 (!) 335 lb (152 kg)  09/02/15 (!) 324 lb (147 kg)     HEENT: Pupils equal and reactive to light and accommodation. Anicteric sclerae. Normal nasal mucosa.   No oral  lesions,  mouth clear,  oropharynx clear, no postnasal drip. (-) Oral thrush. No dental caries.  Airway - Mallampati class IV  Neck: No masses. Midline trachea. No JVD, (-) LAD. (-) bruits appreciated.  Respiratory/Chest: Grossly normal chest. (-) deformity. (-) Accessory muscle use.  Symmetric expansion. (-) Tenderness on palpation.  Resonant on percussion.  Diminished BS on both lower lung zones. (-) wheezing, crackles, rhonchi (-) egophony  Cardiovascular: Regular rate and  rhythm, heart sounds normal, no murmur or  gallops, no peripheral edema  Gastrointestinal:  Normal bowel sounds. Soft, non-tender. No hepatosplenomegaly.  (-) masses.   Musculoskeletal:  Normal muscle tone. Normal gait.   Extremities: Grossly normal. (-) clubbing, cyanosis.  (-) edema  Skin: (-) rash,lesions seen.   Neurological/Psychiatric : alert, oriented to time, place, person. Normal mood and affect            Assessment & Plan:  Obstructive sleep apnea Patient has severe sleep apnea. Sleep study in 2012: AHI 88.Titrated up to 17 cm water. Download done in August 2017: CPAP set at 14 cm water, AHI 5.4. Has a full face mask.  Feels better using CPAP. More energy.  Less sleepiness.  Plan : We extensively discussed the importance of treating OSA and the need to use PAP therapy.   Continue with cpap 14 cm water.    Patient was instructed to have mask, tubings, filter, reservoir cleaned at least once a week with soapy water.  Patient was instructed to call the office if he/she is having issues with the PAP device.    I advised patient to obtain sufficient amount of sleep --  7 to 8 hours at least in a 24 hr period.  Patient was advised to follow good sleep hygiene.  Patient was advised NOT to engage in activities requiring concentration and/or vigilance if he/she is and  sleepy.  Patient is NOT to drive if he/she is sleepy.   Asthma Likely chronic asthma with fixed airway obstruction.  Cont alb prn. Asthma is mildly worse > better this am.  Zyrtec daily Pt to call if with worse SOB.   Allergic rhinitis Cont flonase and aztelin.  Zyrtec daily  Obesity Weight reduction     Patient will follow up with me in 6 months    J. Shirl Harris, MD 01/25/2016   11:22 AM [facility] [place] Pager: [phone] Office: [phone], Fax: [phone]

## 2016-01-25 NOTE — Patient Instructions (Signed)
  It was a pleasure taking care of you today!  Continue using your CPAP machine.  Try taking Zyrtec 10 mg daily for allergies.   Please make sure you use your CPAP device everytime you sleep.  We will monitor the usage of your machine per your insurance requirement.  Your insurance company may take the machine from you if you are not using it regularly.   Please clean the mask, tubings, filter, water reservoir with soapy water every week.  Please use distilled water for the water reservoir.   Please call the office or your machine provider (DME company) if you are having issues with the device.   Return to clinic in 6 months    with Dr. Corrie Dandy.

## 2016-02-02 ENCOUNTER — Other Ambulatory Visit: Payer: Self-pay

## 2016-02-02 MED ORDER — VALSARTAN-HYDROCHLOROTHIAZIDE 320-25 MG PO TABS: 1.0000 | ORAL_TABLET | Freq: Every day | ORAL | 0 refills | Status: DC

## 2016-02-03 ENCOUNTER — Other Ambulatory Visit: Payer: Self-pay

## 2016-02-03 MED ORDER — METOPROLOL TARTRATE 100 MG PO TABS: 100.0000 mg | ORAL_TABLET | Freq: Two times a day (BID) | ORAL | 2 refills | Status: DC

## 2016-02-03 MED ORDER — PRAVASTATIN SODIUM 40 MG PO TABS: 40.0000 mg | ORAL_TABLET | Freq: Every day | ORAL | 1 refills | Status: DC

## 2016-02-03 MED ORDER — AMLODIPINE BESYLATE 10 MG PO TABS: 10.0000 mg | ORAL_TABLET | Freq: Every day | ORAL | 2 refills | Status: DC

## 2016-02-03 MED ORDER — PRAVASTATIN SODIUM 40 MG PO TABS: 40.0000 mg | ORAL_TABLET | Freq: Every day | ORAL | 2 refills | Status: DC

## 2016-02-11 ENCOUNTER — Telehealth: Payer: Self-pay | Admitting: Adult Health

## 2016-02-11 ENCOUNTER — Other Ambulatory Visit: Payer: Self-pay | Admitting: Adult Health

## 2016-02-11 NOTE — Telephone Encounter (Signed)
He would have to come in for an appointment, we just cant do Nebulizers

## 2016-02-11 NOTE — Telephone Encounter (Signed)
Please advise 

## 2016-02-11 NOTE — Telephone Encounter (Signed)
Will you please call patient and schedule an appt for next week. If he is still having symptoms, then he needs to be re-evaluated.

## 2016-02-11 NOTE — Telephone Encounter (Signed)
Pt would like to know if he can come in for a breathing treatment that cory gives him.  It would have to be later this afternoon.

## 2016-02-15 ENCOUNTER — Ambulatory Visit: Payer: Commercial Managed Care - HMO | Admitting: Adult Health

## 2016-02-24 NOTE — Telephone Encounter (Signed)
Scheduled pt an appt for 12/19 but he called and cancelled the appointment stating he is better.

## 2016-03-29 ENCOUNTER — Telehealth: Payer: Self-pay | Admitting: Adult Health

## 2016-03-29 ENCOUNTER — Other Ambulatory Visit: Payer: Self-pay

## 2016-03-29 ENCOUNTER — Ambulatory Visit (INDEPENDENT_AMBULATORY_CARE_PROVIDER_SITE_OTHER): Payer: Medicare HMO | Admitting: Adult Health

## 2016-03-29 ENCOUNTER — Encounter: Payer: Self-pay | Admitting: Adult Health

## 2016-03-29 VITALS — BP 142/80 | Ht 72.0 in | Wt 344.5 lb

## 2016-03-29 DIAGNOSIS — J40 Bronchitis, not specified as acute or chronic: Secondary | ICD-10-CM | POA: Diagnosis not present

## 2016-03-29 MED ORDER — AZITHROMYCIN 250 MG PO TABS: ORAL_TABLET | ORAL | 0 refills | Status: DC

## 2016-03-29 MED ORDER — PREDNISONE 10 MG PO TABS: ORAL_TABLET | ORAL | 0 refills | Status: DC

## 2016-03-29 NOTE — Telephone Encounter (Signed)
Pt state that the medication that was sent in to [facility] they no longer accept the pts insurance so they need to please be resent to [facility] in [place]

## 2016-03-29 NOTE — Progress Notes (Signed)
Subjective:    Patient ID: Tommy Jackson, male    DOB: 07-Jun-1955, 61 y.o.   MRN: [id number]  Cough  This is a new problem. The current episode started 1 to 4 weeks ago. The cough is non-productive. Associated symptoms include shortness of breath and wheezing. Pertinent negatives include no chills, ear congestion, ear pain, fever, nasal congestion, postnasal drip or rhinorrhea. Treatments tried: ALbuteral  The treatment provided no relief. His past medical history is significant for asthma and bronchitis.   Reports " it feels like a bronchitis flare"  Review of Systems  Constitutional: Negative for activity change, chills and fever.  HENT: Negative for ear pain, postnasal drip, rhinorrhea, sinus pain and sinus pressure.   Respiratory: Positive for cough, shortness of breath and wheezing.   Genitourinary: Negative.   Neurological: Negative.    Past Medical History:  Diagnosis Date  . Allergy   . ASTHMA 09/07/2009  . Herniated disc   . HYPERLIPIDEMIA 02/07/2007  . HYPERTENSION 11/29/2005  . OBESITY 10/01/2007  . Sleep apnea    cpap  . SLEEP APNEA, OBSTRUCTIVE 10/01/2007    Social History   Social History  . Marital status: Single    Spouse name: N/A  . Number of children: 3  . Years of education: N/A   Occupational History  . 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 Smoking status: Never Smoker  . Smokeless tobacco: Never Used  . Alcohol use 0.0 oz/week     Comment: rare alcohol intake  . Drug use: No  . Sexual activity: Not on file   Other Topics Concern  . Not on file   Social History Narrative  . No narrative on file    Past Surgical History:  Procedure Laterality Date  . bicep surgery Right 2014  . ELBOW SURGERY Right 2013  . removal disc neck  2015  . SHOULDER SURGERY Right 1990    Family History  Problem Relation Age of Onset  . Colon cancer Father 87  . Lung cancer Father   . CAD Father   . Diabetes Father    . Heart attack Father   . Colon cancer Brother 74  . Heart attack Paternal Grandfather   . CAD Brother     Allergies  Allergen Reactions  . Aspirin     REACTION: unspecified  . Glycerin Nausea Only  . Penicillins     REACTION: family hx    Current Outpatient Prescriptions on File Prior to Visit  Medication Sig Dispense Refill  . albuterol (VENTOLIN HFA) 108 (90 Base) MCG/ACT inhaler Inhale 2 puffs into the lungs every 4 (four) hours as needed. 1 Inhaler 11  . amLODipine (NORVASC) 10 MG tablet Take 1 tablet (10 mg total) by mouth daily. 90 tablet 2  . Ascorbic Acid (VITAMIN C) 1000 MG tablet Take 1,000 mg by mouth daily.    Marland Kitchen azelastine (ASTELIN) 0.1 % nasal spray Place 2 sprays into both nostrils 2 (two) times daily. Use in each nostril as directed 30 mL 5  . Cetirizine HCl (ZYRTEC PO) Take by mouth daily.    . fluticasone (FLONASE) 50 MCG/ACT nasal spray Place 1 spray into both nostrils 2 (two) times daily. 16 g 2  . HYDROcodone-acetaminophen (VICODIN ES) 7.5-750 MG per tablet Take 1 tablet by mouth every 6 (six) hours as needed. Patient used this medication for pain in his arm.    . indomethacin (INDOCIN) 50 MG capsule Take 1 capsule (  50 mg total) by mouth 2 (two) times daily with a meal. 30 capsule 5  . LIDODERM 5 %   0  . metoprolol (LOPRESSOR) 100 MG tablet Take 1 tablet (100 mg total) by mouth 2 (two) times daily. 180 tablet 2  . metoprolol (LOPRESSOR) 100 MG tablet Take 1 tablet (100 mg total) by mouth 2 (two) times daily. 180 tablet 2  . Multiple Vitamin (MULTIVITAMIN) capsule Take 1 capsule by mouth daily.      . pravastatin (PRAVACHOL) 40 MG tablet Take 1 tablet (40 mg total) by mouth daily. 90 tablet 1  . pravastatin (PRAVACHOL) 40 MG tablet Take 1 tablet (40 mg total) by mouth daily. 90 tablet 2  . valsartan-hydrochlorothiazide (DIOVAN-HCT) 320-25 MG tablet Take 1 tablet by mouth daily. 90 tablet 0   No current facility-administered medications on file prior to visit.      BP (!) 142/80   Ht 6' (1.829 m)   Wt (!) 344 lb 8 oz (156.3 kg)   BMI 46.72 kg/m       Objective:   Physical Exam  Constitutional: He is oriented to person, place, and time. He appears well-developed and well-nourished. No distress.  Cardiovascular: Normal rate, regular rhythm, normal heart sounds and intact distal pulses.  Exam reveals no gallop and no friction rub.   No murmur heard. Pulmonary/Chest: Effort normal. No respiratory distress. He has wheezes in the right middle field, the right lower field and the left upper field. He has no rhonchi. He exhibits no tenderness.  Neurological: He is alert and oriented to person, place, and time.  Skin: Skin is warm and dry. No rash noted. He is not diaphoretic. No erythema. No pallor.  Psychiatric: He has a normal mood and affect. His behavior is normal. Judgment and thought content normal.  Nursing note and vitals reviewed.     Assessment & Plan:  1. Bronchitis - No concern for pneuomonia  - azithromycin (ZITHROMAX Z-PAK) 250 MG tablet; Take 2 tablets on Day 1.  Then take 1 tablet daily.  Dispense: 6 tablet; Refill: 0 - predniSONE (DELTASONE) 10 MG tablet; 40 mg x 3 days, 20 mg x 3 days, 10 mg x 3 days  Dispense: 21 tablet; Refill: 0 - Follow up in 2-3 days if no improvement - Mucinex or Delsym during the day   Dorothyann Peng, NP

## 2016-03-29 NOTE — Telephone Encounter (Signed)
Prescriptions have been sent in. Thanks!

## 2016-04-06 ENCOUNTER — Other Ambulatory Visit: Payer: Self-pay | Admitting: Adult Health

## 2016-05-02 ENCOUNTER — Emergency Department (HOSPITAL_BASED_OUTPATIENT_CLINIC_OR_DEPARTMENT_OTHER)
Admission: EM | Admit: 2016-05-02 | Discharge: 2016-05-02 | Disposition: A | Discharge status: Home / Self Care | Payer: Medicare HMO | Attending: Emergency Medicine | Admitting: Emergency Medicine

## 2016-05-02 ENCOUNTER — Ambulatory Visit (INDEPENDENT_AMBULATORY_CARE_PROVIDER_SITE_OTHER): Payer: Medicare HMO | Admitting: Adult Health

## 2016-05-02 ENCOUNTER — Encounter: Payer: Self-pay | Admitting: Adult Health

## 2016-05-02 ENCOUNTER — Encounter (HOSPITAL_BASED_OUTPATIENT_CLINIC_OR_DEPARTMENT_OTHER): Payer: Self-pay

## 2016-05-02 ENCOUNTER — Emergency Department (HOSPITAL_BASED_OUTPATIENT_CLINIC_OR_DEPARTMENT_OTHER): Payer: Medicare HMO

## 2016-05-02 VITALS — BP 142/90 | Temp 97.7°F | Wt 346.6 lb

## 2016-05-02 DIAGNOSIS — M79661 Pain in right lower leg: Secondary | ICD-10-CM

## 2016-05-02 DIAGNOSIS — R600 Localized edema: Secondary | ICD-10-CM

## 2016-05-02 DIAGNOSIS — I10 Essential (primary) hypertension: Secondary | ICD-10-CM | POA: Insufficient documentation

## 2016-05-02 DIAGNOSIS — M7989 Other specified soft tissue disorders: Secondary | ICD-10-CM | POA: Diagnosis not present

## 2016-05-02 DIAGNOSIS — M79604 Pain in right leg: Secondary | ICD-10-CM | POA: Diagnosis present

## 2016-05-02 DIAGNOSIS — J45909 Unspecified asthma, uncomplicated: Secondary | ICD-10-CM | POA: Insufficient documentation

## 2016-05-02 NOTE — Progress Notes (Signed)
Subjective:    Patient ID: Tommy Jackson, male    DOB: [birth year], 61 y.o.   MRN: [id number]  HPI  61 year old male who presents to the office today for an acute complaint of right leg swelling. He report that his right leg started swelling suddenly about 2 days ago. He denies any trauma to the area. He has not been on any long trips. Denies any pain in calf, redness or warmth. Has not been experiencing any chest pain or shortness of breath   He feels as though the swelling has receded slightly over the last 24 hours   Denies any pain in hip but does have chronic pain in lower lumbar area  Review of Systems  Constitutional: Negative.   Respiratory: Negative.   Cardiovascular: Positive for leg swelling.  Gastrointestinal: Negative.   Hematological: Negative.   All other systems reviewed and are negative.  Past Medical History:  Diagnosis Date  . Allergy   . ASTHMA 09/07/2009  . Herniated disc   . HYPERLIPIDEMIA 02/07/2007  . HYPERTENSION 11/29/2005  . OBESITY 10/01/2007  . Sleep apnea    cpap  . SLEEP APNEA, OBSTRUCTIVE 10/01/2007    Social History   Social History  . Marital status: Single    Spouse name: N/A  . Number of children: 3  . Years of education: N/A   Occupational History  . 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 Smoking status: Never Smoker  . Smokeless tobacco: Never Used  . Alcohol use 0.0 oz/week     Comment: rare alcohol intake  . Drug use: No  . Sexual activity: Not on file   Other Topics Concern  . Not on file   Social History Narrative  . No narrative on file    Past Surgical History:  Procedure Laterality Date  . bicep surgery Right 2014  . ELBOW SURGERY Right 2013  . removal disc neck  2015  . SHOULDER SURGERY Right 1990    Family History  Problem Relation Age of Onset  . Colon cancer Father 46  . Lung cancer Father   . CAD Father   . Diabetes Father   . Heart attack Father   .  Colon cancer Brother 70  . Heart attack Paternal Grandfather   . CAD Brother     Allergies  Allergen Reactions  . Aspirin     REACTION: unspecified  . Glycerin Nausea Only  . Penicillins     REACTION: family hx    Current Outpatient Prescriptions on File Prior to Visit  Medication Sig Dispense Refill  . albuterol (VENTOLIN HFA) 108 (90 Base) MCG/ACT inhaler Inhale 2 puffs into the lungs every 4 (four) hours as needed. 1 Inhaler 11  . amLODipine (NORVASC) 10 MG tablet Take 1 tablet (10 mg total) by mouth daily. 90 tablet 2  . Ascorbic Acid (VITAMIN C) 1000 MG tablet Take 1,000 mg by mouth daily.    Marland Kitchen azelastine (ASTELIN) 0.1 % nasal spray Place 2 sprays into both nostrils 2 (two) times daily. Use in each nostril as directed 30 mL 5  . azithromycin (ZITHROMAX Z-PAK) 250 MG tablet Take 2 tablets on Day 1.  Then take 1 tablet daily. 6 tablet 0  . Cetirizine HCl (ZYRTEC PO) Take by mouth daily.    . fluticasone (FLONASE) 50 MCG/ACT nasal spray Place 1 spray into both nostrils 2 (two) times daily. 16 g 2  . HYDROcodone-acetaminophen (VICODIN ES) 7.5-750  MG per tablet Take 1 tablet by mouth every 6 (six) hours as needed. Patient used this medication for pain in his arm.    . indomethacin (INDOCIN) 50 MG capsule Take 1 capsule (50 mg total) by mouth 2 (two) times daily with a meal. 30 capsule 5  . LIDODERM 5 %   0  . metoprolol (LOPRESSOR) 100 MG tablet Take 1 tablet (100 mg total) by mouth 2 (two) times daily. 180 tablet 2  . metoprolol (LOPRESSOR) 100 MG tablet Take 1 tablet (100 mg total) by mouth 2 (two) times daily. 180 tablet 2  . Multiple Vitamin (MULTIVITAMIN) capsule Take 1 capsule by mouth daily.      . pravastatin (PRAVACHOL) 40 MG tablet Take 1 tablet (40 mg total) by mouth daily. 90 tablet 1  . pravastatin (PRAVACHOL) 40 MG tablet Take 1 tablet (40 mg total) by mouth daily. 90 tablet 2  . predniSONE (DELTASONE) 10 MG tablet 40 mg x 3 days, 20 mg x 3 days, 10 mg x 3 days 21 tablet  0  . valsartan-hydrochlorothiazide (DIOVAN-HCT) 320-25 MG tablet TAKE 1 TABLET EVERY DAY 90 tablet 0   No current facility-administered medications on file prior to visit.     BP (!) 142/90   Temp 97.7 F (36.5 C) (Oral)   Wt (!) 346 lb 9.6 oz (157.2 kg)   BMI 47.01 kg/m       Objective:   Physical Exam  Constitutional: He is oriented to person, place, and time. He appears well-developed and well-nourished. No distress.  Cardiovascular: Normal rate, normal heart sounds and intact distal pulses.  Exam reveals no gallop and no friction rub.   No murmur heard. Pulmonary/Chest: Effort normal and breath sounds normal. No respiratory distress. He has no wheezes. He has no rales. He exhibits no tenderness.  Musculoskeletal: Normal range of motion. He exhibits edema and tenderness. He exhibits no deformity.  Significant swelling noted in right leg.   Right leg 18 in in diameter Left leg 16.5 inches in diameter.   No redness, warmth or calf tenderness.  Negative Homans sign   Has slight pain along medial aspect of shin   Neurological: He is alert and oriented to person, place, and time.  Skin: Skin is warm and dry. No rash noted. He is not diaphoretic. No erythema. No pallor.  Psychiatric: He has a normal mood and affect. His behavior is normal. Judgment and thought content normal.  Nursing note and vitals reviewed.     Assessment & Plan:  1. Leg edema, right - Does not appear to be DVT but it cannot be r/o at this time. Due to sudden onset, I will have to r/o DVT and send to the ER. No signs of cellulitis. Possible venous insufficiency? Lymphedema? Pulmonary hypertension?    Dorothyann Peng, NP

## 2016-05-02 NOTE — ED Triage Notes (Addendum)
C/o pain/swelling to right LE x 2-3 days-denies injury-sent from PCP office to rule out DVT per pt-NAD-slow limping gait

## 2016-05-02 NOTE — ED Provider Notes (Signed)
[place] [facility] Provider Note   CSN: [id number] Arrival date & time: 05/02/16  1651     History   Chief Complaint Chief Complaint  Patient presents with  . Leg Pain    HPI Tommy Jackson is a 61 y.o. male.  The history is provided by the patient. No language interpreter was used.  Leg Pain      Tommy Jackson is a 61 y.o. male who presents to the Emergency Department complaining of leg pain.  He presents for evaluation of 2-3 days of right leg pain. He has no recent injury to the leg but notes that it has been more swollen with some pain in the knee with range of motion as well as pain in his calf with ambulation. He denies any associated chest pain, shortness of breath, fevers, vomiting, change in urinary output. No history of DVT or blood clots.  He does have a history of chronic back pain.  Past Medical History:  Diagnosis Date  . Allergy   . ASTHMA 09/07/2009  . Herniated disc   . HYPERLIPIDEMIA 02/07/2007  . HYPERTENSION 11/29/2005  . OBESITY 10/01/2007  . Sleep apnea    cpap  . SLEEP APNEA, OBSTRUCTIVE 10/01/2007    Patient Active Problem List   Diagnosis Date Noted  . Prediabetes 02/18/2015  . Left leg pain 09/01/2013  . Left knee pain 09/01/2013  . Left hip pain 08/04/2013  . Right ankle pain 08/04/2013  . Allergic rhinitis 04/09/2013  . Other abnormal glucose 10/24/2012  . Dermatitis 05/07/2012  . Cervical radiculopathy 02/22/2012  . HERNIATED DISC 05/11/2010  . Asthma 09/07/2009  . Obesity 10/01/2007  . Obstructive sleep apnea 10/01/2007  . Dyslipidemia 02/07/2007  . Essential hypertension 11/29/2005    Past Surgical History:  Procedure Laterality Date  . bicep surgery Right 2014  . ELBOW SURGERY Right 2013  . removal disc neck  2015  . SHOULDER SURGERY Right 1990       Home Medications    Prior to Admission medications   Medication Sig Start Date End Date Taking? Authorizing Provider  albuterol (VENTOLIN HFA) 108 (90 Base) MCG/ACT  inhaler Inhale 2 puffs into the lungs every 4 (four) hours as needed. 04/22/15   Dorothyann Peng, NP  amLODipine (NORVASC) 10 MG tablet Take 1 tablet (10 mg total) by mouth daily. 02/03/16   Dorothyann Peng, NP  Ascorbic Acid (VITAMIN C) 1000 MG tablet Take 1,000 mg by mouth daily.    Historical Provider, MD  azelastine (ASTELIN) 0.1 % nasal spray Place 2 sprays into both nostrils 2 (two) times daily. Use in each nostril as directed 09/02/15   Rush Landmark, MD  azithromycin (ZITHROMAX Z-PAK) 250 MG tablet Take 2 tablets on Day 1.  Then take 1 tablet daily. 03/29/16   Dorothyann Peng, NP  Cetirizine HCl (ZYRTEC PO) Take by mouth daily.    Historical Provider, MD  fluticasone (FLONASE) 50 MCG/ACT nasal spray Place 1 spray into both nostrils 2 (two) times daily. 09/02/15   Jose Shirl Harris, MD  HYDROcodone-acetaminophen (VICODIN ES) 7.5-750 MG per tablet Take 1 tablet by mouth every 6 (six) hours as needed. Patient used this medication for pain in his arm.    Historical Provider, MD  indomethacin (INDOCIN) 50 MG capsule Take 1 capsule (50 mg total) by mouth 2 (two) times daily with a meal. 12/02/15   Dorothyann Peng, NP  LIDODERM 5 %  01/04/16   Historical Provider, MD  metoprolol (LOPRESSOR)  100 MG tablet Take 1 tablet (100 mg total) by mouth 2 (two) times daily. 02/03/16   Dorothyann Peng, NP  metoprolol (LOPRESSOR) 100 MG tablet Take 1 tablet (100 mg total) by mouth 2 (two) times daily. 02/03/16   Dorothyann Peng, NP  Multiple Vitamin (MULTIVITAMIN) capsule Take 1 capsule by mouth daily.      Historical Provider, MD  pravastatin (PRAVACHOL) 40 MG tablet Take 1 tablet (40 mg total) by mouth daily. 02/03/16   Dorothyann Peng, NP  pravastatin (PRAVACHOL) 40 MG tablet Take 1 tablet (40 mg total) by mouth daily. 02/03/16   Dorothyann Peng, NP  predniSONE (DELTASONE) 10 MG tablet 40 mg x 3 days, 20 mg x 3 days, 10 mg x 3 days 03/29/16   Dorothyann Peng, NP  valsartan-hydrochlorothiazide (DIOVAN-HCT) 320-25 MG tablet TAKE 1  TABLET EVERY DAY 04/07/16   Dorothyann Peng, NP    Family History Family History  Problem Relation Age of Onset  . Colon cancer Father 39  . Lung cancer Father   . CAD Father   . Diabetes Father   . Heart attack Father   . Colon cancer Brother 71  . Heart attack Paternal Grandfather   . CAD Brother     Social History Social History  Substance Use Topics  . Smoking status: Never Smoker  . Smokeless tobacco: Never Used  . Alcohol use 0.0 oz/week     Comment: rare alcohol intake     Allergies   Aspirin; Glycerin; and Penicillins   Review of Systems Review of Systems  All other systems reviewed and are negative.    Physical Exam Updated Vital Signs BP 150/90 (BP Location: Right Arm)   Pulse 67   Temp 98.3 F (36.8 C) (Oral)   Resp 20   Ht 6' (1.829 m)   SpO2 97%   BMI 47.01 kg/m   Physical Exam  Constitutional: He is oriented to person, place, and time. He appears well-developed and well-nourished.  HENT:  Head: Normocephalic and atraumatic.  Cardiovascular: Normal rate and regular rhythm.   No murmur heard. Pulmonary/Chest: Effort normal and breath sounds normal. No respiratory distress.  Musculoskeletal:  Right calf with mild, nonpitting edema. 2+ DP pulses bilaterally. There is mild tenderness to the right medial calf without any cellulitis.  Neurological: He is alert and oriented to person, place, and time.  Skin: Skin is warm and dry.  Psychiatric: He has a normal mood and affect. His behavior is normal.  Nursing note and vitals reviewed.    ED Treatments / Results  Labs (all labs ordered are listed, but only abnormal results are displayed) Labs Reviewed - No data to display  EKG  EKG Interpretation None       Radiology US Venous Img Lower Unilateral Right  Result Date: 05/02/2016 CLINICAL DATA:  Right lower extremity edema for 2 days EXAM: Right LOWER EXTREMITY VENOUS DOPPLER ULTRASOUND TECHNIQUE: Gray-scale sonography with graded  compression, as well as color Doppler and duplex ultrasound were performed to evaluate the lower extremity deep venous systems from the level of the common femoral vein and including the common femoral, femoral, profunda femoral, popliteal and calf veins including the posterior tibial, peroneal and gastrocnemius veins when visible. The superficial great saphenous vein was also interrogated. Spectral Doppler was utilized to evaluate flow at rest and with distal augmentation maneuvers in the common femoral, femoral and popliteal veins. COMPARISON:  None. FINDINGS: Contralateral Common Femoral Vein: Respiratory phasicity is normal and symmetric with the symptomatic side.  No evidence of thrombus. Normal compressibility. Common Femoral Vein: No evidence of thrombus. Normal compressibility, respiratory phasicity and response to augmentation. Saphenofemoral Junction: No evidence of thrombus. Normal compressibility and flow on color Doppler imaging. Profunda Femoral Vein: No evidence of thrombus. Normal compressibility and flow on color Doppler imaging. Femoral Vein: No evidence of thrombus. Normal compressibility, respiratory phasicity and response to augmentation. Popliteal Vein: No evidence of thrombus. Normal compressibility, respiratory phasicity and response to augmentation. Calf Veins: No evidence of thrombus. Normal compressibility and flow on color Doppler imaging. Superficial Great Saphenous Vein: No evidence of thrombus. Normal compressibility and flow on color Doppler imaging. Venous Reflux:  None. Other Findings:  None. IMPRESSION: No evidence of deep venous thrombosis. Electronically Signed   By: Donavan Foil M.D.   On: 05/02/2016 19:48    Procedures Procedures (including critical care time)  Medications Ordered in ED Medications - No data to display   Initial Impression / Assessment and Plan / ED Course  I have reviewed the triage vital signs and the nursing notes.  Pertinent labs & imaging  results that were available during my care of the patient were reviewed by me and considered in my medical decision making (see chart for details).     Patient here for evaluation of right leg swelling with no known injuries. He has some mild tenderness to the right calf and shin with no evidence of infection. He is well perfused on examination. No evidence of DVT or vascular imaging. Discussed with patient home care for leg swelling with compressive stocking. The importance of PCP follow-up as well as return precautions.  Final Clinical Impressions(s) / ED Diagnoses   Final diagnoses:  Pain and swelling of right lower leg    New Prescriptions Discharge Medication List as of 05/02/2016  8:03 PM       Quintella Reichert, MD 05/02/16 2256

## 2016-05-02 NOTE — ED Notes (Signed)
Patient transported to Ultrasound 

## 2016-05-17 ENCOUNTER — Ambulatory Visit (INDEPENDENT_AMBULATORY_CARE_PROVIDER_SITE_OTHER)
Admission: RE | Admit: 2016-05-17 | Discharge: 2016-05-17 | Disposition: A | Discharge status: Home / Self Care | Payer: Medicare HMO | Source: Ambulatory Visit | Attending: Adult Health | Admitting: Adult Health

## 2016-05-17 ENCOUNTER — Encounter: Payer: Self-pay | Admitting: Adult Health

## 2016-05-17 ENCOUNTER — Ambulatory Visit (INDEPENDENT_AMBULATORY_CARE_PROVIDER_SITE_OTHER): Payer: Medicare HMO | Admitting: Adult Health

## 2016-05-17 VITALS — BP 138/74 | Temp 97.8°F | Ht 72.0 in | Wt 344.6 lb

## 2016-05-17 DIAGNOSIS — M25561 Pain in right knee: Secondary | ICD-10-CM

## 2016-05-17 DIAGNOSIS — R600 Localized edema: Secondary | ICD-10-CM | POA: Diagnosis not present

## 2016-05-17 MED ORDER — FUROSEMIDE 20 MG PO TABS: 20.0000 mg | ORAL_TABLET | Freq: Every day | ORAL | 0 refills | Status: DC

## 2016-05-17 NOTE — Progress Notes (Signed)
Subjective:    Patient ID: Calil Amor, male    DOB: [birth year], 61 y.o.   MRN: [id number]  HPI  61 year old male who  has a past medical history of Allergy; ASTHMA (09/07/2009); Herniated disc; HYPERLIPIDEMIA (02/07/2007); HYPERTENSION (11/29/2005); OBESITY (10/01/2007); Sleep apnea; and SLEEP APNEA, OBSTRUCTIVE (10/01/2007). He presents to the office today for follow up regarding right knee pain and swelling. I had seen him for this issue on 05/02/2016, at that time he reported right leg swelling and calf tenderness. I had sent him to the ER to r/o DVT. His [place] was negative for DVT. He was advised to wear compression socks, which he has been doing. He has noticed some improvement in his lower extremity edema. He continues to have edema in his lower extremities L>R as well as pain in his right knee. The pain is intermittent and is located on the medial side of his right knee. Pain is worse with palpation. He has not noticed any difficulty with walking.   He has been limiting his salt intake and has increased his water intake. Per his scale at home he has lost 8 pounds over the last month.    Review of Systems See HPI   Past Medical History:  Diagnosis Date  . Allergy   . ASTHMA 09/07/2009  . Herniated disc   . HYPERLIPIDEMIA 02/07/2007  . HYPERTENSION 11/29/2005  . OBESITY 10/01/2007  . Sleep apnea    cpap  . SLEEP APNEA, OBSTRUCTIVE 10/01/2007    Social History   Social History  . Marital status: Single    Spouse name: N/A  . Number of children: 3  . Years of education: N/A   Occupational History  . 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 Smoking status: Never Smoker  . Smokeless tobacco: Never Used  . Alcohol use 0.0 oz/week     Comment: rare alcohol intake  . Drug use: No  . Sexual activity: Not on file   Other Topics Concern  . Not on file   Social History Narrative  . No narrative on file    Past Surgical History:  Procedure  Laterality Date  . bicep surgery Right 2014  . ELBOW SURGERY Right 2013  . removal disc neck  2015  . SHOULDER SURGERY Right 1990    Family History  Problem Relation Age of Onset  . Colon cancer Father 60  . Lung cancer Father   . CAD Father   . Diabetes Father   . Heart attack Father   . Colon cancer Brother 50  . Heart attack Paternal Grandfather   . CAD Brother     Allergies  Allergen Reactions  . Aspirin     REACTION: unspecified  . Glycerin Nausea Only  . Penicillins     REACTION: family hx    Current Outpatient Prescriptions on File Prior to Visit  Medication Sig Dispense Refill  . albuterol (VENTOLIN HFA) 108 (90 Base) MCG/ACT inhaler Inhale 2 puffs into the lungs every 4 (four) hours as needed. 1 Inhaler 11  . amLODipine (NORVASC) 10 MG tablet Take 1 tablet (10 mg total) by mouth daily. 90 tablet 2  . Ascorbic Acid (VITAMIN C) 1000 MG tablet Take 1,000 mg by mouth daily.    Marland Kitchen azelastine (ASTELIN) 0.1 % nasal spray Place 2 sprays into both nostrils 2 (two) times daily. Use in each nostril as directed 30 mL 5  . azithromycin (ZITHROMAX Z-PAK) 250  MG tablet Take 2 tablets on Day 1.  Then take 1 tablet daily. 6 tablet 0  . Cetirizine HCl (ZYRTEC PO) Take by mouth daily.    . fluticasone (FLONASE) 50 MCG/ACT nasal spray Place 1 spray into both nostrils 2 (two) times daily. 16 g 2  . HYDROcodone-acetaminophen (VICODIN ES) 7.5-750 MG per tablet Take 1 tablet by mouth every 6 (six) hours as needed. Patient used this medication for pain in his arm.    . indomethacin (INDOCIN) 50 MG capsule Take 1 capsule (50 mg total) by mouth 2 (two) times daily with a meal. 30 capsule 5  . LIDODERM 5 %   0  . metoprolol (LOPRESSOR) 100 MG tablet Take 1 tablet (100 mg total) by mouth 2 (two) times daily. 180 tablet 2  . metoprolol (LOPRESSOR) 100 MG tablet Take 1 tablet (100 mg total) by mouth 2 (two) times daily. 180 tablet 2  . Multiple Vitamin (MULTIVITAMIN) capsule Take 1 capsule by  mouth daily.      . pravastatin (PRAVACHOL) 40 MG tablet Take 1 tablet (40 mg total) by mouth daily. 90 tablet 1  . pravastatin (PRAVACHOL) 40 MG tablet Take 1 tablet (40 mg total) by mouth daily. 90 tablet 2  . predniSONE (DELTASONE) 10 MG tablet 40 mg x 3 days, 20 mg x 3 days, 10 mg x 3 days 21 tablet 0  . valsartan-hydrochlorothiazide (DIOVAN-HCT) 320-25 MG tablet TAKE 1 TABLET EVERY DAY 90 tablet 0   No current facility-administered medications on file prior to visit.     BP 138/74 (BP Location: Left Arm, Patient Position: Sitting, Cuff Size: Normal)   Temp 97.8 F (36.6 C) (Oral)   Ht 6' (1.829 m)   Wt (!) 344 lb 9.6 oz (156.3 kg)   BMI 46.74 kg/m       Objective:   Physical Exam  Constitutional: He is oriented to person, place, and time. He appears well-developed and well-nourished. No distress.  Cardiovascular: Normal rate, regular rhythm, normal heart sounds and intact distal pulses.  Exam reveals no gallop and no friction rub.   No murmur heard. Pulmonary/Chest: Effort normal and breath sounds normal. No respiratory distress. He has no wheezes. He has no rales. He exhibits no tenderness.  Musculoskeletal: He exhibits edema (+ 1 pitting edema in bilateral lower extremities. ).       Right knee: He exhibits normal range of motion and no swelling. Tenderness found. MCL tenderness noted. No medial joint line, no lateral joint line, no LCL and no patellar tendon tenderness noted.  Neurological: He is alert and oriented to person, place, and time.  Skin: Skin is warm and dry. No rash noted. He is not diaphoretic. No erythema. No pallor.  Psychiatric: He has a normal mood and affect. His behavior is normal. Judgment and thought content normal.  Nursing note and vitals reviewed.     Assessment & Plan:  1. Acute pain of right knee - DG Knee 1-2 Views Right; Future - Consider Sports medicine referral. Possible MCL inflammation  2. Lower extremity edema - Unsure of what is causing  his lower extremity edema at this time. He is taking Norvasc and Metoprolol, but neither of these medications are new. His diet is improving but he is morbidly obese. Can consider echo in the future if no improvement. I am going to treat with one week of lasix and have him continue with compression socks  - furosemide (LASIX) 20 MG tablet; Take 1 tablet (20 mg  total) by mouth daily.  Dispense: 5 tablet; Refill: 0   Dorothyann Peng, NP

## 2016-06-01 ENCOUNTER — Encounter: Payer: Self-pay | Admitting: Adult Health

## 2016-06-01 ENCOUNTER — Ambulatory Visit (INDEPENDENT_AMBULATORY_CARE_PROVIDER_SITE_OTHER): Payer: Medicare HMO | Admitting: Adult Health

## 2016-06-01 VITALS — BP 140/84 | Temp 98.3°F | Wt 339.8 lb

## 2016-06-01 DIAGNOSIS — J40 Bronchitis, not specified as acute or chronic: Secondary | ICD-10-CM | POA: Diagnosis not present

## 2016-06-01 DIAGNOSIS — R600 Localized edema: Secondary | ICD-10-CM

## 2016-06-01 MED ORDER — IPRATROPIUM-ALBUTEROL 0.5-2.5 (3) MG/3ML IN SOLN: 3.0000 mL | Freq: Once | RESPIRATORY_TRACT | Status: DC

## 2016-06-01 MED ORDER — POTASSIUM CHLORIDE ER 10 MEQ PO TBCR: 10.0000 meq | EXTENDED_RELEASE_TABLET | Freq: Every day | ORAL | 1 refills | Status: DC

## 2016-06-01 MED ORDER — FUROSEMIDE 20 MG PO TABS: 20.0000 mg | ORAL_TABLET | Freq: Every day | ORAL | 3 refills | Status: DC

## 2016-06-01 MED ORDER — PREDNISONE 20 MG PO TABS: 20.0000 mg | ORAL_TABLET | Freq: Every day | ORAL | 0 refills | Status: DC

## 2016-06-01 NOTE — Progress Notes (Signed)
Subjective:    Patient ID: Tommy Jackson, male    DOB: 06/21/1955, 61 y.o.   MRN: [id number]  HPI  61 year old male who presents to the office today for an acute complaint of cough, chest congestions, SOB, and wheezing. His symptoms have been present for one week. When he uses his Ventolin inhaler he feels as though his breathing improves.   Additionally, he reports that edema in his lower extremities decreased significantly when he was taking Lasix ( I had placed him on Lasix for one week at the end of last month). As soon as the lasix was stopped he noticed that his legs were swelling again. Per patient " through diet and exercise as well as with the lasix I lost 19 pounds, when the lasix ran out, I put 7 of if back on."   Wt Readings from Last 3 Encounters:  06/01/16 (!) 339 lb 12.8 oz (154.1 kg)  05/17/16 (!) 344 lb 9.6 oz (156.3 kg)  05/02/16 (!) 346 lb 9.6 oz (157.2 kg)    Review of Systems  Constitutional: Negative.   HENT: Negative.   Respiratory: Positive for chest tightness, shortness of breath and wheezing.   Cardiovascular: Negative.   Gastrointestinal: Negative.   Musculoskeletal: Negative.   Neurological: Negative.   Hematological: Negative.   All other systems reviewed and are negative.  Past Medical History:  Diagnosis Date  . Allergy   . ASTHMA 09/07/2009  . Herniated disc   . HYPERLIPIDEMIA 02/07/2007  . HYPERTENSION 11/29/2005  . OBESITY 10/01/2007  . Sleep apnea    cpap  . SLEEP APNEA, OBSTRUCTIVE 10/01/2007    Social History   Social History  . Marital status: Single    Spouse name: N/A  . Number of children: 3  . Years of education: N/A   Occupational History  . 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 Smoking status: Never Smoker  . Smokeless tobacco: Never Used  . Alcohol use 0.0 oz/week     Comment: rare alcohol intake  . Drug use: No  . Sexual activity: Not on file   Other Topics Concern  .  Not on file   Social History Narrative  . No narrative on file    Past Surgical History:  Procedure Laterality Date  . bicep surgery Right 2014  . ELBOW SURGERY Right 2013  . removal disc neck  2015  . SHOULDER SURGERY Right 1990    Family History  Problem Relation Age of Onset  . Colon cancer Father 80  . Lung cancer Father   . CAD Father   . Diabetes Father   . Heart attack Father   . Colon cancer Brother 34  . Heart attack Paternal Grandfather   . CAD Brother     Allergies  Allergen Reactions  . Aspirin     REACTION: unspecified  . Glycerin Nausea Only  . Penicillins     REACTION: family hx    Current Outpatient Prescriptions on File Prior to Visit  Medication Sig Dispense Refill  . albuterol (VENTOLIN HFA) 108 (90 Base) MCG/ACT inhaler Inhale 2 puffs into the lungs every 4 (four) hours as needed. 1 Inhaler 11  . amLODipine (NORVASC) 10 MG tablet Take 1 tablet (10 mg total) by mouth daily. 90 tablet 2  . Ascorbic Acid (VITAMIN C) 1000 MG tablet Take 1,000 mg by mouth daily.    Marland Kitchen azelastine (ASTELIN) 0.1 % nasal spray Place 2 sprays  into both nostrils 2 (two) times daily. Use in each nostril as directed 30 mL 5  . azithromycin (ZITHROMAX Z-PAK) 250 MG tablet Take 2 tablets on Day 1.  Then take 1 tablet daily. 6 tablet 0  . Cetirizine HCl (ZYRTEC PO) Take by mouth daily.    . fluticasone (FLONASE) 50 MCG/ACT nasal spray Place 1 spray into both nostrils 2 (two) times daily. 16 g 2  . furosemide (LASIX) 20 MG tablet Take 1 tablet (20 mg total) by mouth daily. 5 tablet 0  . HYDROcodone-acetaminophen (VICODIN ES) 7.5-750 MG per tablet Take 1 tablet by mouth every 6 (six) hours as needed. Patient used this medication for pain in his arm.    . indomethacin (INDOCIN) 50 MG capsule Take 1 capsule (50 mg total) by mouth 2 (two) times daily with a meal. 30 capsule 5  . LIDODERM 5 %   0  . metoprolol (LOPRESSOR) 100 MG tablet Take 1 tablet (100 mg total) by mouth 2 (two) times  daily. 180 tablet 2  . metoprolol (LOPRESSOR) 100 MG tablet Take 1 tablet (100 mg total) by mouth 2 (two) times daily. 180 tablet 2  . Multiple Vitamin (MULTIVITAMIN) capsule Take 1 capsule by mouth daily.      . pravastatin (PRAVACHOL) 40 MG tablet Take 1 tablet (40 mg total) by mouth daily. 90 tablet 1  . pravastatin (PRAVACHOL) 40 MG tablet Take 1 tablet (40 mg total) by mouth daily. 90 tablet 2  . valsartan-hydrochlorothiazide (DIOVAN-HCT) 320-25 MG tablet TAKE 1 TABLET EVERY DAY 90 tablet 0   No current facility-administered medications on file prior to visit.     BP 140/84 (BP Location: Left Arm, Patient Position: Sitting, Cuff Size: Large)   Temp 98.3 F (36.8 C) (Oral)   Wt (!) 339 lb 12.8 oz (154.1 kg)   BMI 46.09 kg/m       Objective:   Physical Exam  Constitutional: He is oriented to person, place, and time. He appears well-developed and well-nourished. No distress.  Cardiovascular: Normal rate, regular rhythm, normal heart sounds and intact distal pulses.  Exam reveals no friction rub.   No murmur heard. Pulmonary/Chest: Effort normal. No respiratory distress. He has wheezes. He has no rales. He exhibits no tenderness.  Musculoskeletal: He exhibits edema (non pitting in lower extremities). He exhibits no tenderness.  Neurological: He is alert and oriented to person, place, and time.  Skin: Skin is warm and dry. No rash noted. He is not diaphoretic. No erythema. No pallor.  Psychiatric: He has a normal mood and affect. His behavior is normal. Judgment and thought content normal.  Nursing note and vitals reviewed.     Assessment & Plan:  1. Bronchitis - predniSONE (DELTASONE) 20 MG tablet; Take 1 tablet (20 mg total) by mouth daily with breakfast.  Dispense: 5 tablet; Refill: 0 - ipratropium-albuterol (DUONEB) 0.5-2.5 (3) MG/3ML nebulizer solution 3 mL; Take 3 mLs by nebulization once. - Endorsed being able to breath easier after DuoNeb. Wheezing had resolved  2. Lower  extremity edema - furosemide (LASIX) 20 MG tablet; Take 1 tablet (20 mg total) by mouth daily.  Dispense: 90 tablet; Refill: 3 - potassium chloride (K-DUR) 10 MEQ tablet; Take 1 tablet (10 mEq total) by mouth daily.  Dispense: 90 tablet; Refill: 1 - ECHOCARDIOGRAM COMPLETE; Future - BMP - Future - Consider referral to cardiology   Dorothyann Peng, NP

## 2016-06-02 ENCOUNTER — Telehealth: Payer: Self-pay | Admitting: Adult Health

## 2016-06-02 ENCOUNTER — Other Ambulatory Visit: Payer: Self-pay

## 2016-06-02 MED ORDER — ALBUTEROL SULFATE HFA 108 (90 BASE) MCG/ACT IN AERS: 2.0000 | INHALATION_SPRAY | RESPIRATORY_TRACT | 3 refills | Status: DC | PRN

## 2016-06-02 NOTE — Telephone Encounter (Signed)
Rx has been e-scribed to preferred pharmacy

## 2016-06-02 NOTE — Telephone Encounter (Signed)
° °  Pt said he is going out of town tomorrow and need the RX filled today    Pt request refill of the following:  albuterol (VENTOLIN HFA) 108 (90 Base) MCG/ACT inhaler   Phamacy:  [facility]

## 2016-06-02 NOTE — Telephone Encounter (Signed)
Ok to refill 

## 2016-06-02 NOTE — Telephone Encounter (Signed)
Ok to refill + 3 

## 2016-07-07 ENCOUNTER — Other Ambulatory Visit: Payer: Self-pay | Admitting: Adult Health

## 2016-07-07 NOTE — Telephone Encounter (Signed)
Ok to refill for one year  

## 2016-07-18 DIAGNOSIS — H5203 Hypermetropia, bilateral: Secondary | ICD-10-CM | POA: Diagnosis not present

## 2016-08-01 ENCOUNTER — Encounter: Payer: Self-pay | Admitting: Adult Health

## 2016-08-01 ENCOUNTER — Ambulatory Visit (INDEPENDENT_AMBULATORY_CARE_PROVIDER_SITE_OTHER): Payer: Medicare HMO | Admitting: Adult Health

## 2016-08-01 VITALS — BP 132/72 | Ht 72.0 in | Wt 340.7 lb

## 2016-08-01 DIAGNOSIS — J4521 Mild intermittent asthma with (acute) exacerbation: Secondary | ICD-10-CM | POA: Diagnosis not present

## 2016-08-01 NOTE — Progress Notes (Signed)
Subjective:    Patient ID: Tommy Jackson, male    DOB: 11/18/1955, 61 y.o.   MRN: [id number]  HPI  61 year old male who  has a past medical history of Allergy; ASTHMA (09/07/2009); Herniated disc; HYPERLIPIDEMIA (02/07/2007); HYPERTENSION (11/29/2005); OBESITY (10/01/2007); Sleep apnea; and SLEEP APNEA, OBSTRUCTIVE (10/01/2007).   He presents to the office today for an acute complaint of " bronchitis or asthma flare." He reports that he was in [place] over [place] day weekend and had an asthma or bronchitis flare. He believes that this is due to the humidity. Since returning to [place] his symptoms have improved but he continues to have a dry cough and feels as though he is wheezing. He does not currently feel SOB  Denies any fevers or feeling ill.   Review of Systems See HPI   Past Medical History:  Diagnosis Date  . Allergy   . ASTHMA 09/07/2009  . Herniated disc   . HYPERLIPIDEMIA 02/07/2007  . HYPERTENSION 11/29/2005  . OBESITY 10/01/2007  . Sleep apnea    cpap  . SLEEP APNEA, OBSTRUCTIVE 10/01/2007    Social History   Social History  . Marital status: Single    Spouse name: N/A  . Number of children: 3  . Years of education: N/A   Occupational History  . 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 Smoking status: Never Smoker  . Smokeless tobacco: Never Used  . Alcohol use 0.0 oz/week     Comment: rare alcohol intake  . Drug use: No  . Sexual activity: Not on file   Other Topics Concern  . Not on file   Social History Narrative  . No narrative on file    Past Surgical History:  Procedure Laterality Date  . bicep surgery Right 2014  . ELBOW SURGERY Right 2013  . removal disc neck  2015  . SHOULDER SURGERY Right 1990    Family History  Problem Relation Age of Onset  . Colon cancer Father 75  . Lung cancer Father   . CAD Father   . Diabetes Father   . Heart attack Father   . Colon cancer Brother 48  .  Heart attack Paternal Grandfather   . CAD Brother     Allergies  Allergen Reactions  . Aspirin     REACTION: unspecified  . Glycerin Nausea Only  . Penicillins     REACTION: family hx    Current Outpatient Prescriptions on File Prior to Visit  Medication Sig Dispense Refill  . albuterol (VENTOLIN HFA) 108 (90 Base) MCG/ACT inhaler Inhale 2 puffs into the lungs every 4 (four) hours as needed. 1 Inhaler 3  . amLODipine (NORVASC) 10 MG tablet Take 1 tablet (10 mg total) by mouth daily. 90 tablet 2  . Ascorbic Acid (VITAMIN C) 1000 MG tablet Take 1,000 mg by mouth daily.    Marland Kitchen azelastine (ASTELIN) 0.1 % nasal spray Place 2 sprays into both nostrils 2 (two) times daily. Use in each nostril as directed 30 mL 5  . Cetirizine HCl (ZYRTEC PO) Take by mouth daily.    . fluticasone (FLONASE) 50 MCG/ACT nasal spray Place 1 spray into both nostrils 2 (two) times daily. 16 g 2  . furosemide (LASIX) 20 MG tablet Take 1 tablet (20 mg total) by mouth daily. 90 tablet 3  . HYDROcodone-acetaminophen (VICODIN ES) 7.5-750 MG per tablet Take 1 tablet by mouth every 6 (six) hours as needed.  Patient used this medication for pain in his arm.    . indomethacin (INDOCIN) 50 MG capsule Take 1 capsule (50 mg total) by mouth 2 (two) times daily with a meal. 30 capsule 5  . LIDODERM 5 %   0  . metoprolol (LOPRESSOR) 100 MG tablet Take 1 tablet (100 mg total) by mouth 2 (two) times daily. 180 tablet 2  . Multiple Vitamin (MULTIVITAMIN) capsule Take 1 capsule by mouth daily.      . potassium chloride (K-DUR) 10 MEQ tablet Take 1 tablet (10 mEq total) by mouth daily. 90 tablet 1  . pravastatin (PRAVACHOL) 40 MG tablet Take 1 tablet (40 mg total) by mouth daily. 90 tablet 1  . valsartan-hydrochlorothiazide (DIOVAN-HCT) 320-25 MG tablet TAKE 1 TABLET EVERY DAY 90 tablet 3   No current facility-administered medications on file prior to visit.     BP 132/72 (BP Location: Left Arm, Patient Position: Sitting, Cuff Size:  Normal)   Ht 6' (1.829 m)   Wt (!) 340 lb 11.2 oz (154.5 kg)   BMI 46.21 kg/m       Objective:   Physical Exam  Constitutional: He is oriented to person, place, and time. He appears well-developed and well-nourished. No distress.  Cardiovascular: Normal rate, regular rhythm, normal heart sounds and intact distal pulses.  Exam reveals no gallop and no friction rub.   No murmur heard. Pulmonary/Chest: Effort normal. No respiratory distress. He has wheezes (trace wheezing throughout lung fields ). He has no rales. He exhibits no tenderness.  Neurological: He is alert and oriented to person, place, and time.  Skin: Skin is warm and dry. No rash noted. He is not diaphoretic. No erythema. No pallor.  Psychiatric: He has a normal mood and affect. His behavior is normal. Thought content normal.  Nursing note and vitals reviewed.     Assessment & Plan:  1. Mild intermittent asthma with exacerbation - Due to amount of exacerbations he has been having. I am going to start him on a daily inhaler.  - Sample of Advair given - Follow up if no improvement - Use rescue inhaler as needed  Dorothyann Peng, NP

## 2016-08-15 ENCOUNTER — Ambulatory Visit (HOSPITAL_COMMUNITY): Payer: Medicare HMO | Attending: Cardiovascular Disease

## 2016-08-15 ENCOUNTER — Other Ambulatory Visit: Payer: Self-pay

## 2016-08-15 DIAGNOSIS — Z6841 Body Mass Index (BMI) 40.0 and over, adult: Secondary | ICD-10-CM | POA: Diagnosis not present

## 2016-08-15 DIAGNOSIS — Z8249 Family history of ischemic heart disease and other diseases of the circulatory system: Secondary | ICD-10-CM | POA: Insufficient documentation

## 2016-08-15 DIAGNOSIS — E669 Obesity, unspecified: Secondary | ICD-10-CM | POA: Insufficient documentation

## 2016-08-15 DIAGNOSIS — J45909 Unspecified asthma, uncomplicated: Secondary | ICD-10-CM | POA: Insufficient documentation

## 2016-08-15 DIAGNOSIS — R600 Localized edema: Secondary | ICD-10-CM | POA: Diagnosis not present

## 2016-08-15 DIAGNOSIS — I10 Essential (primary) hypertension: Secondary | ICD-10-CM | POA: Diagnosis not present

## 2016-08-15 DIAGNOSIS — E785 Hyperlipidemia, unspecified: Secondary | ICD-10-CM | POA: Diagnosis not present

## 2016-08-15 DIAGNOSIS — G4733 Obstructive sleep apnea (adult) (pediatric): Secondary | ICD-10-CM | POA: Diagnosis not present

## 2016-08-15 MED ORDER — PERFLUTREN LIPID MICROSPHERE
1.0000 mL | INTRAVENOUS | Status: AC | PRN
  Administered 2016-08-15: 2 mL via INTRAVENOUS

## 2016-09-27 ENCOUNTER — Telehealth: Payer: Self-pay | Admitting: Family Medicine

## 2016-09-27 ENCOUNTER — Other Ambulatory Visit: Payer: Self-pay | Admitting: Adult Health

## 2016-09-27 MED ORDER — LOSARTAN POTASSIUM-HCTZ 100-25 MG PO TABS: 1.0000 | ORAL_TABLET | Freq: Every day | ORAL | 3 refills | Status: DC

## 2016-09-27 NOTE — Telephone Encounter (Signed)
Spoke to the pt and informed him of below message.  Scheduled him to see [facility] on 10/12/16 @ 1:30.  Pt will pick up new rx from the pharmacy.

## 2016-09-27 NOTE — Telephone Encounter (Signed)
-----   Message from Dorothyann Peng, NP sent at 09/27/2016 11:08 AM EDT ----- Regarding: valsartan  I received a letter form [place] that his Valsartan was recalled.   I have sent in a new prescription for Hyzaar. This medication is in the same class as valsartan and includes the diuretic.   I would like him to follow up in 2 weeks to make sure his blood pressure is controlled on this medication

## 2016-10-12 ENCOUNTER — Ambulatory Visit (INDEPENDENT_AMBULATORY_CARE_PROVIDER_SITE_OTHER): Payer: Medicare HMO | Admitting: Adult Health

## 2016-10-12 ENCOUNTER — Encounter: Payer: Self-pay | Admitting: Adult Health

## 2016-10-12 VITALS — BP 130/80 | Temp 98.3°F | Wt 350.0 lb

## 2016-10-12 DIAGNOSIS — I10 Essential (primary) hypertension: Secondary | ICD-10-CM | POA: Diagnosis not present

## 2016-10-12 NOTE — Progress Notes (Signed)
Subjective:    Patient ID: Tommy Jackson, male    DOB: [birth year], 61 y.o.   MRN: [id number]  HPI  61 year old male who  has a past medical history of Allergy; ASTHMA (09/07/2009); Herniated disc; HYPERLIPIDEMIA (02/07/2007); HYPERTENSION (11/29/2005); OBESITY (10/01/2007); Sleep apnea; and SLEEP APNEA, OBSTRUCTIVE (10/01/2007). He presents to the office today for follow up regarding hypertension. He received a letter recently that reported that his valsartan was on the recall list. He was switched to Hyzaar 100-25 mg  Today in the office he reports that he has not noticed any side effects of this medication. He is not checking his BP at home. In the office today his BP is 130/80  He is gaining weight but is not exercising on a regular basis nor following a diet. He would like to be referred to weight loss clinic   Wt Readings from Last 3 Encounters:  10/12/16 (!) 350 lb (158.8 kg)  08/01/16 (!) 340 lb 11.2 oz (154.5 kg)  06/01/16 (!) 339 lb 12.8 oz (154.1 kg)    Review of Systems See HPI   Past Medical History:  Diagnosis Date  . Allergy   . ASTHMA 09/07/2009  . Herniated disc   . HYPERLIPIDEMIA 02/07/2007  . HYPERTENSION 11/29/2005  . OBESITY 10/01/2007  . Sleep apnea    cpap  . SLEEP APNEA, OBSTRUCTIVE 10/01/2007    Social History   Social History  . Marital status: Single    Spouse name: N/A  . Number of children: 3  . Years of education: N/A   Occupational History  . 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 Smoking status: Never Smoker  . Smokeless tobacco: Never Used  . Alcohol use 0.0 oz/week     Comment: rare alcohol intake  . Drug use: No  . Sexual activity: Not on file   Other Topics Concern  . Not on file   Social History Narrative  . No narrative on file    Past Surgical History:  Procedure Laterality Date  . bicep surgery Right 2014  . ELBOW SURGERY Right 2013  . removal disc neck  2015  . SHOULDER SURGERY  Right 1990    Family History  Problem Relation Age of Onset  . Colon cancer Father 68  . Lung cancer Father   . CAD Father   . Diabetes Father   . Heart attack Father   . Colon cancer Brother 25  . Heart attack Paternal Grandfather   . CAD Brother     Allergies  Allergen Reactions  . Aspirin     REACTION: unspecified  . Glycerin Nausea Only  . Penicillins     REACTION: family hx    Current Outpatient Prescriptions on File Prior to Visit  Medication Sig Dispense Refill  . albuterol (VENTOLIN HFA) 108 (90 Base) MCG/ACT inhaler Inhale 2 puffs into the lungs every 4 (four) hours as needed. 1 Inhaler 3  . amLODipine (NORVASC) 10 MG tablet Take 1 tablet (10 mg total) by mouth daily. 90 tablet 2  . Ascorbic Acid (VITAMIN C) 1000 MG tablet Take 1,000 mg by mouth daily.    Marland Kitchen azelastine (ASTELIN) 0.1 % nasal spray Place 2 sprays into both nostrils 2 (two) times daily. Use in each nostril as directed 30 mL 5  . Cetirizine HCl (ZYRTEC PO) Take by mouth daily.    . fluticasone (FLONASE) 50 MCG/ACT nasal spray Place 1 spray into both nostrils 2 (  two) times daily. 16 g 2  . furosemide (LASIX) 20 MG tablet Take 1 tablet (20 mg total) by mouth daily. 90 tablet 3  . HYDROcodone-acetaminophen (VICODIN ES) 7.5-750 MG per tablet Take 1 tablet by mouth every 6 (six) hours as needed. Patient used this medication for pain in his arm.    Marland Kitchen LIDODERM 5 %   0  . losartan-hydrochlorothiazide (HYZAAR) 100-25 MG tablet Take 1 tablet by mouth daily. 90 tablet 3  . metoprolol (LOPRESSOR) 100 MG tablet Take 1 tablet (100 mg total) by mouth 2 (two) times daily. 180 tablet 2  . Multiple Vitamin (MULTIVITAMIN) capsule Take 1 capsule by mouth daily.      . potassium chloride (K-DUR) 10 MEQ tablet Take 1 tablet (10 mEq total) by mouth daily. 90 tablet 1  . pravastatin (PRAVACHOL) 40 MG tablet Take 1 tablet (40 mg total) by mouth daily. 90 tablet 1   No current facility-administered medications on file prior to  visit.     BP (!) 146/100   Temp 98.3 F (36.8 C) (Oral)   Wt (!) 350 lb (158.8 kg)   BMI 47.47 kg/m       Objective:   Physical Exam  Constitutional: He is oriented to person, place, and time. He appears well-developed and well-nourished. No distress.  Cardiovascular: Normal rate, regular rhythm, normal heart sounds and intact distal pulses.  Exam reveals no gallop.   No murmur heard. Pulmonary/Chest: Effort normal and breath sounds normal. No respiratory distress. He has no wheezes. He has no rales. He exhibits no tenderness.  Neurological: He is alert and oriented to person, place, and time.  Skin: He is not diaphoretic.  Nursing note and vitals reviewed.   Assessment & Plan:  1. Essential hypertension - Well controlled on current medication  - No change in therapy at this time   2. Morbid obesity (Lip[place])  - Amb Ref to Medical Weight Management   Dorothyann Peng, NP

## 2016-10-17 ENCOUNTER — Other Ambulatory Visit: Payer: Self-pay | Admitting: Adult Health

## 2016-10-18 NOTE — Telephone Encounter (Signed)
No recent lipid panel.  Please advise.

## 2016-10-18 NOTE — Telephone Encounter (Signed)
Sent to the pharmacy by e-scribe as instructed.  Pt has made future cpx appt.

## 2016-10-18 NOTE — Telephone Encounter (Signed)
It is ok to refill for 6 months. Please have him make an appointment for a CPE

## 2016-10-18 NOTE — Telephone Encounter (Signed)
Left a message for a return call.

## 2016-11-01 ENCOUNTER — Ambulatory Visit (INDEPENDENT_AMBULATORY_CARE_PROVIDER_SITE_OTHER): Payer: Medicare HMO | Admitting: Adult Health

## 2016-11-01 ENCOUNTER — Other Ambulatory Visit: Payer: Self-pay | Admitting: Adult Health

## 2016-11-01 VITALS — BP 136/86 | Temp 98.5°F | Ht 73.0 in | Wt 356.0 lb

## 2016-11-01 DIAGNOSIS — Z125 Encounter for screening for malignant neoplasm of prostate: Secondary | ICD-10-CM

## 2016-11-01 DIAGNOSIS — I10 Essential (primary) hypertension: Secondary | ICD-10-CM

## 2016-11-01 DIAGNOSIS — E669 Obesity, unspecified: Secondary | ICD-10-CM

## 2016-11-01 DIAGNOSIS — Z0000 Encounter for general adult medical examination without abnormal findings: Secondary | ICD-10-CM | POA: Diagnosis not present

## 2016-11-01 DIAGNOSIS — R7303 Prediabetes: Secondary | ICD-10-CM | POA: Diagnosis not present

## 2016-11-01 LAB — POC URINALSYSI DIPSTICK (AUTOMATED)
BILIRUBIN UA: NEGATIVE
Blood, UA: NEGATIVE
GLUCOSE UA: NEGATIVE
Ketones, UA: NEGATIVE
LEUKOCYTES UA: NEGATIVE
NITRITE UA: NEGATIVE
Protein, UA: NEGATIVE
Spec Grav, UA: 1.02 (ref 1.010–1.025)
Urobilinogen, UA: 0.2 E.U./dL
pH, UA: 6 (ref 5.0–8.0)

## 2016-11-01 LAB — LIPID PANEL
CHOL/HDL RATIO: 4
CHOLESTEROL: 148 mg/dL (ref 0–200)
HDL: 40.5 mg/dL (ref >39.00)
NonHDL: 107.43
TRIGLYCERIDES: 307 mg/dL — AB (ref 0.0–149.0)
VLDL: 61.4 mg/dL — AB (ref 0.0–40.0)

## 2016-11-01 LAB — CBC WITH DIFFERENTIAL/PLATELET
BASOS ABS: 0 10*3/uL (ref 0.0–0.1)
Basophils Relative: 0.6 % (ref 0.0–3.0)
EOS ABS: 0.4 10*3/uL (ref 0.0–0.7)
Eosinophils Relative: 5 % (ref 0.0–5.0)
HCT: 44 % (ref 39.0–52.0)
HEMOGLOBIN: 14.9 g/dL (ref 13.0–17.0)
LYMPHS PCT: 33 % (ref 12.0–46.0)
Lymphs Abs: 2.5 10*3/uL (ref 0.7–4.0)
MCHC: 34 g/dL (ref 30.0–36.0)
MCV: 94.6 fl (ref 78.0–100.0)
MONO ABS: 0.6 10*3/uL (ref 0.1–1.0)
Monocytes Relative: 7.4 % (ref 3.0–12.0)
Neutro Abs: 4.1 10*3/uL (ref 1.4–7.7)
Neutrophils Relative %: 54 % (ref 43.0–77.0)
PLATELETS: 179 10*3/uL (ref 150.0–400.0)
RBC: 4.65 Mil/uL (ref 4.22–5.81)
RDW: 13 % (ref 11.5–15.5)
WBC: 7.7 10*3/uL (ref 4.0–10.5)

## 2016-11-01 LAB — BASIC METABOLIC PANEL
BUN: 17 mg/dL (ref 6–23)
CALCIUM: 9.5 mg/dL (ref 8.4–10.5)
CO2: 29 mEq/L (ref 19–32)
CREATININE: 0.81 mg/dL (ref 0.40–1.50)
Chloride: 101 mEq/L (ref 96–112)
GFR: 103.21 mL/min (ref >60.00)
Glucose, Bld: 113 mg/dL — ABNORMAL HIGH (ref 70–99)
Potassium: 3.8 mEq/L (ref 3.5–5.1)
Sodium: 140 mEq/L (ref 135–145)

## 2016-11-01 LAB — HEPATIC FUNCTION PANEL
ALT: 39 U/L (ref 0–53)
AST: 27 U/L (ref 0–37)
Albumin: 4.6 g/dL (ref 3.5–5.2)
Alkaline Phosphatase: 43 U/L (ref 39–117)
BILIRUBIN TOTAL: 0.5 mg/dL (ref 0.2–1.2)
Bilirubin, Direct: 0.1 mg/dL (ref 0.0–0.3)
Total Protein: 6.9 g/dL (ref 6.0–8.3)

## 2016-11-01 LAB — HEMOGLOBIN A1C: HEMOGLOBIN A1C: 6.4 % (ref 4.6–6.5)

## 2016-11-01 LAB — LDL CHOLESTEROL, DIRECT: Direct LDL: 49 mg/dL

## 2016-11-01 LAB — PSA: PSA: 0.66 ng/mL (ref 0.10–4.00)

## 2016-11-01 LAB — TSH: TSH: 1.34 u[IU]/mL (ref 0.35–4.50)

## 2016-11-01 NOTE — Patient Instructions (Signed)
It was great seeing you today   I will follow up with you regarding your blood work.   Please work on weight loss through diet and exercise   Follow up in one year for your next physical or sooner if needed  Health Maintenance, Male A healthy lifestyle and preventative care can promote health and wellness.  Maintain regular health, dental, and eye exams.  Eat a healthy diet. Foods like vegetables, fruits, whole grains, low-fat dairy products, and lean protein foods contain the nutrients you need and are low in calories. Decrease your intake of foods high in solid fats, added sugars, and salt. Get information about a proper diet from your health care provider, if necessary.  Regular physical exercise is one of the most important things you can do for your health. Most adults should get at least 150 minutes of moderate-intensity exercise (any activity that increases your heart rate and causes you to sweat) each week. In addition, most adults need muscle-strengthening exercises on 2 or more days a week.   Maintain a healthy weight. The body mass index (BMI) is a screening tool to identify possible weight problems. It provides an estimate of body fat based on height and weight. Your health care provider can find your BMI and can help you achieve or maintain a healthy weight. For males 20 years and older:  A BMI below 18.5 is considered underweight.  A BMI of 18.5 to 24.9 is normal.  A BMI of 25 to 29.9 is considered overweight.  A BMI of 30 and above is considered obese.  Maintain normal blood lipids and cholesterol by exercising and minimizing your intake of saturated fat. Eat a balanced diet with plenty of fruits and vegetables. Blood tests for lipids and cholesterol should begin at age 42 and be repeated every 5 years. If your lipid or cholesterol levels are high, you are over age 31, or you are at high risk for heart disease, you may need your cholesterol levels checked more  frequently.Ongoing high lipid and cholesterol levels should be treated with medicines if diet and exercise are not working.  If you smoke, find out from your health care provider how to quit. If you do not use tobacco, do not start.  Lung cancer screening is recommended for adults aged 55-80 years who are at high risk for developing lung cancer because of a history of smoking. A yearly low-dose CT scan of the lungs is recommended for people who have at least a 30-pack-year history of smoking and are current smokers or have quit within the past 15 years. A pack year of smoking is smoking an average of 1 pack of cigarettes a day for 1 year (for example, a 30-pack-year history of smoking could mean smoking 1 pack a day for 30 years or 2 packs a day for 15 years). Yearly screening should continue until the smoker has stopped smoking for at least 15 years. Yearly screening should be stopped for people who develop a health problem that would prevent them from having lung cancer treatment.  If you choose to drink alcohol, do not have more than 2 drinks per day. One drink is considered to be 12 oz (360 mL) of beer, 5 oz (150 mL) of wine, or 1.5 oz (45 mL) of liquor.  Avoid the use of street drugs. Do not share needles with anyone. Ask for help if you need support or instructions about stopping the use of drugs.  High blood pressure causes heart disease  and increases the risk of stroke. High blood pressure is more likely to develop in:  People who have blood pressure in the end of the normal range (100-139/85-89 mm Hg).  People who are overweight or obese.  People who are African American.  If you are 77-80 years of age, have your blood pressure checked every 3-5 years. If you are 42 years of age or older, have your blood pressure checked every year. You should have your blood pressure measured twice--once when you are at a hospital or clinic, and once when you are not at a hospital or clinic. Record the  average of the two measurements. To check your blood pressure when you are not at a hospital or clinic, you can use:  An automated blood pressure machine at a pharmacy.  A home blood pressure monitor.  If you are 25-4 years old, ask your health care provider if you should take aspirin to prevent heart disease.  Diabetes screening involves taking a blood sample to check your fasting blood sugar level. This should be done once every 3 years after age 62 if you are at a normal weight and without risk factors for diabetes. Testing should be considered at a younger age or be carried out more frequently if you are overweight and have at least 1 risk factor for diabetes.  Colorectal cancer can be detected and often prevented. Most routine colorectal cancer screening begins at the age of 66 and continues through age 72. However, your health care provider may recommend screening at an earlier age if you have risk factors for colon cancer. On a yearly basis, your health care provider may provide home test kits to check for hidden blood in the stool. A small camera at the end of a tube may be used to directly examine the colon (sigmoidoscopy or colonoscopy) to detect the earliest forms of colorectal cancer. Talk to your health care provider about this at age 30 when routine screening begins. A direct exam of the colon should be repeated every 5-10 years through age 100, unless early forms of precancerous polyps or small growths are found.  People who are at an increased risk for hepatitis B should be screened for this virus. You are considered at high risk for hepatitis B if:  You were born in a country where hepatitis B occurs often. Talk with your health care provider about which countries are considered high risk.  Your parents were born in a high-risk country and you have not received a shot to protect against hepatitis B (hepatitis B vaccine).  You have HIV or AIDS.  You use needles to inject street  drugs.  You live with, or have sex with, someone who has hepatitis B.  You are a man who has sex with other men (MSM).  You get hemodialysis treatment.  You take certain medicines for conditions like cancer, organ transplantation, and autoimmune conditions.  Hepatitis C blood testing is recommended for all people born from [birth year] through 1964 and any individual with known risk factors for hepatitis C.  Healthy men should no longer receive prostate-specific antigen (PSA) blood tests as part of routine cancer screening. Talk to your health care provider about prostate cancer screening.  Testicular cancer screening is not recommended for adolescents or adult males who have no symptoms. Screening includes self-exam, a health care provider exam, and other screening tests. Consult with your health care provider about any symptoms you have or any concerns you have about testicular cancer.  Practice safe sex. Use condoms and avoid high-risk sexual practices to reduce the spread of sexually transmitted infections (STIs).  You should be screened for STIs, including gonorrhea and chlamydia if:  You are sexually active and are younger than 24 years.  You are older than 24 years, and your health care provider tells you that you are at risk for this type of infection.  Your sexual activity has changed since you were last screened, and you are at an increased risk for chlamydia or gonorrhea. Ask your health care provider if you are at risk.  If you are at risk of being infected with HIV, it is recommended that you take a prescription medicine daily to prevent HIV infection. This is called pre-exposure prophylaxis (PrEP). You are considered at risk if:  You are a man who has sex with other men (MSM).  You are a heterosexual man who is sexually active with multiple partners.  You take drugs by injection.  You are sexually active with a partner who has HIV.  Talk with your health care provider about  whether you are at high risk of being infected with HIV. If you choose to begin PrEP, you should first be tested for HIV. You should then be tested every 3 months for as long as you are taking PrEP.  Use sunscreen. Apply sunscreen liberally and repeatedly throughout the day. You should seek shade when your shadow is shorter than you. Protect yourself by wearing long sleeves, pants, a wide-brimmed hat, and sunglasses year round whenever you are outdoors.  Tell your health care provider of new moles or changes in moles, especially if there is a change in shape or color. Also, tell your health care provider if a mole is larger than the size of a pencil eraser.  A one-time screening for abdominal aortic aneurysm (AAA) and surgical repair of large AAAs by ultrasound is recommended for men aged 36-75 years who are current or former smokers.  Stay current with your vaccines (immunizations).   This information is not intended to replace advice given to you by your health care provider. Make sure you discuss any questions you have with your health care provider.   Document Released: 08/12/2007 Document Revised: 03/06/2014 Document Reviewed: 07/11/2010 Elsevier Interactive Patient Education [employer].

## 2016-11-01 NOTE — Telephone Encounter (Signed)
Sent to the pharmacy by e-scribe. 

## 2016-11-01 NOTE — Progress Notes (Signed)
Subjective:    Patient ID: Tommy Jackson, male    DOB: Feb 16, 1956, 61 y.o.   MRN: [id number]  HPI  Patient presents for yearly preventative medicine examination. He is a pleasant 61 year old male who  has a past medical history of Allergy; ASTHMA (09/07/2009); Herniated disc; HYPERLIPIDEMIA (02/07/2007); HYPERTENSION (11/29/2005); OBESITY (10/01/2007); Sleep apnea; and SLEEP APNEA, OBSTRUCTIVE (10/01/2007).  All immunizations and health maintenance protocols were reviewed with the patient and needed orders were placed.  Appropriate screening laboratory values were ordered for the patient including screening of hyperlipidemia, renal function and hepatic function. If indicated by BPH, a PSA was ordered.  Medication reconciliation,  past medical history, social history, problem list and allergies were reviewed in detail with the patient  Goals were established with regard to weight loss, exercise, and  diet in compliance with medications  He takes Norvasc 10 mg, lasix 20 mg, Hyzaar 100-25 mg and lopressor 100mg  for hypertension   He takes pravastatin 40 mg for hyperlipidemia   He was referred to weight loss management last month. He has received a phone call to set up an appointment but reports that the first they could get him in was 3 months from now. He has started to work on weight loss at home through diet and exercise   Wt Readings from Last 3 Encounters:  11/01/16 (!) 356 lb (161.5 kg)  10/12/16 (!) 350 lb (158.8 kg)  08/01/16 (!) 340 lb 11.2 oz (154.5 kg)   He has no acute complaints today   Review of Systems  Constitutional: Negative.   HENT: Negative.   Eyes: Negative.   Respiratory: Negative.   Cardiovascular: Negative.   Gastrointestinal: Negative.   Endocrine: Negative.   Genitourinary: Negative.   Musculoskeletal: Negative.   Skin: Negative.   Allergic/Immunologic: Negative.   Neurological: Negative.   Hematological: Negative.   Psychiatric/Behavioral: Negative.     All other systems reviewed and are negative.  Past Medical History:  Diagnosis Date  . Allergy   . ASTHMA 09/07/2009  . Herniated disc   . HYPERLIPIDEMIA 02/07/2007  . HYPERTENSION 11/29/2005  . OBESITY 10/01/2007  . Sleep apnea    cpap  . SLEEP APNEA, OBSTRUCTIVE 10/01/2007    Social History   Social History  . Marital status: Single    Spouse name: N/A  . Number of children: 3  . Years of education: N/A   Occupational History  . 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 Smoking status: Never Smoker  . Smokeless tobacco: Never Used  . Alcohol use 0.0 oz/week     Comment: rare alcohol intake  . Drug use: No  . Sexual activity: Not on file   Other Topics Concern  . Not on file   Social History Narrative  . No narrative on file    Past Surgical History:  Procedure Laterality Date  . bicep surgery Right 2014  . ELBOW SURGERY Right 2013  . removal disc neck  2015  . SHOULDER SURGERY Right 1990    Family History  Problem Relation Age of Onset  . Colon cancer Father 70  . Lung cancer Father   . CAD Father   . Diabetes Father   . Heart attack Father   . Colon cancer Brother 7  . Heart attack Paternal Grandfather   . CAD Brother     Allergies  Allergen Reactions  . Aspirin     REACTION: unspecified  . Glycerin Nausea  Only  . Penicillins     REACTION: family hx    Current Outpatient Prescriptions on File Prior to Visit  Medication Sig Dispense Refill  . albuterol (VENTOLIN HFA) 108 (90 Base) MCG/ACT inhaler Inhale 2 puffs into the lungs every 4 (four) hours as needed. 1 Inhaler 3  . amLODipine (NORVASC) 10 MG tablet Take 1 tablet (10 mg total) by mouth daily. 90 tablet 2  . Ascorbic Acid (VITAMIN C) 1000 MG tablet Take 1,000 mg by mouth daily.    Marland Kitchen azelastine (ASTELIN) 0.1 % nasal spray Place 2 sprays into both nostrils 2 (two) times daily. Use in each nostril as directed 30 mL 5  . Cetirizine HCl (ZYRTEC PO) Take  by mouth daily.    . fluticasone (FLONASE) 50 MCG/ACT nasal spray Place 1 spray into both nostrils 2 (two) times daily. 16 g 2  . furosemide (LASIX) 20 MG tablet Take 1 tablet (20 mg total) by mouth daily. 90 tablet 3  . HYDROcodone-acetaminophen (VICODIN ES) 7.5-750 MG per tablet Take 1 tablet by mouth every 6 (six) hours as needed. Patient used this medication for pain in his arm.    Marland Kitchen LIDODERM 5 %   0  . losartan-hydrochlorothiazide (HYZAAR) 100-25 MG tablet Take 1 tablet by mouth daily. 90 tablet 3  . meloxicam (MOBIC) 15 MG tablet Take 15 mg by mouth daily.    . metoprolol (LOPRESSOR) 100 MG tablet Take 1 tablet (100 mg total) by mouth 2 (two) times daily. 180 tablet 2  . pravastatin (PRAVACHOL) 40 MG tablet TAKE 1 TABLET (40 MG TOTAL) BY MOUTH DAILY. 90 tablet 1  . Multiple Vitamin (MULTIVITAMIN) capsule Take 1 capsule by mouth daily.      . potassium chloride (K-DUR) 10 MEQ tablet Take 1 tablet (10 mEq total) by mouth daily. (Patient not taking: Reported on 11/01/2016) 90 tablet 1   No current facility-administered medications on file prior to visit.     BP (!) 156/100 (BP Location: Left Arm)   Temp 98.5 F (36.9 C) (Oral)   Ht 6\' 1"  (1.854 m)   Wt (!) 356 lb (161.5 kg)   BMI 46.97 kg/m       Objective:   Physical Exam  Constitutional: He is oriented to person, place, and time. He appears well-developed and well-nourished. No distress.  Morbidly obese    HENT:  Head: Normocephalic and atraumatic.  Right Ear: External ear normal.  Left Ear: External ear normal.  Nose: Nose normal.  Mouth/Throat: Oropharynx is clear and moist. No oropharyngeal exudate.  Eyes: Pupils are equal, round, and reactive to light. Conjunctivae and EOM are normal. Right eye exhibits no discharge. Left eye exhibits no discharge. No scleral icterus.  Neck: Normal range of motion. Neck supple. No JVD present. No tracheal deviation present. No thyromegaly present.  Cardiovascular: Normal rate, regular  rhythm, normal heart sounds and intact distal pulses.  Exam reveals no gallop and no friction rub.   No murmur heard. Pulmonary/Chest: Effort normal and breath sounds normal. No stridor. No respiratory distress. He has no wheezes. He has no rales. He exhibits no tenderness.  Abdominal: Soft. Bowel sounds are normal. He exhibits no distension and no mass. There is no tenderness. There is no rebound and no guarding.  Genitourinary: Prostate normal.  Musculoskeletal: Normal range of motion. He exhibits no edema, tenderness or deformity.  Lymphadenopathy:    He has no cervical adenopathy.  Neurological: He is alert and oriented to person, place, and time. He  has normal reflexes. He displays normal reflexes. No cranial nerve deficit. He exhibits normal muscle tone. Coordination normal.  Skin: Skin is warm and dry. No rash noted. He is not diaphoretic. No erythema. No pallor.  Psychiatric: He has a normal mood and affect. His behavior is normal. Judgment and thought content normal.  Nursing note and vitals reviewed.      Assessment & Plan:  1. Routine general medical examination at a health care facility - Follow up in one year for CPE  - Follow up sooner if needed - Basic metabolic panel - CBC with Differential/Platelet - Hemoglobin A1c - Hepatic function panel - Lipid panel - PSA - TSH - POCT Urinalysis Dipstick (Automated)  2. Essential hypertension - Near goal today. No change in medications  - Basic metabolic panel - CBC with Differential/Platelet - Hemoglobin A1c - Hepatic function panel - Lipid panel - PSA - TSH - POCT Urinalysis Dipstick (Automated)  3. Prediabetes  - Basic metabolic panel - CBC with Differential/Platelet - Hemoglobin A1c - Hepatic function panel - Lipid panel - PSA - TSH - POCT Urinalysis Dipstick (Automated)  4. Obesity with serious comorbidity, unspecified classification, unspecified obesity type - Encouraged to continue with weight loss  management  - Start walking nightly  - Heart healthy diet   Dorothyann Peng, NP

## 2016-11-07 ENCOUNTER — Ambulatory Visit (INDEPENDENT_AMBULATORY_CARE_PROVIDER_SITE_OTHER): Payer: Medicare HMO | Admitting: Adult Health

## 2016-11-07 ENCOUNTER — Encounter: Payer: Self-pay | Admitting: Adult Health

## 2016-11-07 VITALS — BP 134/84 | HR 70 | Temp 98.2°F | Resp 20 | Ht 73.0 in | Wt 355.0 lb

## 2016-11-07 DIAGNOSIS — T148XXA Other injury of unspecified body region, initial encounter: Secondary | ICD-10-CM | POA: Diagnosis not present

## 2016-11-07 MED ORDER — TIZANIDINE HCL 4 MG PO CAPS: 4.0000 mg | ORAL_CAPSULE | Freq: Three times a day (TID) | ORAL | 1 refills | Status: DC | PRN

## 2016-11-07 NOTE — Progress Notes (Signed)
Subjective:    Patient ID: Tommy Jackson, male    DOB: 1955-11-04, 61 y.o.   MRN: [id number]  HPI  61 year old male who  has a past medical history of Allergy; ASTHMA (09/07/2009); Herniated disc; HYPERLIPIDEMIA (02/07/2007); HYPERTENSION (11/29/2005); OBESITY (10/01/2007); Sleep apnea; and SLEEP APNEA, OBSTRUCTIVE (10/01/2007).  He presents to the office today for the acute complaint of left sided flank pain. He reports that that he first developed the pain about one week ago. He reports pain after " a violent sneeze". Pain is worse with bending, twisting, and laughing . He has been using icy hot patches without resolution. He denies any trauma   Review of Systems  Constitutional: Negative.   HENT: Negative.   Respiratory: Negative.   Cardiovascular: Negative.   Musculoskeletal: Positive for back pain and myalgias.  All other systems reviewed and are negative.  Past Medical History:  Diagnosis Date  . Allergy   . ASTHMA 09/07/2009  . Herniated disc   . HYPERLIPIDEMIA 02/07/2007  . HYPERTENSION 11/29/2005  . OBESITY 10/01/2007  . Sleep apnea    cpap  . SLEEP APNEA, OBSTRUCTIVE 10/01/2007    Social History   Social History  . Marital status: Single    Spouse name: N/A  . Number of children: 3  . Years of education: N/A   Occupational History  . 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 Smoking status: Never Smoker  . Smokeless tobacco: Never Used  . Alcohol use 0.0 oz/week     Comment: rare alcohol intake  . Drug use: No  . Sexual activity: Not on file   Other Topics Concern  . Not on file   Social History Narrative  . No narrative on file    Past Surgical History:  Procedure Laterality Date  . bicep surgery Right 2014  . ELBOW SURGERY Right 2013  . removal disc neck  2015  . SHOULDER SURGERY Right 1990    Family History  Problem Relation Age of Onset  . Colon cancer Father 63  . Lung cancer Father   . CAD Father   .  Diabetes Father   . Heart attack Father   . Colon cancer Brother 20  . Heart attack Paternal Grandfather   . CAD Brother     Allergies  Allergen Reactions  . Aspirin     REACTION: unspecified  . Glycerin Nausea Only  . Penicillins     REACTION: family hx    Current Outpatient Prescriptions on File Prior to Visit  Medication Sig Dispense Refill  . albuterol (VENTOLIN HFA) 108 (90 Base) MCG/ACT inhaler Inhale 2 puffs into the lungs every 4 (four) hours as needed. 1 Inhaler 3  . amLODipine (NORVASC) 10 MG tablet Take 1 tablet (10 mg total) by mouth daily. 90 tablet 2  . Ascorbic Acid (VITAMIN C) 1000 MG tablet Take 1,000 mg by mouth daily.    Marland Kitchen azelastine (ASTELIN) 0.1 % nasal spray Place 2 sprays into both nostrils 2 (two) times daily. Use in each nostril as directed 30 mL 5  . Cetirizine HCl (ZYRTEC PO) Take by mouth daily.    . fluticasone (FLONASE) 50 MCG/ACT nasal spray Place 1 spray into both nostrils 2 (two) times daily. 16 g 2  . furosemide (LASIX) 20 MG tablet Take 1 tablet (20 mg total) by mouth daily. 90 tablet 3  . HYDROcodone-acetaminophen (VICODIN ES) 7.5-750 MG per tablet Take 1 tablet by mouth every  6 (six) hours as needed. Patient used this medication for pain in his arm.    Marland Kitchen LIDODERM 5 %   0  . losartan-hydrochlorothiazide (HYZAAR) 100-25 MG tablet Take 1 tablet by mouth daily. 90 tablet 3  . meloxicam (MOBIC) 15 MG tablet Take 15 mg by mouth daily.    . metoprolol tartrate (LOPRESSOR) 100 MG tablet TAKE 1 TABLET (100 MG TOTAL) BY MOUTH 2 (TWO) TIMES DAILY. 180 tablet 3  . Multiple Vitamin (MULTIVITAMIN) capsule Take 1 capsule by mouth daily.      . potassium chloride (K-DUR) 10 MEQ tablet Take 1 tablet (10 mEq total) by mouth daily. 90 tablet 1  . pravastatin (PRAVACHOL) 40 MG tablet TAKE 1 TABLET (40 MG TOTAL) BY MOUTH DAILY. 90 tablet 1   No current facility-administered medications on file prior to visit.     BP 134/84   Pulse 70   Temp 98.2 F (36.8 C)    Resp 20   Ht 6\' 1"  (1.854 m)   Wt (!) 355 lb (161 kg)   SpO2 96%   BMI 46.84 kg/m       Objective:   Physical Exam  Constitutional: He is oriented to person, place, and time. He appears well-developed and well-nourished. No distress.  Cardiovascular: Normal rate, regular rhythm, normal heart sounds and intact distal pulses.  Exam reveals no gallop and no friction rub.   No murmur heard. Pulmonary/Chest: Effort normal and breath sounds normal. No respiratory distress. He has no wheezes. He has no rales. He exhibits no tenderness.  Musculoskeletal: He exhibits tenderness (tenderness along left latissimus dosi. No bruising noted ). He exhibits no edema or deformity.  Neurological: He is alert and oriented to person, place, and time.  Skin: Skin is warm and dry. No rash noted. He is not diaphoretic. No erythema. No pallor.  Psychiatric: He has a normal mood and affect. His behavior is normal. Judgment and thought content normal.  Nursing note and vitals reviewed.     Assessment & Plan:  1. Muscle strain - tiZANidine (ZANAFLEX) 4 MG capsule; Take 1 capsule (4 mg total) by mouth 3 (three) times daily as needed for muscle spasms.  Dispense: 30 capsule; Refill: 1 - Advised motrin 600 mg Q8H PRN and heating pad.  - Follow up if no improvement   Dorothyann Peng, NP

## 2016-11-09 ENCOUNTER — Telehealth: Payer: Self-pay | Admitting: Adult Health

## 2016-11-09 NOTE — Telephone Encounter (Signed)
Pt is calling needing Rx tizanidine (ZANAFLEX) need it to be changed to tablets b/c his insurance do not pt for capsules.  Pharm: [facility] in [employer]

## 2016-11-09 NOTE — Telephone Encounter (Signed)
I called and spoke to Pharmacist. Advised change from capsules to tablets ok. She voiced understanding.

## 2016-11-17 ENCOUNTER — Encounter: Payer: Self-pay | Admitting: Adult Health

## 2016-11-23 ENCOUNTER — Other Ambulatory Visit: Payer: Self-pay | Admitting: Adult Health

## 2016-11-23 DIAGNOSIS — R600 Localized edema: Secondary | ICD-10-CM

## 2016-11-24 NOTE — Telephone Encounter (Signed)
Sent to the pharmacy by e-scribe. 

## 2016-12-23 ENCOUNTER — Other Ambulatory Visit: Payer: Self-pay | Admitting: Adult Health

## 2016-12-26 NOTE — Telephone Encounter (Signed)
Sent to the pharmacy by e-scribe. 

## 2017-01-24 DIAGNOSIS — G4733 Obstructive sleep apnea (adult) (pediatric): Secondary | ICD-10-CM | POA: Diagnosis not present

## 2017-04-04 DIAGNOSIS — M545 Low back pain: Secondary | ICD-10-CM | POA: Diagnosis not present

## 2017-04-04 DIAGNOSIS — Z79899 Other long term (current) drug therapy: Secondary | ICD-10-CM | POA: Diagnosis not present

## 2017-04-04 DIAGNOSIS — M533 Sacrococcygeal disorders, not elsewhere classified: Secondary | ICD-10-CM | POA: Diagnosis not present

## 2017-04-29 ENCOUNTER — Other Ambulatory Visit: Payer: Self-pay | Admitting: Adult Health

## 2017-05-02 ENCOUNTER — Ambulatory Visit (INDEPENDENT_AMBULATORY_CARE_PROVIDER_SITE_OTHER): Payer: Medicare HMO | Admitting: Adult Health

## 2017-05-02 ENCOUNTER — Encounter: Payer: Self-pay | Admitting: Adult Health

## 2017-05-02 VITALS — BP 140/92 | Temp 98.3°F | Wt 331.0 lb

## 2017-05-02 DIAGNOSIS — Z6841 Body Mass Index (BMI) 40.0 and over, adult: Secondary | ICD-10-CM | POA: Diagnosis not present

## 2017-05-02 DIAGNOSIS — R7303 Prediabetes: Secondary | ICD-10-CM

## 2017-05-02 LAB — POCT GLYCOSYLATED HEMOGLOBIN (HGB A1C): Hemoglobin A1C: 6.3

## 2017-05-02 NOTE — Progress Notes (Signed)
Subjective:    Patient ID: Tommy Jackson, male    DOB: March 17, 1955, 62 y.o.   MRN: [id number]  HPI  62 year old male who  has a past medical history of Allergy, ASTHMA (09/07/2009), Herniated disc, HYPERLIPIDEMIA (02/07/2007), HYPERTENSION (11/29/2005), OBESITY (10/01/2007), Sleep apnea, and SLEEP APNEA, OBSTRUCTIVE (10/01/2007).  He presents to the office today for for follow up regarding elevated glucose. During his physical 6 months ago his A1c was 6.4. He was advised to start exercising and working on diet. Today in the office he reports that he is working on his diet, eating small portions and healthier options. He is no longer snacking. He has not started exercising but plans to start once he loses a few more pounds.   Wt Readings from Last 3 Encounters:  05/02/17 (!) 331 lb (150.1 kg)  11/07/16 (!) 355 lb (161 kg)  11/01/16 (!) 356 lb (161.5 kg)   Overall he states " I am feeling really good"   Review of Systems See HPI   Past Medical History:  Diagnosis Date  . Allergy   . ASTHMA 09/07/2009  . Herniated disc   . HYPERLIPIDEMIA 02/07/2007  . HYPERTENSION 11/29/2005  . OBESITY 10/01/2007  . Sleep apnea    cpap  . SLEEP APNEA, OBSTRUCTIVE 10/01/2007    Social History   Socioeconomic History  . Marital status: Single    Spouse name: Not on file  . Number of children: 3  . Years of education: Not on file  . Highest education level: Not on file  Social Needs  . Financial resource strain: Not on file  . Food insecurity - worry: Not on file  . Food insecurity - inability: Not on file  . Transportation needs - medical: Not on file  . Transportation needs - non-medical: Not on file  Occupational History  . Occupation: retired Facilities manager term disability    Employer: RETIRED  Tobacco Use  . Smoking status: Never Smoker  . Smokeless tobacco: Never Used  Substance and Sexual Activity  . Alcohol use: Yes    Alcohol/week: 0.0 oz    Comment: rare alcohol intake  . Drug  use: No  . Sexual activity: Not on file  Other Topics Concern  . Not on file  Social History Narrative  . Not on file    Past Surgical History:  Procedure Laterality Date  . bicep surgery Right 2014  . ELBOW SURGERY Right 2013  . removal disc neck  2015  . SHOULDER SURGERY Right 1990    Family History  Problem Relation Age of Onset  . Colon cancer Father 62  . Lung cancer Father   . CAD Father   . Diabetes Father   . Heart attack Father   . Colon cancer Brother 36  . Heart attack Paternal Grandfather   . CAD Brother     Allergies  Allergen Reactions  . Aspirin     REACTION: unspecified  . Glycerin Nausea Only  . Penicillins     REACTION: family hx    Current Outpatient Medications on File Prior to Visit  Medication Sig Dispense Refill  . albuterol (VENTOLIN HFA) 108 (90 Base) MCG/ACT inhaler Inhale 2 puffs into the lungs every 4 (four) hours as needed. 1 Inhaler 3  . amLODipine (NORVASC) 10 MG tablet TAKE 1 TABLET (10 MG TOTAL) BY MOUTH DAILY. 90 tablet 2  . Ascorbic Acid (VITAMIN C) 1000 MG tablet Take 1,000 mg by mouth daily.    Marland Kitchen  azelastine (ASTELIN) 0.1 % nasal spray Place 2 sprays into both nostrils 2 (two) times daily. Use in each nostril as directed 30 mL 5  . Cetirizine HCl (ZYRTEC PO) Take by mouth daily.    . fluticasone (FLONASE) 50 MCG/ACT nasal spray Place 1 spray into both nostrils 2 (two) times daily. 16 g 2  . furosemide (LASIX) 20 MG tablet Take 1 tablet (20 mg total) by mouth daily. 90 tablet 3  . KLOR-CON M10 10 MEQ tablet TAKE 1 TABLET BY MOUTH ONCE DAILY 90 tablet 3  . LIDODERM 5 %   0  . losartan-hydrochlorothiazide (HYZAAR) 100-25 MG tablet Take 1 tablet by mouth daily. 90 tablet 3  . meloxicam (MOBIC) 15 MG tablet Take 15 mg by mouth daily.    . metoprolol tartrate (LOPRESSOR) 100 MG tablet TAKE 1 TABLET (100 MG TOTAL) BY MOUTH 2 (TWO) TIMES DAILY. 180 tablet 3  . Multiple Vitamin (MULTIVITAMIN) capsule Take 1 capsule by mouth daily.      .  pravastatin (PRAVACHOL) 40 MG tablet TAKE 1 TABLET EVERY DAY 90 tablet 1   No current facility-administered medications on file prior to visit.     BP (!) 140/92 (BP Location: Left Arm)   Temp 98.3 F (36.8 C) (Oral)   Wt (!) 331 lb (150.1 kg)   BMI 43.67 kg/m       Objective:   Physical Exam  Constitutional: He is oriented to person, place, and time. He appears well-developed and well-nourished. No distress.  Obese   Cardiovascular: Normal rate, regular rhythm, normal heart sounds and intact distal pulses. Exam reveals no gallop and no friction rub.  No murmur heard. Pulmonary/Chest: Effort normal and breath sounds normal. No respiratory distress. He has no wheezes. He has no rales. He exhibits no tenderness.  Neurological: He is alert and oriented to person, place, and time.  Skin: Skin is warm and dry. No rash noted. He is not diaphoretic. No erythema. No pallor.  Psychiatric: He has a normal mood and affect. His behavior is normal. Judgment and thought content normal.  Nursing note and vitals reviewed.     Assessment & Plan:  1. Prediabetes - POCT A1C- 6.3  - He is doing well with his diet. His weight is down about 24 pounds since September. Congratulated and encouraged to continue  - Follow up in 6 months at his [place], NP

## 2017-05-02 NOTE — Progress Notes (Signed)
Subjective:    Patient ID: Tommy Jackson, male    DOB: 25-Dec-1955, 62 y.o.   MRN: [id number]  HPI  He is doing well overall.  He has made significant changes to his lifestyle with his diet.  He is eating smaller portions, making healthier portions and is limiting his snacking. His blood pressure is elevated today.  He did not take his BP meds until an hour ago.  He does have a BP cuff at home but does not routinely check his blood pressure.  His weight is down 25 pounds since September 2018.  He is commended for this.  He is feeling better and his mobility has improved.  He is not having to use Vicodin for back pain as often since he has lost weight.  He is using lidocaine patches. He is not currently exercising but is staying active and plans on starting a walking routine shortly.   Review of Systems  Constitutional: Negative.   HENT: Negative.   Respiratory: Negative for chest tightness, shortness of breath and wheezing.   Cardiovascular: Negative for chest pain, palpitations and leg swelling.  Gastrointestinal: Negative for abdominal pain, constipation, diarrhea, nausea and vomiting.  Endocrine: Negative for polydipsia, polyphagia and polyuria.  Genitourinary: Negative.   Musculoskeletal: Positive for back pain.  Neurological: Negative.   Psychiatric/Behavioral: Negative.    Past Medical History:  Diagnosis Date  . Allergy   . ASTHMA 09/07/2009  . Herniated disc   . HYPERLIPIDEMIA 02/07/2007  . HYPERTENSION 11/29/2005  . OBESITY 10/01/2007  . Sleep apnea    cpap  . SLEEP APNEA, OBSTRUCTIVE 10/01/2007    Social History   Socioeconomic History  . Marital status: Single    Spouse name: Not on file  . Number of children: 3  . Years of education: Not on file  . Highest education level: Not on file  Social Needs  . Financial resource strain: Not on file  . Food insecurity - worry: Not on file  . Food insecurity - inability: Not on file  . Transportation needs - medical: Not on  file  . Transportation needs - non-medical: Not on file  Occupational History  . Occupation: retired Facilities manager term disability    Employer: RETIRED  Tobacco Use  . Smoking status: Never Smoker  . Smokeless tobacco: Never Used  Substance and Sexual Activity  . Alcohol use: Yes    Alcohol/week: 0.0 oz    Comment: rare alcohol intake  . Drug use: No  . Sexual activity: Not on file  Other Topics Concern  . Not on file  Social History Narrative  . Not on file    Past Surgical History:  Procedure Laterality Date  . bicep surgery Right 2014  . ELBOW SURGERY Right 2013  . removal disc neck  2015  . SHOULDER SURGERY Right 1990    Family History  Problem Relation Age of Onset  . Colon cancer Father 19  . Lung cancer Father   . CAD Father   . Diabetes Father   . Heart attack Father   . Colon cancer Brother 49  . Heart attack Paternal Grandfather   . CAD Brother     Allergies  Allergen Reactions  . Aspirin     REACTION: unspecified  . Glycerin Nausea Only  . Penicillins     REACTION: family hx    Current Outpatient Medications on File Prior to Visit  Medication Sig Dispense Refill  . albuterol (VENTOLIN HFA) 108 (90 Base) MCG/ACT  inhaler Inhale 2 puffs into the lungs every 4 (four) hours as needed. 1 Inhaler 3  . amLODipine (NORVASC) 10 MG tablet TAKE 1 TABLET (10 MG TOTAL) BY MOUTH DAILY. 90 tablet 2  . Ascorbic Acid (VITAMIN C) 1000 MG tablet Take 1,000 mg by mouth daily.    Marland Kitchen azelastine (ASTELIN) 0.1 % nasal spray Place 2 sprays into both nostrils 2 (two) times daily. Use in each nostril as directed 30 mL 5  . Cetirizine HCl (ZYRTEC PO) Take by mouth daily.    . fluticasone (FLONASE) 50 MCG/ACT nasal spray Place 1 spray into both nostrils 2 (two) times daily. 16 g 2  . furosemide (LASIX) 20 MG tablet Take 1 tablet (20 mg total) by mouth daily. 90 tablet 3  . KLOR-CON M10 10 MEQ tablet TAKE 1 TABLET BY MOUTH ONCE DAILY 90 tablet 3  . LIDODERM 5 %   0  .  losartan-hydrochlorothiazide (HYZAAR) 100-25 MG tablet Take 1 tablet by mouth daily. 90 tablet 3  . meloxicam (MOBIC) 15 MG tablet Take 15 mg by mouth daily.    . metoprolol tartrate (LOPRESSOR) 100 MG tablet TAKE 1 TABLET (100 MG TOTAL) BY MOUTH 2 (TWO) TIMES DAILY. 180 tablet 3  . Multiple Vitamin (MULTIVITAMIN) capsule Take 1 capsule by mouth daily.      . pravastatin (PRAVACHOL) 40 MG tablet TAKE 1 TABLET EVERY DAY 90 tablet 1   No current facility-administered medications on file prior to visit.     BP (!) 140/92 (BP Location: Left Arm)   Temp 98.3 F (36.8 C) (Oral)   Wt (!) 331 lb (150.1 kg)   BMI 43.67 kg/m      Objective:   Physical Exam  Constitutional: He appears well-developed and well-nourished. No distress.  Cardiovascular: Normal rate, regular rhythm, normal heart sounds and intact distal pulses.  Pulmonary/Chest: Effort normal and breath sounds normal. No respiratory distress. He has no wheezes. He has no rales.  Musculoskeletal: He exhibits no edema.  Skin: Skin is warm and dry. He is not diaphoretic.  Nursing note and vitals reviewed.     Assessment & Plan:  1. Prediabetes Continue with diet and exercise.  He is commended for this. Blood pressure is elevated today.  Will continue to monitor and make changes if it remains elevated. Follow-up in 6 months.  - POCT H7D  Tommy Jackson

## 2017-05-08 ENCOUNTER — Telehealth: Payer: Self-pay | Admitting: Adult Health

## 2017-05-08 DIAGNOSIS — J309 Allergic rhinitis, unspecified: Secondary | ICD-10-CM

## 2017-05-08 NOTE — Telephone Encounter (Signed)
Pt is requesting azelastine nasal spray and fluticasone nasal spray  Pharm: [facility] on [address] [employer]

## 2017-05-09 DIAGNOSIS — Z79899 Other long term (current) drug therapy: Secondary | ICD-10-CM | POA: Diagnosis not present

## 2017-05-09 DIAGNOSIS — M545 Low back pain: Secondary | ICD-10-CM | POA: Diagnosis not present

## 2017-05-14 ENCOUNTER — Other Ambulatory Visit: Payer: Self-pay | Admitting: Adult Health

## 2017-05-14 DIAGNOSIS — R600 Localized edema: Secondary | ICD-10-CM

## 2017-05-15 NOTE — Telephone Encounter (Signed)
Cory, I do not see that you have prescribed these medications in the past.  Please advise. 

## 2017-05-16 MED ORDER — FLUTICASONE PROPIONATE 50 MCG/ACT NA SUSP: 1.0000 | Freq: Two times a day (BID) | NASAL | 3 refills | Status: DC

## 2017-05-16 MED ORDER — AZELASTINE HCL 0.1 % NA SOLN: 2.0000 | Freq: Two times a day (BID) | NASAL | 3 refills | Status: DC

## 2017-05-16 NOTE — Telephone Encounter (Signed)
Sent to the pharmacy by e-scribe. 

## 2017-05-16 NOTE — Telephone Encounter (Signed)
Ok to refill for 1 year 

## 2017-09-23 ENCOUNTER — Other Ambulatory Visit: Payer: Self-pay | Admitting: Adult Health

## 2017-09-25 NOTE — Telephone Encounter (Signed)
Sent to the pharmacy by e-scribe. 

## 2017-10-02 ENCOUNTER — Encounter: Payer: Self-pay | Admitting: Adult Health

## 2017-10-02 ENCOUNTER — Ambulatory Visit (INDEPENDENT_AMBULATORY_CARE_PROVIDER_SITE_OTHER): Payer: Medicare HMO | Admitting: Adult Health

## 2017-10-02 ENCOUNTER — Ambulatory Visit (INDEPENDENT_AMBULATORY_CARE_PROVIDER_SITE_OTHER): Payer: Medicare HMO

## 2017-10-02 VITALS — BP 150/90 | Temp 98.2°F | Wt 334.0 lb

## 2017-10-02 DIAGNOSIS — M546 Pain in thoracic spine: Secondary | ICD-10-CM | POA: Diagnosis not present

## 2017-10-02 DIAGNOSIS — M545 Low back pain, unspecified: Secondary | ICD-10-CM

## 2017-10-02 DIAGNOSIS — M542 Cervicalgia: Secondary | ICD-10-CM

## 2017-10-02 DIAGNOSIS — M25511 Pain in right shoulder: Secondary | ICD-10-CM

## 2017-10-02 DIAGNOSIS — M5031 Other cervical disc degeneration,  high cervical region: Secondary | ICD-10-CM | POA: Diagnosis not present

## 2017-10-02 DIAGNOSIS — M19011 Primary osteoarthritis, right shoulder: Secondary | ICD-10-CM | POA: Diagnosis not present

## 2017-10-02 MED ORDER — BACLOFEN 10 MG PO TABS: 10.0000 mg | ORAL_TABLET | Freq: Two times a day (BID) | ORAL | 0 refills | Status: AC

## 2017-10-02 NOTE — Progress Notes (Signed)
Subjective:    Patient ID: Tommy Jackson, male    DOB: 18-Sep-1955, 62 y.o.   MRN: [id number]  HPI 62 year old male who  has a past medical history of Allergy, ASTHMA (09/07/2009), Herniated disc, HYPERLIPIDEMIA (02/07/2007), HYPERTENSION (11/29/2005), OBESITY (10/01/2007), Sleep apnea, and SLEEP APNEA, OBSTRUCTIVE (10/01/2007).  He presents to the office today for an acute issue of shoulder, neck, and low back pain. His symptoms have been present for 2-3 weeks. He has been using Ibuprofen 600 mg and lidocaine patches she reports "takes the edge off".  Exacerbating factors-his wife reports include that of helping a family member move.  Associated symptoms include decreased strength weakness in right hand and hand/arm tingling.  As for the low back pain pain is located in right lower, is worse with bending and twisting motions.  This pain is felt as a dull ache.  His symptoms started approximately the same time as the neck and shoulder pain.  He does have history of cervical spine surgery in 2015.  He feels as though with more muscular in nature than anything  Review of Systems See HPI   Past Medical History:  Diagnosis Date  . Allergy   . ASTHMA 09/07/2009  . Herniated disc   . HYPERLIPIDEMIA 02/07/2007  . HYPERTENSION 11/29/2005  . OBESITY 10/01/2007  . Sleep apnea    cpap  . SLEEP APNEA, OBSTRUCTIVE 10/01/2007    Social History   Socioeconomic History  . Marital status: Single    Spouse name: Not on file  . Number of children: 3  . Years of education: Not on file  . Highest education level: Not on file  Occupational History  . Occupation: retired Facilities manager term disability    Employer: RETIRED  Social Needs  . Financial resource strain: Not on file  . Food insecurity:    Worry: Not on file    Inability: Not on file  . Transportation needs:    Medical: Not on file    Non-medical: Not on file  Tobacco Use  . Smoking status: Never Smoker  . Smokeless tobacco: Never Used    Substance and Sexual Activity  . Alcohol use: Yes    Alcohol/week: 0.0 oz    Comment: rare alcohol intake  . Drug use: No  . Sexual activity: Not on file  Lifestyle  . Physical activity:    Days per week: Not on file    Minutes per session: Not on file  . Stress: Not on file  Relationships  . Social connections:    Talks on phone: Not on file    Gets together: Not on file    Attends religious service: Not on file    Active member of club or organization: Not on file    Attends meetings of clubs or organizations: Not on file    Relationship status: Not on file  . Intimate partner violence:    Fear of current or ex partner: Not on file    Emotionally abused: Not on file    Physically abused: Not on file    Forced sexual activity: Not on file  Other Topics Concern  . Not on file  Social History Narrative  . Not on file    Past Surgical History:  Procedure Laterality Date  . bicep surgery Right 2014  . ELBOW SURGERY Right 2013  . removal disc neck  2015  . SHOULDER SURGERY Right 1990    Family History  Problem Relation Age of Onset  .  Colon cancer Father 57  . Lung cancer Father   . CAD Father   . Diabetes Father   . Heart attack Father   . Colon cancer Brother 74  . Heart attack Paternal Grandfather   . CAD Brother     Allergies  Allergen Reactions  . Aspirin     REACTION: unspecified  . Glycerin Nausea Only  . Penicillins     REACTION: family hx    Current Outpatient Medications on File Prior to Visit  Medication Sig Dispense Refill  . albuterol (VENTOLIN HFA) 108 (90 Base) MCG/ACT inhaler Inhale 2 puffs into the lungs every 4 (four) hours as needed. 1 Inhaler 3  . amLODipine (NORVASC) 10 MG tablet TAKE 1 TABLET (10 MG TOTAL) BY MOUTH DAILY. 90 tablet 2  . Ascorbic Acid (VITAMIN C) 1000 MG tablet Take 1,000 mg by mouth daily.    Marland Kitchen azelastine (ASTELIN) 0.1 % nasal spray Place 2 sprays into both nostrils 2 (two) times daily. Use in each nostril as  directed 90 mL 3  . Cetirizine HCl (ZYRTEC PO) Take by mouth daily.    . fluticasone (FLONASE) 50 MCG/ACT nasal spray Place 1 spray into both nostrils 2 (two) times daily. 48 g 3  . furosemide (LASIX) 20 MG tablet TAKE 1 TABLET BY MOUTH ONCE DAILY 90 tablet 1  . KLOR-CON M10 10 MEQ tablet TAKE 1 TABLET BY MOUTH ONCE DAILY 90 tablet 3  . lidocaine (LIDODERM) 5 % Apply 1-3 patches to painful area for up to 12 hours per day    . LIDODERM 5 %   0  . losartan-hydrochlorothiazide (HYZAAR) 100-25 MG tablet TAKE 1 TABLET EVERY DAY 90 tablet 0  . meloxicam (MOBIC) 15 MG tablet Take 15 mg by mouth daily.    . metoprolol tartrate (LOPRESSOR) 100 MG tablet TAKE 1 TABLET (100 MG TOTAL) BY MOUTH 2 (TWO) TIMES DAILY. 180 tablet 3  . Multiple Vitamin (MULTIVITAMIN) capsule Take 1 capsule by mouth daily.      . pravastatin (PRAVACHOL) 40 MG tablet TAKE 1 TABLET EVERY DAY 90 tablet 1   No current facility-administered medications on file prior to visit.     BP (!) 150/90   Temp 98.2 F (36.8 C) (Oral)   Wt (!) 334 lb (151.5 kg)   BMI 44.07 kg/m       Objective:   Physical Exam  Constitutional: He is oriented to person, place, and time. He appears well-developed and well-nourished. No distress.  Cardiovascular: Normal rate, regular rhythm, normal heart sounds and intact distal pulses. Exam reveals no gallop and no friction rub.  No murmur heard. Pulmonary/Chest: Effort normal and breath sounds normal. No stridor. No respiratory distress. He has no wheezes. He has no rales. He exhibits no tenderness.  Musculoskeletal: Normal range of motion. He exhibits tenderness. He exhibits no edema or deformity.  Spinal tenderness at C7-C8 No pain with palpation along right shoulder or right arm With palpation to right lower back, muscle spasm felt  Neurological: He is alert and oriented to person, place, and time. He displays normal reflexes. No cranial nerve deficit or sensory deficit. He exhibits normal muscle  tone. Coordination normal.  5 out of 5 grip strength in left hand 4 out of 5 grip strength in right hand  Skin: Skin is warm and dry. He is not diaphoretic.  Nursing note and vitals reviewed.     Assessment & Plan:  We will get x-rays of cervical spine and right shoulder  in the office today.  Likely this is muscular from heavy lifting.  Will prescribe baclofen to take nightly for the next 5 days.  Low back pain appears to be muscular in nature.  Again will prescribe muscle relaxer for this.  He was advised to follow-up in 2 to 3 days if no improvement in at that time will likely have to refer to his neurosurgeon. He is ok with this plan   Dorothyann Peng, NP

## 2017-10-03 ENCOUNTER — Encounter: Payer: Self-pay | Admitting: *Deleted

## 2017-10-28 ENCOUNTER — Other Ambulatory Visit: Payer: Self-pay | Admitting: Adult Health

## 2017-11-02 ENCOUNTER — Encounter: Payer: Self-pay | Admitting: Adult Health

## 2017-11-02 ENCOUNTER — Ambulatory Visit (INDEPENDENT_AMBULATORY_CARE_PROVIDER_SITE_OTHER): Payer: Medicare HMO | Admitting: Adult Health

## 2017-11-02 VITALS — BP 180/108 | Temp 98.3°F | Ht 72.5 in | Wt 340.0 lb

## 2017-11-02 DIAGNOSIS — I10 Essential (primary) hypertension: Secondary | ICD-10-CM | POA: Diagnosis not present

## 2017-11-02 DIAGNOSIS — E669 Obesity, unspecified: Secondary | ICD-10-CM

## 2017-11-02 DIAGNOSIS — E785 Hyperlipidemia, unspecified: Secondary | ICD-10-CM

## 2017-11-02 DIAGNOSIS — Z114 Encounter for screening for human immunodeficiency virus [HIV]: Secondary | ICD-10-CM

## 2017-11-02 DIAGNOSIS — R7303 Prediabetes: Secondary | ICD-10-CM

## 2017-11-02 DIAGNOSIS — Z125 Encounter for screening for malignant neoplasm of prostate: Secondary | ICD-10-CM

## 2017-11-02 DIAGNOSIS — Z0000 Encounter for general adult medical examination without abnormal findings: Secondary | ICD-10-CM

## 2017-11-02 DIAGNOSIS — Z1159 Encounter for screening for other viral diseases: Secondary | ICD-10-CM | POA: Diagnosis not present

## 2017-11-02 DIAGNOSIS — Z23 Encounter for immunization: Secondary | ICD-10-CM | POA: Diagnosis not present

## 2017-11-02 DIAGNOSIS — M4802 Spinal stenosis, cervical region: Secondary | ICD-10-CM

## 2017-11-02 LAB — CBC WITH DIFFERENTIAL/PLATELET
BASOS ABS: 0 10*3/uL (ref 0.0–0.1)
Basophils Relative: 0.5 % (ref 0.0–3.0)
EOS ABS: 0.4 10*3/uL (ref 0.0–0.7)
Eosinophils Relative: 5.5 % — ABNORMAL HIGH (ref 0.0–5.0)
HEMATOCRIT: 45.3 % (ref 39.0–52.0)
Hemoglobin: 15.6 g/dL (ref 13.0–17.0)
LYMPHS PCT: 30.5 % (ref 12.0–46.0)
Lymphs Abs: 2.2 10*3/uL (ref 0.7–4.0)
MCHC: 34.4 g/dL (ref 30.0–36.0)
MCV: 93.3 fl (ref 78.0–100.0)
Monocytes Absolute: 0.5 10*3/uL (ref 0.1–1.0)
Monocytes Relative: 6.9 % (ref 3.0–12.0)
NEUTROS PCT: 56.6 % (ref 43.0–77.0)
Neutro Abs: 4.1 10*3/uL (ref 1.4–7.7)
PLATELETS: 189 10*3/uL (ref 150.0–400.0)
RBC: 4.85 Mil/uL (ref 4.22–5.81)
RDW: 13 % (ref 11.5–15.5)
WBC: 7.2 10*3/uL (ref 4.0–10.5)

## 2017-11-02 LAB — LIPID PANEL
Cholesterol: 164 mg/dL (ref 0–200)
HDL: 42.6 mg/dL (ref >39.00)
Total CHOL/HDL Ratio: 4

## 2017-11-02 LAB — LDL CHOLESTEROL, DIRECT: LDL DIRECT: 74 mg/dL

## 2017-11-02 LAB — HEPATIC FUNCTION PANEL
ALT: 27 U/L (ref 0–53)
AST: 20 U/L (ref 0–37)
Albumin: 4.6 g/dL (ref 3.5–5.2)
Alkaline Phosphatase: 60 U/L (ref 39–117)
BILIRUBIN DIRECT: 0.1 mg/dL (ref 0.0–0.3)
BILIRUBIN TOTAL: 0.5 mg/dL (ref 0.2–1.2)
Total Protein: 7.4 g/dL (ref 6.0–8.3)

## 2017-11-02 LAB — BASIC METABOLIC PANEL
BUN: 21 mg/dL (ref 6–23)
CALCIUM: 10.1 mg/dL (ref 8.4–10.5)
CO2: 34 mEq/L — ABNORMAL HIGH (ref 19–32)
CREATININE: 0.92 mg/dL (ref 0.40–1.50)
Chloride: 100 mEq/L (ref 96–112)
GFR: 88.81 mL/min (ref >60.00)
Glucose, Bld: 154 mg/dL — ABNORMAL HIGH (ref 70–99)
Potassium: 4.1 mEq/L (ref 3.5–5.1)
Sodium: 143 mEq/L (ref 135–145)

## 2017-11-02 LAB — PSA: PSA: 0.83 ng/mL (ref 0.10–4.00)

## 2017-11-02 LAB — HEMOGLOBIN A1C: Hgb A1c MFr Bld: 6.5 % (ref 4.6–6.5)

## 2017-11-02 LAB — TSH: TSH: 1.36 u[IU]/mL (ref 0.35–4.50)

## 2017-11-02 MED ORDER — POTASSIUM CHLORIDE CRYS ER 10 MEQ PO TBCR: 10.0000 meq | EXTENDED_RELEASE_TABLET | Freq: Every day | ORAL | 3 refills | Status: DC

## 2017-11-02 MED ORDER — VALSARTAN-HYDROCHLOROTHIAZIDE 320-25 MG PO TABS: 1.0000 | ORAL_TABLET | Freq: Every day | ORAL | 1 refills | Status: DC

## 2017-11-02 NOTE — Progress Notes (Signed)
Subjective:    Patient ID: Jacori Mulrooney, male    DOB: [birth year], 62 y.o.   MRN: [id number]  HPI Patient presents for yearly preventative medicine examination. He is a pleasant 62 year old male who  has a past medical history of Allergy, ASTHMA (09/07/2009), Herniated disc, HYPERLIPIDEMIA (02/07/2007), HYPERTENSION (11/29/2005), OBESITY (10/01/2007), Sleep apnea, and SLEEP APNEA, OBSTRUCTIVE (10/01/2007).   Essential Hypertension -takes Norvasc 10 mg, Hyzaar 100-25 mg, Lopressor 100 mg, and Lasix 20 mg. He has been monitoring his BP at home and has been getting readings consistently in the 160-170/90-100. He denies any blurred vision or headaches.  BP Readings from Last 3 Encounters:  11/02/17 (!) 180/108  10/02/17 (!) 150/90  05/02/17 (!) 140/92   Hyperlipidemia - Takes pravastatin 40 mg daily  Lab Results  Component Value Date   CHOL 148 11/01/2016   HDL 40.50 11/01/2016   LDLCALC 55 02/11/2015   LDLDIRECT 49.0 11/01/2016   TRIG 307.0 (H) 11/01/2016   CHOLHDL 4 11/01/2016   Asthma - Uses rescue inhaler as needed  Pre Diabetes -  Lab Results  Component Value Date   HGBA1C 6.3 05/02/2017    All immunizations and health maintenance protocols were reviewed with the patient and needed orders were placed.  Appropriate screening laboratory values were ordered for the patient including screening of hyperlipidemia, renal function and hepatic function. If indicated by BPH, a PSA was ordered.  Medication reconciliation,  past medical history, social history, problem list and allergies were reviewed in detail with the patient  Goals were established with regard to weight loss, exercise, and  diet in compliance with medications Wt Readings from Last 3 Encounters:  11/02/17 (!) 340 lb (154.2 kg)  10/02/17 (!) 334 lb (151.5 kg)  05/02/17 (!) 331 lb (150.1 kg)   End of life planning was discussed.  Is up-to-date on routine colonoscopy.  His only acute complaint is that of continued  decrease strength and weakness in his right hand. He denies any pain but does report intermittent numbness/tingling and " twitches" of the right hand   He has a history of Cervical spine surgery in 2015. Most recent cervical spine x ray on 10/03/2017 showed:   IMPRESSION: 1. No evidence of acute osseous abnormality. 2. C4-5 anterior fusion. 3. Multilevel disc degeneration and facet arthrosis with widespread neural foraminal stenosis.  He would like to be referred back to Dr. Katherine Roan at [facility]  Constitutional: Negative.   HENT: Negative.   Eyes: Negative.   Respiratory: Negative.   Cardiovascular: Negative.   Gastrointestinal: Negative.   Endocrine: Negative.   Genitourinary: Negative.   Musculoskeletal: Negative.   Skin: Negative.   Allergic/Immunologic: Negative.   Neurological: Positive for weakness.  Hematological: Negative.   Psychiatric/Behavioral: Negative.   All other systems reviewed and are negative.  Past Medical History:  Diagnosis Date  . Allergy   . ASTHMA 09/07/2009  . Herniated disc   . HYPERLIPIDEMIA 02/07/2007  . HYPERTENSION 11/29/2005  . OBESITY 10/01/2007  . Sleep apnea    cpap  . SLEEP APNEA, OBSTRUCTIVE 10/01/2007    Social History   Socioeconomic History  . Marital status: Single    Spouse name: Not on file  . Number of children: 3  . Years of education: Not on file  . Highest education level: Not on file  Occupational History  . Occupation: retired Facilities manager term disability    Employer: RETIRED  Social Needs  . Financial resource strain: Not  on file  . Food insecurity:    Worry: Not on file    Inability: Not on file  . Transportation needs:    Medical: Not on file    Non-medical: Not on file  Tobacco Use  . Smoking status: Never Smoker  . Smokeless tobacco: Never Used  Substance and Sexual Activity  . Alcohol use: Yes    Alcohol/week: 0.0 standard drinks    Comment: rare alcohol intake  . Drug  use: No  . Sexual activity: Not on file  Lifestyle  . Physical activity:    Days per week: Not on file    Minutes per session: Not on file  . Stress: Not on file  Relationships  . Social connections:    Talks on phone: Not on file    Gets together: Not on file    Attends religious service: Not on file    Active member of club or organization: Not on file    Attends meetings of clubs or organizations: Not on file    Relationship status: Not on file  . Intimate partner violence:    Fear of current or ex partner: Not on file    Emotionally abused: Not on file    Physically abused: Not on file    Forced sexual activity: Not on file  Other Topics Concern  . Not on file  Social History Narrative  . Not on file    Past Surgical History:  Procedure Laterality Date  . bicep surgery Right 2014  . ELBOW SURGERY Right 2013  . removal disc neck  2015  . SHOULDER SURGERY Right 1990    Family History  Problem Relation Age of Onset  . Colon cancer Father 70  . Lung cancer Father   . CAD Father   . Diabetes Father   . Heart attack Father   . Colon cancer Brother 48  . Heart attack Paternal Grandfather   . CAD Brother     Allergies  Allergen Reactions  . Aspirin     REACTION: unspecified  . Glycerin Nausea Only  . Penicillins     REACTION: family hx    Current Outpatient Medications on File Prior to Visit  Medication Sig Dispense Refill  . albuterol (VENTOLIN HFA) 108 (90 Base) MCG/ACT inhaler Inhale 2 puffs into the lungs every 4 (four) hours as needed. 1 Inhaler 3  . amLODipine (NORVASC) 10 MG tablet TAKE 1 TABLET (10 MG TOTAL) BY MOUTH DAILY. 90 tablet 2  . Ascorbic Acid (VITAMIN C) 1000 MG tablet Take 1,000 mg by mouth daily.    Marland Kitchen azelastine (ASTELIN) 0.1 % nasal spray Place 2 sprays into both nostrils 2 (two) times daily. Use in each nostril as directed 90 mL 3  . Cetirizine HCl (ZYRTEC PO) Take by mouth daily.    . fluticasone (FLONASE) 50 MCG/ACT nasal spray Place 1  spray into both nostrils 2 (two) times daily. 48 g 3  . furosemide (LASIX) 20 MG tablet TAKE 1 TABLET BY MOUTH ONCE DAILY 90 tablet 1  . KLOR-CON M10 10 MEQ tablet TAKE 1 TABLET BY MOUTH ONCE DAILY 90 tablet 3  . lidocaine (LIDODERM) 5 % Apply 1-3 patches to painful area for up to 12 hours per day    . LIDODERM 5 %   0  . losartan-hydrochlorothiazide (HYZAAR) 100-25 MG tablet TAKE 1 TABLET EVERY DAY 90 tablet 0  . meloxicam (MOBIC) 15 MG tablet Take 15 mg by mouth daily.    Marland Kitchen  metoprolol tartrate (LOPRESSOR) 100 MG tablet TAKE 1 TABLET TWICE DAILY 180 tablet 3  . Multiple Vitamin (MULTIVITAMIN) capsule Take 1 capsule by mouth daily.      . pravastatin (PRAVACHOL) 40 MG tablet TAKE 1 TABLET EVERY DAY 90 tablet 1   No current facility-administered medications on file prior to visit.     BP (!) 180/108   Temp 98.3 F (36.8 C)   Ht 6' 0.5" (1.842 m)   Wt (!) 340 lb (154.2 kg)   BMI 45.48 kg/m       Objective:   Physical Exam  Constitutional: He is oriented to person, place, and time. He appears well-developed and well-nourished. No distress.  Obese   HENT:  Head: Normocephalic and atraumatic.  Right Ear: External ear normal.  Left Ear: External ear normal.  Nose: Nose normal.  Mouth/Throat: Oropharynx is clear and moist. No oropharyngeal exudate.  Eyes: Pupils are equal, round, and reactive to light. Conjunctivae and EOM are normal. Right eye exhibits no discharge. Left eye exhibits no discharge. No scleral icterus.  Neck: Normal range of motion. Neck supple. No JVD present. No tracheal deviation present. No thyromegaly present.  Cardiovascular: Normal rate, regular rhythm, normal heart sounds and intact distal pulses. Exam reveals no gallop and no friction rub.  No murmur heard. Pulmonary/Chest: Effort normal and breath sounds normal. No stridor. No respiratory distress. He has no wheezes. He has no rales. He exhibits no tenderness.  Abdominal: Soft. Bowel sounds are normal. He  exhibits no distension and no mass. There is no tenderness. There is no rebound and no guarding. No hernia.  Musculoskeletal: Normal range of motion. He exhibits no edema, tenderness or deformity.  Lymphadenopathy:    He has no cervical adenopathy.  Neurological: He is alert and oriented to person, place, and time. He has normal reflexes. He displays no tremor and normal reflexes. No cranial nerve deficit or sensory deficit. He exhibits normal muscle tone. He displays no seizure activity. Coordination normal.  5/5 grip strength of left hand  4/5 grip strength of right hand - Unable to pinch together a closes pin with right hand  Skin: Skin is warm and dry. Capillary refill takes less than 2 seconds. No rash noted. He is not diaphoretic. No erythema. No pallor.  Psychiatric: He has a normal mood and affect. His behavior is normal. Judgment and thought content normal.  Nursing note and vitals reviewed.     Assessment & Plan:  1. Routine general medical examination at a health care facility - Needs to lose weight. He was encouraged to do so   - Basic metabolic panel - CBC with Differential/Platelet - Hemoglobin A1c - Hepatic function panel - Lipid panel - TSH  2. Prediabetes - Consider adding metformin  - Basic metabolic panel - CBC with Differential/Platelet - Hemoglobin A1c - Hepatic function panel - Lipid panel - TSH  3. Dyslipidemia - Consider increasing statin  - Basic metabolic panel - CBC with Differential/Platelet - Hemoglobin A1c - Hepatic function panel - Lipid panel - TSH  4. Essential hypertension - Will d/c Hyzaar and place on Diovan HCT. He was better controlled on this medication  - Basic metabolic panel - CBC with Differential/Platelet - Hemoglobin A1c - Hepatic function panel - Lipid panel - TSH - valsartan-hydrochlorothiazide (DIOVAN HCT) 320-25 MG tablet; Take 1 tablet by mouth daily.  Dispense: 90 tablet; Refill: 1 - potassium chloride (KLOR-CON  M10) 10 MEQ tablet; Take 1 tablet (10 mEq total) by mouth  daily.  Dispense: 90 tablet; Refill: 3 5. Obesity with serious comorbidity, unspecified classification, unspecified obesity type - Encouraged heart healthy diet and exercise  - Basic metabolic panel - CBC with Differential/Platelet - Hemoglobin A1c - Hepatic function panel - Lipid panel - TSH   6. Need for hepatitis C screening test  - Hep C Antibody  7. Encounter for screening for HIV  - HIV antibody  8. Need for prophylactic vaccination and inoculation against influenza  - Flu Vaccine QUAD 6+ mos PF IM (Fluarix Quad PF)  9. Cervical stenosis of spine - Ambulatory referral to Neurosurgery  10. Prostate cancer screening - PSA   Dorothyann Peng, NP

## 2017-11-03 LAB — HIV ANTIBODY (ROUTINE TESTING W REFLEX): HIV 1&2 Ab, 4th Generation: NONREACTIVE

## 2017-11-03 LAB — HEPATITIS C ANTIBODY
Hepatitis C Ab: NONREACTIVE
SIGNAL TO CUT-OFF: 0.04 (ref <1.00)

## 2017-11-06 ENCOUNTER — Other Ambulatory Visit: Payer: Self-pay | Admitting: Family Medicine

## 2017-11-06 MED ORDER — FENOFIBRATE 145 MG PO TABS: 145.0000 mg | ORAL_TABLET | Freq: Every day | ORAL | 3 refills | Status: DC

## 2017-11-06 MED ORDER — METFORMIN HCL 500 MG PO TABS: 500.0000 mg | ORAL_TABLET | Freq: Two times a day (BID) | ORAL | 0 refills | Status: DC

## 2017-11-23 ENCOUNTER — Encounter: Payer: Self-pay | Admitting: Adult Health

## 2017-11-23 ENCOUNTER — Ambulatory Visit (INDEPENDENT_AMBULATORY_CARE_PROVIDER_SITE_OTHER): Payer: Medicare HMO | Admitting: Adult Health

## 2017-11-23 VITALS — BP 150/88 | HR 58 | Temp 97.7°F | Ht 72.5 in | Wt 328.4 lb

## 2017-11-23 DIAGNOSIS — I10 Essential (primary) hypertension: Secondary | ICD-10-CM

## 2017-11-23 DIAGNOSIS — Z6841 Body Mass Index (BMI) 40.0 and over, adult: Secondary | ICD-10-CM | POA: Diagnosis not present

## 2017-11-23 NOTE — Progress Notes (Signed)
Subjective:    Patient ID: Tommy Jackson, male    DOB: [birth year], 62 y.o.   MRN: [id number]  HPI 63 year old male who presents to the office today for follow up regarding blood pressure control. During his last visit he was switched from Hyzaar to Diovan as he was better controlled on this in the past. He has been monitoring his BP sporadically over the last month, per his log, he has BP readings between 134/78 - 181/110. BP higher in the morning.    BP Readings from Last 3 Encounters:  11/23/17 (!) 150/88  11/02/17 (!) 180/108  10/02/17 (!) 150/90    During his CPE at the beginning of this month his A1c was 6.5. He was started on Metformin for precautionary measures. He reports that he took two days of medications and had severe diarrhea. He stopped the medication. He has been focusing on his diet and has been able to lose weight.   Wt Readings from Last 3 Encounters:  11/23/17 (!) 328 lb 6.4 oz (149 kg)  11/02/17 (!) 340 lb (154.2 kg)  10/02/17 (!) 334 lb (151.5 kg)     Review of Systems See HPI   Past Medical History:  Diagnosis Date  . Allergy   . ASTHMA 09/07/2009  . Herniated disc   . HYPERLIPIDEMIA 02/07/2007  . HYPERTENSION 11/29/2005  . OBESITY 10/01/2007  . Sleep apnea    cpap  . SLEEP APNEA, OBSTRUCTIVE 10/01/2007    Social History   Socioeconomic History  . Marital status: Married    Spouse name: Not on file  . Number of children: 3  . Years of education: Not on file  . Highest education level: Not on file  Occupational History  . Occupation: retired Facilities manager term disability    Employer: RETIRED  Social Needs  . Financial resource strain: Not on file  . Food insecurity:    Worry: Not on file    Inability: Not on file  . Transportation needs:    Medical: Not on file    Non-medical: Not on file  Tobacco Use  . Smoking status: Never Smoker  . Smokeless tobacco: Never Used  Substance and Sexual Activity  . Alcohol use: Yes    Alcohol/week:  0.0 standard drinks    Comment: rare alcohol intake  . Drug use: No  . Sexual activity: Not on file  Lifestyle  . Physical activity:    Days per week: Not on file    Minutes per session: Not on file  . Stress: Not on file  Relationships  . Social connections:    Talks on phone: Not on file    Gets together: Not on file    Attends religious service: Not on file    Active member of club or organization: Not on file    Attends meetings of clubs or organizations: Not on file    Relationship status: Not on file  . Intimate partner violence:    Fear of current or ex partner: Not on file    Emotionally abused: Not on file    Physically abused: Not on file    Forced sexual activity: Not on file  Other Topics Concern  . Not on file  Social History Narrative  . Not on file    Past Surgical History:  Procedure Laterality Date  . bicep surgery Right 2014  . ELBOW SURGERY Right 2013  . removal disc neck  2015  . SHOULDER SURGERY Right 1990  Family History  Problem Relation Age of Onset  . Colon cancer Father 68  . Lung cancer Father   . CAD Father   . Diabetes Father   . Heart attack Father   . Colon cancer Brother 63  . Heart attack Paternal Grandfather   . CAD Brother     Allergies  Allergen Reactions  . Aspirin     REACTION: unspecified  . Glycerin Nausea Only  . Penicillins     REACTION: family hx    Current Outpatient Medications on File Prior to Visit  Medication Sig Dispense Refill  . albuterol (VENTOLIN HFA) 108 (90 Base) MCG/ACT inhaler Inhale 2 puffs into the lungs every 4 (four) hours as needed. 1 Inhaler 3  . amLODipine (NORVASC) 10 MG tablet TAKE 1 TABLET (10 MG TOTAL) BY MOUTH DAILY. 90 tablet 2  . Ascorbic Acid (VITAMIN C) 1000 MG tablet Take 1,000 mg by mouth daily.    Marland Kitchen azelastine (ASTELIN) 0.1 % nasal spray Place 2 sprays into both nostrils 2 (two) times daily. Use in each nostril as directed 90 mL 3  . Cetirizine HCl (ZYRTEC PO) Take by mouth  daily.    . fenofibrate (TRICOR) 145 MG tablet Take 1 tablet (145 mg total) by mouth daily. 90 tablet 3  . fluticasone (FLONASE) 50 MCG/ACT nasal spray Place 1 spray into both nostrils 2 (two) times daily. 48 g 3  . furosemide (LASIX) 20 MG tablet TAKE 1 TABLET BY MOUTH ONCE DAILY 90 tablet 1  . lidocaine (LIDODERM) 5 % Apply 1-3 patches to painful area for up to 12 hours per day    . meloxicam (MOBIC) 15 MG tablet Take 15 mg by mouth daily.    . metoprolol tartrate (LOPRESSOR) 100 MG tablet TAKE 1 TABLET TWICE DAILY 180 tablet 3  . Multiple Vitamin (MULTIVITAMIN) capsule Take 1 capsule by mouth daily.      . potassium chloride (KLOR-CON M10) 10 MEQ tablet Take 1 tablet (10 mEq total) by mouth daily. 90 tablet 3  . pravastatin (PRAVACHOL) 40 MG tablet TAKE 1 TABLET EVERY DAY 90 tablet 1  . valsartan-hydrochlorothiazide (DIOVAN HCT) 320-25 MG tablet Take 1 tablet by mouth daily. 90 tablet 1   No current facility-administered medications on file prior to visit.     BP (!) 150/88 (BP Location: Left Arm, Patient Position: Sitting, Cuff Size: Large)   Pulse (!) 58   Temp 97.7 F (36.5 C) (Oral)   Ht 6' 0.5" (1.842 m)   Wt (!) 328 lb 6.4 oz (149 kg)   BMI 43.93 kg/m       Objective:   Physical Exam  Constitutional: He is oriented to person, place, and time. He appears well-developed and well-nourished. No distress.  Cardiovascular: Normal rate, regular rhythm, normal heart sounds and intact distal pulses.  Pulmonary/Chest: Effort normal and breath sounds normal.  Neurological: He is alert and oriented to person, place, and time.  Skin: Skin is warm and dry. He is not diaphoretic.  Psychiatric: He has a normal mood and affect. His behavior is normal. Judgment and thought content normal.  Vitals reviewed.     Assessment & Plan:  1. Essential hypertension - I am going to have him Take Norvasc at night. He is actively working on life style modifications. Continue with Diovan-HCT  -  Follow up in one month   Dorothyann Peng, NP

## 2017-11-26 ENCOUNTER — Ambulatory Visit: Payer: Medicare HMO | Admitting: Adult Health

## 2017-11-26 ENCOUNTER — Telehealth: Payer: Self-pay | Admitting: *Deleted

## 2017-11-26 DIAGNOSIS — M5030 Other cervical disc degeneration, unspecified cervical region: Secondary | ICD-10-CM | POA: Diagnosis not present

## 2017-11-26 DIAGNOSIS — M5412 Radiculopathy, cervical region: Secondary | ICD-10-CM | POA: Diagnosis not present

## 2017-11-26 MED ORDER — AMLODIPINE BESYLATE 10 MG PO TABS: 10.0000 mg | ORAL_TABLET | Freq: Every day | ORAL | 0 refills | Status: DC

## 2017-11-26 NOTE — Telephone Encounter (Signed)
Copied from [place] [id number]. Topic: Quick Communication - Rx Refill/Question >> Nov 26, 2017  1:55 PM Masahiro Iglesia Stare wrote: Medication  amLODipine (NORVASC) 10 MG tablet   Has the patient contacted their pharmacy yes       Preferred Pharmacy [facility]   Agent: Please be advised that RX refills may take up to 3 business days. We ask that you follow-up with your pharmacy.

## 2017-11-26 NOTE — Telephone Encounter (Signed)
Medication filled to pharmacy as requested.   

## 2017-11-27 ENCOUNTER — Other Ambulatory Visit: Payer: Self-pay | Admitting: Adult Health

## 2017-11-27 DIAGNOSIS — R600 Localized edema: Secondary | ICD-10-CM

## 2017-11-27 NOTE — Telephone Encounter (Signed)
Sent to the pharmacy by e-scribe. 

## 2017-11-29 DIAGNOSIS — M6281 Muscle weakness (generalized): Secondary | ICD-10-CM | POA: Diagnosis not present

## 2017-11-29 DIAGNOSIS — I10 Essential (primary) hypertension: Secondary | ICD-10-CM | POA: Diagnosis not present

## 2017-11-29 DIAGNOSIS — M5412 Radiculopathy, cervical region: Secondary | ICD-10-CM | POA: Diagnosis not present

## 2017-11-29 DIAGNOSIS — E119 Type 2 diabetes mellitus without complications: Secondary | ICD-10-CM | POA: Diagnosis not present

## 2017-11-29 DIAGNOSIS — M79641 Pain in right hand: Secondary | ICD-10-CM | POA: Diagnosis not present

## 2017-11-29 DIAGNOSIS — M5030 Other cervical disc degeneration, unspecified cervical region: Secondary | ICD-10-CM | POA: Diagnosis not present

## 2017-12-04 DIAGNOSIS — R29898 Other symptoms and signs involving the musculoskeletal system: Secondary | ICD-10-CM | POA: Diagnosis not present

## 2017-12-04 DIAGNOSIS — R531 Weakness: Secondary | ICD-10-CM | POA: Diagnosis not present

## 2017-12-04 DIAGNOSIS — M542 Cervicalgia: Secondary | ICD-10-CM | POA: Diagnosis not present

## 2017-12-17 DIAGNOSIS — R29898 Other symptoms and signs involving the musculoskeletal system: Secondary | ICD-10-CM | POA: Diagnosis not present

## 2017-12-17 DIAGNOSIS — M542 Cervicalgia: Secondary | ICD-10-CM | POA: Diagnosis not present

## 2017-12-17 DIAGNOSIS — R531 Weakness: Secondary | ICD-10-CM | POA: Diagnosis not present

## 2017-12-19 DIAGNOSIS — M542 Cervicalgia: Secondary | ICD-10-CM | POA: Diagnosis not present

## 2017-12-19 DIAGNOSIS — R29898 Other symptoms and signs involving the musculoskeletal system: Secondary | ICD-10-CM | POA: Diagnosis not present

## 2017-12-19 DIAGNOSIS — R531 Weakness: Secondary | ICD-10-CM | POA: Diagnosis not present

## 2017-12-24 DIAGNOSIS — R29898 Other symptoms and signs involving the musculoskeletal system: Secondary | ICD-10-CM | POA: Diagnosis not present

## 2017-12-24 DIAGNOSIS — R531 Weakness: Secondary | ICD-10-CM | POA: Diagnosis not present

## 2017-12-24 DIAGNOSIS — M542 Cervicalgia: Secondary | ICD-10-CM | POA: Diagnosis not present

## 2017-12-26 DIAGNOSIS — R531 Weakness: Secondary | ICD-10-CM | POA: Diagnosis not present

## 2017-12-26 DIAGNOSIS — R29898 Other symptoms and signs involving the musculoskeletal system: Secondary | ICD-10-CM | POA: Diagnosis not present

## 2017-12-26 DIAGNOSIS — M542 Cervicalgia: Secondary | ICD-10-CM | POA: Diagnosis not present

## 2017-12-31 DIAGNOSIS — M5412 Radiculopathy, cervical region: Secondary | ICD-10-CM | POA: Diagnosis not present

## 2017-12-31 DIAGNOSIS — M5030 Other cervical disc degeneration, unspecified cervical region: Secondary | ICD-10-CM | POA: Diagnosis not present

## 2018-01-02 DIAGNOSIS — M5412 Radiculopathy, cervical region: Secondary | ICD-10-CM | POA: Diagnosis not present

## 2018-01-02 DIAGNOSIS — M5030 Other cervical disc degeneration, unspecified cervical region: Secondary | ICD-10-CM | POA: Diagnosis not present

## 2018-01-09 DIAGNOSIS — M5412 Radiculopathy, cervical region: Secondary | ICD-10-CM | POA: Diagnosis not present

## 2018-01-09 DIAGNOSIS — M5030 Other cervical disc degeneration, unspecified cervical region: Secondary | ICD-10-CM | POA: Diagnosis not present

## 2018-01-14 DIAGNOSIS — M5030 Other cervical disc degeneration, unspecified cervical region: Secondary | ICD-10-CM | POA: Diagnosis not present

## 2018-01-14 DIAGNOSIS — M5412 Radiculopathy, cervical region: Secondary | ICD-10-CM | POA: Diagnosis not present

## 2018-01-15 DIAGNOSIS — M545 Low back pain: Secondary | ICD-10-CM | POA: Diagnosis not present

## 2018-01-17 DIAGNOSIS — M5412 Radiculopathy, cervical region: Secondary | ICD-10-CM | POA: Diagnosis not present

## 2018-01-17 DIAGNOSIS — M5030 Other cervical disc degeneration, unspecified cervical region: Secondary | ICD-10-CM | POA: Diagnosis not present

## 2018-01-30 ENCOUNTER — Other Ambulatory Visit: Payer: Self-pay | Admitting: Adult Health

## 2018-01-30 NOTE — Telephone Encounter (Signed)
Sent to the pharmacy by e-scribe. 

## 2018-02-06 DIAGNOSIS — M5030 Other cervical disc degeneration, unspecified cervical region: Secondary | ICD-10-CM | POA: Diagnosis not present

## 2018-02-06 DIAGNOSIS — Z6841 Body Mass Index (BMI) 40.0 and over, adult: Secondary | ICD-10-CM | POA: Diagnosis not present

## 2018-02-06 DIAGNOSIS — M5412 Radiculopathy, cervical region: Secondary | ICD-10-CM | POA: Diagnosis not present

## 2018-02-07 ENCOUNTER — Ambulatory Visit (INDEPENDENT_AMBULATORY_CARE_PROVIDER_SITE_OTHER): Payer: Medicare HMO | Admitting: Adult Health

## 2018-02-07 ENCOUNTER — Ambulatory Visit (INDEPENDENT_AMBULATORY_CARE_PROVIDER_SITE_OTHER): Payer: Medicare HMO

## 2018-02-07 ENCOUNTER — Encounter: Payer: Self-pay | Admitting: Adult Health

## 2018-02-07 VITALS — BP 144/80 | Temp 98.3°F | Wt 314.0 lb

## 2018-02-07 DIAGNOSIS — M25532 Pain in left wrist: Secondary | ICD-10-CM

## 2018-02-07 DIAGNOSIS — M19032 Primary osteoarthritis, left wrist: Secondary | ICD-10-CM | POA: Diagnosis not present

## 2018-02-07 DIAGNOSIS — R7303 Prediabetes: Secondary | ICD-10-CM | POA: Diagnosis not present

## 2018-02-07 LAB — POCT GLYCOSYLATED HEMOGLOBIN (HGB A1C): HBA1C, POC (CONTROLLED DIABETIC RANGE): 5.6 % (ref 0.0–7.0)

## 2018-02-07 NOTE — Progress Notes (Signed)
Subjective:    Patient ID: Tommy Jackson, male    DOB: 05/12/1955, 62 y.o.   MRN: [id number]  HPI 62 year old male who  has a past medical history of Allergy, ASTHMA (09/07/2009), Herniated disc, HYPERLIPIDEMIA (02/07/2007), HYPERTENSION (11/29/2005), OBESITY (10/01/2007), Sleep apnea, and SLEEP APNEA, OBSTRUCTIVE (10/01/2007).  He presents to the office today for follow up regarding A1c. He has been ' pre diabetic for some time now. During his CPE in September 2019 is A1c was 6.5. he was started on Metformin for precautionary measures but stopped taking this due to diarrhea. He decided to work on life style modifications, is eating healthier but has not been exercising. He has been able to lose about 26 pounds since the beginning of September.   Wt Readings from Last 3 Encounters:  02/07/18 (!) 314 lb (142.4 kg)  11/23/17 (!) 328 lb 6.4 oz (149 kg)  11/02/17 (!) 340 lb (154.2 kg)   He has an acute issue today of left wrist pain below the thumb. Pain started about 2-3 weeks ago. He has noticed a small " lump" and has pain with flexion. Has no pain with horizontal or vertical wrist movements. Denies trauma   Review of Systems See HPI   Past Medical History:  Diagnosis Date  . Allergy   . ASTHMA 09/07/2009  . Herniated disc   . HYPERLIPIDEMIA 02/07/2007  . HYPERTENSION 11/29/2005  . OBESITY 10/01/2007  . Sleep apnea    cpap  . SLEEP APNEA, OBSTRUCTIVE 10/01/2007    Social History   Socioeconomic History  . Marital status: Married    Spouse name: Not on file  . Number of children: 3  . Years of education: Not on file  . Highest education level: Not on file  Occupational History  . Occupation: retired Facilities manager term disability    Employer: RETIRED  Social Needs  . Financial resource strain: Not on file  . Food insecurity:    Worry: Not on file    Inability: Not on file  . Transportation needs:    Medical: Not on file    Non-medical: Not on file  Tobacco Use  . Smoking  status: Never Smoker  . Smokeless tobacco: Never Used  Substance and Sexual Activity  . Alcohol use: Yes    Alcohol/week: 0.0 standard drinks    Comment: rare alcohol intake  . Drug use: No  . Sexual activity: Not on file  Lifestyle  . Physical activity:    Days per week: Not on file    Minutes per session: Not on file  . Stress: Not on file  Relationships  . Social connections:    Talks on phone: Not on file    Gets together: Not on file    Attends religious service: Not on file    Active member of club or organization: Not on file    Attends meetings of clubs or organizations: Not on file    Relationship status: Not on file  . Intimate partner violence:    Fear of current or ex partner: Not on file    Emotionally abused: Not on file    Physically abused: Not on file    Forced sexual activity: Not on file  Other Topics Concern  . Not on file  Social History Narrative  . Not on file    Past Surgical History:  Procedure Laterality Date  . bicep surgery Right 2014  . ELBOW SURGERY Right 2013  . removal disc neck  65  . SHOULDER SURGERY Right 1990    Family History  Problem Relation Age of Onset  . Colon cancer Father 42  . Lung cancer Father   . CAD Father   . Diabetes Father   . Heart attack Father   . Colon cancer Brother 26  . Heart attack Paternal Grandfather   . CAD Brother     Allergies  Allergen Reactions  . Aspirin     REACTION: unspecified  . Glycerin Nausea Only  . Penicillins     REACTION: family hx    Current Outpatient Medications on File Prior to Visit  Medication Sig Dispense Refill  . albuterol (VENTOLIN HFA) 108 (90 Base) MCG/ACT inhaler Inhale 2 puffs into the lungs every 4 (four) hours as needed. 1 Inhaler 3  . amLODipine (NORVASC) 10 MG tablet TAKE 1 TABLET EVERY DAY 90 tablet 2  . Ascorbic Acid (VITAMIN C) 1000 MG tablet Take 1,000 mg by mouth daily.    Marland Kitchen azelastine (ASTELIN) 0.1 % nasal spray Place 2 sprays into both nostrils 2  (two) times daily. Use in each nostril as directed 90 mL 3  . Cetirizine HCl (ZYRTEC PO) Take by mouth daily.    . fenofibrate (TRICOR) 145 MG tablet Take 1 tablet (145 mg total) by mouth daily. 90 tablet 3  . fluticasone (FLONASE) 50 MCG/ACT nasal spray Place 1 spray into both nostrils 2 (two) times daily. 48 g 3  . furosemide (LASIX) 20 MG tablet TAKE 1 TABLET BY MOUTH ONCE DAILY 90 tablet 1  . lidocaine (LIDODERM) 5 % Apply 1-3 patches to painful area for up to 12 hours per day    . meloxicam (MOBIC) 15 MG tablet Take 15 mg by mouth daily.    . metoprolol tartrate (LOPRESSOR) 100 MG tablet TAKE 1 TABLET TWICE DAILY 180 tablet 3  . Multiple Vitamin (MULTIVITAMIN) capsule Take 1 capsule by mouth daily.      . potassium chloride (KLOR-CON M10) 10 MEQ tablet Take 1 tablet (10 mEq total) by mouth daily. 90 tablet 3  . pravastatin (PRAVACHOL) 40 MG tablet TAKE 1 TABLET EVERY DAY 90 tablet 1  . valsartan-hydrochlorothiazide (DIOVAN HCT) 320-25 MG tablet Take 1 tablet by mouth daily. 90 tablet 1   No current facility-administered medications on file prior to visit.     BP (!) 144/80   Temp 98.3 F (36.8 C)   Wt (!) 314 lb (142.4 kg)   BMI 42.00 kg/m       Objective:   Physical Exam Vitals signs and nursing note reviewed.  Constitutional:      Appearance: Normal appearance. He is obese.  Cardiovascular:     Rate and Rhythm: Normal rate and regular rhythm.  Pulmonary:     Effort: Pulmonary effort is normal.     Breath sounds: Normal breath sounds.  Musculoskeletal:        General: Swelling and tenderness present. No deformity or signs of injury.     Comments: Trace swelling and possible calcification to radial head of left arm. No apparent pain with palpation but does appear in pain with flexion   Skin:    General: Skin is warm and dry.  Neurological:     General: No focal deficit present.     Mental Status: He is alert and oriented to person, place, and time.       Assessment  & Plan:  1. Prediabetes  - POCT A1C- 5.6  - he has done great with  weight loss with diet alone. Encouraged to continue and to add exercise into regimen  - He will follow up in March for lipid check or sooner if needed  2. Left wrist pain - DG Wrist Complete Left; Future  Dorothyann Peng, NP

## 2018-02-21 DIAGNOSIS — M5412 Radiculopathy, cervical region: Secondary | ICD-10-CM | POA: Diagnosis not present

## 2018-02-21 DIAGNOSIS — M47892 Other spondylosis, cervical region: Secondary | ICD-10-CM | POA: Diagnosis not present

## 2018-02-21 DIAGNOSIS — M4312 Spondylolisthesis, cervical region: Secondary | ICD-10-CM | POA: Diagnosis not present

## 2018-03-18 DIAGNOSIS — M5412 Radiculopathy, cervical region: Secondary | ICD-10-CM | POA: Diagnosis not present

## 2018-03-18 DIAGNOSIS — M542 Cervicalgia: Secondary | ICD-10-CM | POA: Diagnosis not present

## 2018-03-18 DIAGNOSIS — M4802 Spinal stenosis, cervical region: Secondary | ICD-10-CM | POA: Diagnosis not present

## 2018-03-18 DIAGNOSIS — M5030 Other cervical disc degeneration, unspecified cervical region: Secondary | ICD-10-CM | POA: Diagnosis not present

## 2018-03-22 ENCOUNTER — Ambulatory Visit (INDEPENDENT_AMBULATORY_CARE_PROVIDER_SITE_OTHER): Payer: Medicare HMO | Admitting: Internal Medicine

## 2018-03-22 ENCOUNTER — Encounter: Payer: Self-pay | Admitting: Internal Medicine

## 2018-03-22 VITALS — BP 120/80 | HR 64 | Temp 98.2°F | Wt 316.3 lb

## 2018-03-22 DIAGNOSIS — M545 Low back pain, unspecified: Secondary | ICD-10-CM

## 2018-03-22 DIAGNOSIS — Z6841 Body Mass Index (BMI) 40.0 and over, adult: Secondary | ICD-10-CM | POA: Diagnosis not present

## 2018-03-22 NOTE — Patient Instructions (Signed)
Great seeing you today.  Instructions: -take tylenol or mobic for lower back pain as needed -use ice to help decrease pain or inflammation -follow up at the end of next week if symptoms dont improve for xray

## 2018-03-22 NOTE — Progress Notes (Signed)
Acute Office Visit  Subjective:    Patient ID: Tommy Jackson, male    DOB: [birth year], 63 y.o.   MRN: [id number]  Chief Complaint  Patient presents with  . left gluteal pain    HPI: Patient is in today for lower back pain due to tripping over a cord at [place] a few hours ago.  He fell back and fell into a game machine about 10 feet behind him.  He c/o pain 8/10.  He describes pain as a dull pain near his left buttocks.    Past Medical History:  Diagnosis Date  . Allergy   . ASTHMA 09/07/2009  . Herniated disc   . HYPERLIPIDEMIA 02/07/2007  . HYPERTENSION 11/29/2005  . OBESITY 10/01/2007  . Sleep apnea    cpap  . SLEEP APNEA, OBSTRUCTIVE 10/01/2007    Past Surgical History:  Procedure Laterality Date  . bicep surgery Right 2014  . ELBOW SURGERY Right 2013  . removal disc neck  2015  . SHOULDER SURGERY Right 1990    Family History  Problem Relation Age of Onset  . Colon cancer Father 32  . Lung cancer Father   . CAD Father   . Diabetes Father   . Heart attack Father   . Colon cancer Brother 21  . Heart attack Paternal Grandfather   . CAD Brother     Social History   Socioeconomic History  . Marital status: Married    Spouse name: Not on file  . Number of children: 3  . Years of education: Not on file  . Highest education level: Not on file  Occupational History  . Occupation: retired Facilities manager term disability    Employer: RETIRED  Social Needs  . Financial resource strain: Not on file  . Food insecurity:    Worry: Not on file    Inability: Not on file  . Transportation needs:    Medical: Not on file    Non-medical: Not on file  Tobacco Use  . Smoking status: Never Smoker  . Smokeless tobacco: Never Used  Substance and Sexual Activity  . Alcohol use: Yes    Alcohol/week: 0.0 standard drinks    Comment: rare alcohol intake  . Drug use: No  . Sexual activity: Not on file  Lifestyle  . Physical activity:    Days per week: Not on  file    Minutes per session: Not on file  . Stress: Not on file  Relationships  . Social connections:    Talks on phone: Not on file    Gets together: Not on file    Attends religious service: Not on file    Active member of club or organization: Not on file    Attends meetings of clubs or organizations: Not on file    Relationship status: Not on file  . Intimate partner violence:    Fear of current or ex partner: Not on file    Emotionally abused: Not on file    Physically abused: Not on file    Forced sexual activity: Not on file  Other Topics Concern  . Not on file  Social History Narrative  . Not on file    Outpatient Medications Prior to Visit  Medication Sig Dispense Refill  . albuterol (VENTOLIN HFA) 108 (90 Base) MCG/ACT inhaler Inhale 2 puffs into the lungs every 4 (four) hours as needed. 1 Inhaler 3  . amLODipine (NORVASC) 10 MG tablet TAKE 1 TABLET EVERY DAY 90 tablet 2  .  Ascorbic Acid (VITAMIN C) 1000 MG tablet Take 1,000 mg by mouth daily.    Marland Kitchen azelastine (ASTELIN) 0.1 % nasal spray Place 2 sprays into both nostrils 2 (two) times daily. Use in each nostril as directed 90 mL 3  . Cetirizine HCl (ZYRTEC PO) Take by mouth daily.    . fenofibrate (TRICOR) 145 MG tablet Take 1 tablet (145 mg total) by mouth daily. 90 tablet 3  . fluticasone (FLONASE) 50 MCG/ACT nasal spray Place 1 spray into both nostrils 2 (two) times daily. 48 g 3  . furosemide (LASIX) 20 MG tablet TAKE 1 TABLET BY MOUTH ONCE DAILY 90 tablet 1  . lidocaine (LIDODERM) 5 % Apply 1-3 patches to painful area for up to 12 hours per day    . meloxicam (MOBIC) 15 MG tablet Take 15 mg by mouth daily.    . metoprolol tartrate (LOPRESSOR) 100 MG tablet TAKE 1 TABLET TWICE DAILY 180 tablet 3  . Multiple Vitamin (MULTIVITAMIN) capsule Take 1 capsule by mouth daily.      . potassium chloride (KLOR-CON M10) 10 MEQ tablet Take 1 tablet (10 mEq total) by mouth daily. 90 tablet 3  . pravastatin (PRAVACHOL) 40 MG tablet  TAKE 1 TABLET EVERY DAY 90 tablet 1  . valsartan-hydrochlorothiazide (DIOVAN HCT) 320-25 MG tablet Take 1 tablet by mouth daily. 90 tablet 1   No facility-administered medications prior to visit.     Allergies  Allergen Reactions  . Aspirin     REACTION: unspecified  . Glycerin Nausea Only  . Penicillins     REACTION: family hx    ROS: Constitutional: Denies fever, chills, diaphoresis, appetite change and fatigue.    Respiratory: Denies SOB, DOE, cough, chest tightness,  and wheezing.   Cardiovascular: Denies chest pain, palpitations and leg swelling.  Musculoskeletal: c/o myalgias, back pain that started about 11am today      Objective:    Physical Exam  BP 120/80 (BP Location: Left Arm, Patient Position: Sitting, Cuff Size: Large)   Pulse 64   Temp 98.2 F (36.8 C) (Oral)   Wt (!) 316 lb 4.8 oz (143.5 kg)   SpO2 95%   BMI 42.31 kg/m  Wt Readings from Last 3 Encounters:  03/22/18 (!) 316 lb 4.8 oz (143.5 kg)  02/07/18 (!) 314 lb (142.4 kg)  11/23/17 (!) 328 lb 6.4 oz (149 kg)    There are no preventive care reminders to display for this patient.   Lab Results  Component Value Date   TSH 1.36 11/02/2017   Lab Results  Component Value Date   WBC 7.2 11/02/2017   HGB 15.6 11/02/2017   HCT 45.3 11/02/2017   MCV 93.3 11/02/2017   PLT 189.0 11/02/2017   Lab Results  Component Value Date   NA 143 11/02/2017   K 4.1 11/02/2017   CO2 34 (H) 11/02/2017   GLUCOSE 154 (H) 11/02/2017   BUN 21 11/02/2017   CREATININE 0.92 11/02/2017   BILITOT 0.5 11/02/2017   ALKPHOS 60 11/02/2017   AST 20 11/02/2017   ALT 27 11/02/2017   PROT 7.4 11/02/2017   ALBUMIN 4.6 11/02/2017   CALCIUM 10.1 11/02/2017   GFR 88.81 11/02/2017   Lab Results  Component Value Date   CHOL 164 11/02/2017   Lab Results  Component Value Date   HDL 42.60 11/02/2017   Lab Results  Component Value Date   LDLCALC 55 02/11/2015   Lab Results  Component Value Date   TRIG (H)  11/02/2017  404.0 Triglyceride is over 400; calculations on Lipids are invalid.   Lab Results  Component Value Date   CHOLHDL 4 11/02/2017   Lab Results  Component Value Date   HGBA1C 5.6 02/07/2018       Assessment & Plan:   Problem List Items Addressed This Visit    None    Visit Diagnoses    Acute left-sided low back pain without sciatica    -  Primary    -take tylenol or mobic for back pain as needed -stretch out back as needed to keep from getting stiff -use ice packs as needed to help with back pain or tightness -follow up towards the end of next week if symptoms do not improve for an xray    No orders of the defined types were placed in this encounter.    Enzo Bi, RN  DNP student

## 2018-03-28 ENCOUNTER — Ambulatory Visit: Payer: Medicare HMO

## 2018-03-28 ENCOUNTER — Encounter: Payer: Self-pay | Admitting: Adult Health

## 2018-03-28 ENCOUNTER — Ambulatory Visit (INDEPENDENT_AMBULATORY_CARE_PROVIDER_SITE_OTHER): Payer: Medicare HMO | Admitting: Adult Health

## 2018-03-28 ENCOUNTER — Ambulatory Visit (INDEPENDENT_AMBULATORY_CARE_PROVIDER_SITE_OTHER): Payer: Medicare HMO

## 2018-03-28 VITALS — BP 142/82 | Temp 97.9°F | Wt 316.0 lb

## 2018-03-28 DIAGNOSIS — M533 Sacrococcygeal disorders, not elsewhere classified: Secondary | ICD-10-CM

## 2018-03-28 DIAGNOSIS — S3993XA Unspecified injury of pelvis, initial encounter: Secondary | ICD-10-CM | POA: Diagnosis not present

## 2018-03-28 NOTE — Progress Notes (Signed)
Subjective:    Patient ID: Tommy Jackson, male    DOB: 10/23/1955, 63 y.o.   MRN: [id number]  HPI 63 year old male who  has a past medical history of Allergy, ASTHMA (09/07/2009), Herniated disc, HYPERLIPIDEMIA (02/07/2007), HYPERTENSION (11/29/2005), OBESITY (10/01/2007), Sleep apnea, and SLEEP APNEA, OBSTRUCTIVE (10/01/2007).  He presents to the office today for follow up after being seen six days ago by another provider for low back pain due to tripping over a cord at [place] and hitting his low back on the corner of an arcade machine that was approx 10 feet behind him. Pain is described as " dull" and " aching" and is near his buttocks.   He has been using conservative treatments but still is in significant pain. He has developed a large bruise on his low back and buttocks. Pain is worse with sitting in certain positions.    Review of Systems See HPI   Past Medical History:  Diagnosis Date  . Allergy   . ASTHMA 09/07/2009  . Herniated disc   . HYPERLIPIDEMIA 02/07/2007  . HYPERTENSION 11/29/2005  . OBESITY 10/01/2007  . Sleep apnea    cpap  . SLEEP APNEA, OBSTRUCTIVE 10/01/2007    Social History   Socioeconomic History  . Marital status: Married    Spouse name: Not on file  . Number of children: 3  . Years of education: Not on file  . Highest education level: Not on file  Occupational History  . Occupation: retired Facilities manager term disability    Employer: RETIRED  Social Needs  . Financial resource strain: Not on file  . Food insecurity:    Worry: Not on file    Inability: Not on file  . Transportation needs:    Medical: Not on file    Non-medical: Not on file  Tobacco Use  . Smoking status: Never Smoker  . Smokeless tobacco: Never Used  Substance and Sexual Activity  . Alcohol use: Yes    Alcohol/week: 0.0 standard drinks    Comment: rare alcohol intake  . Drug use: No  . Sexual activity: Not on file  Lifestyle  . Physical activity:    Days per  week: Not on file    Minutes per session: Not on file  . Stress: Not on file  Relationships  . Social connections:    Talks on phone: Not on file    Gets together: Not on file    Attends religious service: Not on file    Active member of club or organization: Not on file    Attends meetings of clubs or organizations: Not on file    Relationship status: Not on file  . Intimate partner violence:    Fear of current or ex partner: Not on file    Emotionally abused: Not on file    Physically abused: Not on file    Forced sexual activity: Not on file  Other Topics Concern  . Not on file  Social History Narrative  . Not on file    Past Surgical History:  Procedure Laterality Date  . bicep surgery Right 2014  . ELBOW SURGERY Right 2013  . removal disc neck  2015  . SHOULDER SURGERY Right 1990    Family History  Problem Relation Age of Onset  . Colon cancer Father 9  . Lung cancer Father   . CAD Father   . Diabetes Father   . Heart attack Father   . Colon cancer Brother 71  .  Heart attack Paternal Grandfather   . CAD Brother     Allergies  Allergen Reactions  . Aspirin     REACTION: unspecified  . Glycerin Nausea Only  . Penicillins     REACTION: family hx  . Metformin And Related Diarrhea    Current Outpatient Medications on File Prior to Visit  Medication Sig Dispense Refill  . albuterol (VENTOLIN HFA) 108 (90 Base) MCG/ACT inhaler Inhale 2 puffs into the lungs every 4 (four) hours as needed. 1 Inhaler 3  . amLODipine (NORVASC) 10 MG tablet TAKE 1 TABLET EVERY DAY 90 tablet 2  . Ascorbic Acid (VITAMIN C) 1000 MG tablet Take 1,000 mg by mouth daily.    Marland Kitchen azelastine (ASTELIN) 0.1 % nasal spray Place 2 sprays into both nostrils 2 (two) times daily. Use in each nostril as directed 90 mL 3  . Cetirizine HCl (ZYRTEC PO) Take by mouth daily.    . fenofibrate (TRICOR) 145 MG tablet Take 1 tablet (145 mg total) by mouth daily. 90 tablet 3  . fluticasone (FLONASE) 50  MCG/ACT nasal spray Place 1 spray into both nostrils 2 (two) times daily. 48 g 3  . furosemide (LASIX) 20 MG tablet TAKE 1 TABLET BY MOUTH ONCE DAILY 90 tablet 1  . lidocaine (LIDODERM) 5 % Apply 1-3 patches to painful area for up to 12 hours per day    . meloxicam (MOBIC) 15 MG tablet Take 15 mg by mouth daily.    . metoprolol tartrate (LOPRESSOR) 100 MG tablet TAKE 1 TABLET TWICE DAILY 180 tablet 3  . Multiple Vitamin (MULTIVITAMIN) capsule Take 1 capsule by mouth daily.      . potassium chloride (KLOR-CON M10) 10 MEQ tablet Take 1 tablet (10 mEq total) by mouth daily. 90 tablet 3  . pravastatin (PRAVACHOL) 40 MG tablet TAKE 1 TABLET EVERY DAY 90 tablet 1  . valsartan-hydrochlorothiazide (DIOVAN HCT) 320-25 MG tablet Take 1 tablet by mouth daily. 90 tablet 1   No current facility-administered medications on file prior to visit.     BP (!) 142/82   Temp 97.9 F (36.6 C)   Wt (!) 316 lb (143.3 kg)   BMI 42.27 kg/m       Objective:   Physical Exam Vitals signs reviewed.  Constitutional:      Appearance: Normal appearance.  Skin:    General: Skin is warm and dry.     Capillary Refill: Capillary refill takes less than 2 seconds.     Findings: Bruising present.     Comments: Bruising in various stages of healing noted to sacral area.   Neurological:     General: No focal deficit present.     Mental Status: He is alert and oriented to person, place, and time.  Psychiatric:        Mood and Affect: Mood normal.        Behavior: Behavior normal.        Thought Content: Thought content normal.        Judgment: Judgment normal.       Assessment & Plan:  1. Sacral back pain - likely muscular in nature but do to nature of injury and continued pain will get xray of sacrum.  - Continue with conservative measures  - DG Sacrum/Coccyx; Future  [employer]

## 2018-04-04 DIAGNOSIS — M5412 Radiculopathy, cervical region: Secondary | ICD-10-CM | POA: Diagnosis not present

## 2018-04-04 DIAGNOSIS — G5601 Carpal tunnel syndrome, right upper limb: Secondary | ICD-10-CM | POA: Diagnosis not present

## 2018-04-10 DIAGNOSIS — M542 Cervicalgia: Secondary | ICD-10-CM | POA: Diagnosis not present

## 2018-04-10 DIAGNOSIS — M5030 Other cervical disc degeneration, unspecified cervical region: Secondary | ICD-10-CM | POA: Diagnosis not present

## 2018-04-10 DIAGNOSIS — M4802 Spinal stenosis, cervical region: Secondary | ICD-10-CM | POA: Diagnosis not present

## 2018-04-10 DIAGNOSIS — Z6841 Body Mass Index (BMI) 40.0 and over, adult: Secondary | ICD-10-CM | POA: Diagnosis not present

## 2018-04-10 DIAGNOSIS — G5601 Carpal tunnel syndrome, right upper limb: Secondary | ICD-10-CM | POA: Diagnosis not present

## 2018-04-10 DIAGNOSIS — E119 Type 2 diabetes mellitus without complications: Secondary | ICD-10-CM | POA: Diagnosis not present

## 2018-04-10 DIAGNOSIS — M5412 Radiculopathy, cervical region: Secondary | ICD-10-CM | POA: Diagnosis not present

## 2018-04-22 ENCOUNTER — Other Ambulatory Visit: Payer: Self-pay | Admitting: Adult Health

## 2018-04-22 DIAGNOSIS — I10 Essential (primary) hypertension: Secondary | ICD-10-CM

## 2018-04-23 NOTE — Telephone Encounter (Signed)
Sent to the pharmacy by e-scribe. 

## 2018-05-10 ENCOUNTER — Other Ambulatory Visit: Payer: Self-pay | Admitting: Family Medicine

## 2018-05-10 ENCOUNTER — Ambulatory Visit: Payer: Medicare HMO

## 2018-05-10 ENCOUNTER — Other Ambulatory Visit: Payer: Self-pay

## 2018-05-10 DIAGNOSIS — E785 Hyperlipidemia, unspecified: Secondary | ICD-10-CM

## 2018-05-10 DIAGNOSIS — R7303 Prediabetes: Secondary | ICD-10-CM

## 2018-05-10 LAB — LIPID PANEL
Cholesterol: 123 mg/dL (ref 0–200)
HDL: 34.1 mg/dL — ABNORMAL LOW (ref >39.00)
NonHDL: 88.89
Total CHOL/HDL Ratio: 4
Triglycerides: 213 mg/dL — ABNORMAL HIGH (ref 0.0–149.0)
VLDL: 42.6 mg/dL — ABNORMAL HIGH (ref 0.0–40.0)

## 2018-05-10 LAB — LDL CHOLESTEROL, DIRECT: Direct LDL: 57 mg/dL

## 2018-05-13 ENCOUNTER — Telehealth: Payer: Self-pay | Admitting: Adult Health

## 2018-05-13 ENCOUNTER — Encounter: Payer: Self-pay | Admitting: Adult Health

## 2018-05-13 DIAGNOSIS — R600 Localized edema: Secondary | ICD-10-CM

## 2018-05-13 NOTE — Telephone Encounter (Signed)
Copied from [place] [id number]. Topic: Quick Communication - Rx Refill/Question >> May 13, 2018  5:06 PM Blase Mess A wrote: Medication: furosemide (LASIX) 20 MG tablet [[id number]]   Has the patient contacted their pharmacy? Yes  (Agent: If no, request that the patient contact the pharmacy for the refill.) (Agent: If yes, when and what did the pharmacy advise?)  Preferred Pharmacy (with phone number or street name): [facility], [place] [phone] (Phone) [phone] (Fax)    Agent: Please be advised that RX refills may take up to 3 business days. We ask that you follow-up with your pharmacy.

## 2018-05-14 MED ORDER — FUROSEMIDE 20 MG PO TABS: 20.0000 mg | ORAL_TABLET | Freq: Every day | ORAL | 1 refills | Status: DC

## 2018-05-14 NOTE — Telephone Encounter (Signed)
Duplicate message.  This was sent to [facility] earlier this morning.

## 2018-05-28 ENCOUNTER — Encounter: Payer: Self-pay | Admitting: Gastroenterology

## 2018-07-23 ENCOUNTER — Encounter: Payer: Self-pay | Admitting: Adult Health

## 2018-07-23 MED ORDER — FENOFIBRATE 145 MG PO TABS: 145.0000 mg | ORAL_TABLET | Freq: Every day | ORAL | 1 refills | Status: DC

## 2018-08-15 ENCOUNTER — Ambulatory Visit: Payer: Medicare HMO

## 2018-08-15 ENCOUNTER — Other Ambulatory Visit: Payer: Self-pay

## 2018-08-15 VITALS — Ht 72.0 in | Wt 300.0 lb

## 2018-08-15 DIAGNOSIS — Z86010 Personal history of colonic polyps: Secondary | ICD-10-CM

## 2018-08-15 NOTE — Progress Notes (Signed)
No egg or soy allergy known to patient  No issues with past sedation with any surgeries  or procedures, no intubation problems  No diet pills per patient No home 02 use per patient  No blood thinners per patient  Pt denies issues with constipation  No A fib or A flutter  EMMI video sent to pt's e mail  

## 2018-08-20 ENCOUNTER — Telehealth: Payer: Self-pay | Admitting: Gastroenterology

## 2018-08-20 DIAGNOSIS — Z86010 Personal history of colonic polyps: Secondary | ICD-10-CM

## 2018-08-20 MED ORDER — SUPREP BOWEL PREP KIT 17.5-3.13-1.6 GM/177ML PO SOLN: 1.0000 | Freq: Once | ORAL | 0 refills | Status: AC

## 2018-08-20 NOTE — Telephone Encounter (Signed)
Pt states prep is 45$= informed pt this is an excellent price for this prep as it could be up to 145$- Pt states he will see what he can do and let us know if he cannot make the  payment  Lelan Pons

## 2018-08-20 NOTE — Telephone Encounter (Signed)
Suprep sent to [facility] [address] [place] as per pt request

## 2018-08-20 NOTE — Telephone Encounter (Signed)
Message left for the patient to verify, [place] [facility] for rx

## 2018-08-20 NOTE — Telephone Encounter (Signed)
Patient called in wanting to let the nurse know that the colon prep is to expensive.

## 2018-08-20 NOTE — Telephone Encounter (Signed)
Pt verified that his pharmacy is [facility] on [address] in [place].

## 2018-08-20 NOTE — Telephone Encounter (Signed)
Patient called said he needs his suprep resent to [facility] on [address]

## 2018-08-21 MED ORDER — PEG 3350-KCL-NA BICARB-NACL 420 G PO SOLR: 4000.0000 mL | Freq: Once | ORAL | 0 refills | Status: AC

## 2018-08-21 NOTE — Telephone Encounter (Signed)
Pt called back stating rx still to expensive.

## 2018-08-21 NOTE — Telephone Encounter (Signed)
Patient states he can not afford Suprep. Patient's prep switched to Golytely , he states he will be able to drink all that with out any problems. Golytely RX sent to pt's pharmacy. New prep instructions sent to pt via MyChart and in the [place] is aware.

## 2018-08-28 ENCOUNTER — Telehealth: Payer: Self-pay | Admitting: Gastroenterology

## 2018-08-28 NOTE — Telephone Encounter (Signed)
Spoke with patient regarding covid-19 screening questions °Covid-19 Screening Questions: ° °Do you now or have you had a fever in the last 14 days? no ° °Do you have any respiratory symptoms of shortness of breath or cough now or in the last 14 days? no ° °Do you have any family members or close contacts with diagnosed or suspected Covid-19 in the past 14 days? no ° °Have you been tested for Covid-19 and found to be positive? no  ° °Pt made aware of that care partner may wait in the car or come up to the lobby during the procedure but will need to provide their own mask. °

## 2018-08-29 ENCOUNTER — Ambulatory Visit (AMBULATORY_SURGERY_CENTER): Payer: Medicare HMO | Admitting: Gastroenterology

## 2018-08-29 ENCOUNTER — Other Ambulatory Visit: Payer: Self-pay

## 2018-08-29 ENCOUNTER — Encounter: Payer: Self-pay | Admitting: Gastroenterology

## 2018-08-29 VITALS — BP 148/75 | HR 51 | Temp 98.5°F | Resp 14 | Ht 72.0 in | Wt 300.0 lb

## 2018-08-29 DIAGNOSIS — K6289 Other specified diseases of anus and rectum: Secondary | ICD-10-CM

## 2018-08-29 DIAGNOSIS — D129 Benign neoplasm of anus and anal canal: Secondary | ICD-10-CM

## 2018-08-29 DIAGNOSIS — D128 Benign neoplasm of rectum: Secondary | ICD-10-CM

## 2018-08-29 DIAGNOSIS — Z86010 Personal history of colonic polyps: Secondary | ICD-10-CM

## 2018-08-29 DIAGNOSIS — A630 Anogenital (venereal) warts: Secondary | ICD-10-CM | POA: Diagnosis not present

## 2018-08-29 MED ORDER — SODIUM CHLORIDE 0.9 % IV SOLN: 500.0000 mL | Freq: Once | INTRAVENOUS | Status: DC

## 2018-08-29 NOTE — Progress Notes (Signed)
PT taken to PACU. Monitors in place. VSS. Report given to RN. 

## 2018-08-29 NOTE — Op Note (Signed)
[facility] Patient Name: Kota Ciancio Procedure Date: 08/29/2018 1:02 PM MRN: [id number] Endoscopist: Remo Lipps P. Havery Moros , MD Age: 63 Referring MD:  Date of Birth: 1955-05-31 Gender: Male Account #: [id number] Procedure:                Colonoscopy Indications:              Surveillance: Personal history of adenomatous                            polyps on last colonoscopy 3 years ago, strong                            family history of colon cancer in father and                            brother (diagnosed < age 20) Medicines:                Monitored Anesthesia Care Procedure:                Pre-Anesthesia Assessment:                           - Prior to the procedure, a History and Physical                            was performed, and patient medications and                            allergies were reviewed. The patient's tolerance of                            previous anesthesia was also reviewed. The risks                            and benefits of the procedure and the sedation                            options and risks were discussed with the patient.                            All questions were answered, and informed consent                            was obtained. Prior Anticoagulants: The patient has                            taken no previous anticoagulant or antiplatelet                            agents. ASA Grade Assessment: III - A patient with                            severe systemic disease. After reviewing the risks  and benefits, the patient was deemed in                            satisfactory condition to undergo the procedure.                           After obtaining informed consent, the colonoscope                            was passed under direct vision. Throughout the                            procedure, the patient's blood pressure, pulse, and                            oxygen saturations were monitored  continuously. The                            Colonoscope was introduced through the anus and                            advanced to the the cecum, identified by                            appendiceal orifice and ileocecal valve. The                            colonoscopy was performed without difficulty. The                            patient tolerated the procedure well. The quality                            of the bowel preparation was good. The ileocecal                            valve, appendiceal orifice, and rectum were                            photographed. Scope In: 1:05:17 PM Scope Out: 1:24:38 PM Scope Withdrawal Time: 0 hours 14 minutes 29 seconds  Total Procedure Duration: 0 hours 19 minutes 21 seconds  Findings:                 The perianal and digital rectal examinations were                            normal.                           Multiple medium-mouthed diverticula were found in                            the sigmoid colon, ascending colon and cecum.  Internal hemorrhoids were found during retroflexion.                           A suspected hypertrophied anal papilla(e) was                            appreciated on retroflexion. Biopsies were taken                            with a cold forceps for histology to rule out AIN.                           The exam was otherwise without abnormality. No                            polyps Complications:            No immediate complications. Estimated blood loss:                            Minimal. Estimated Blood Loss:     Estimated blood loss was minimal. Impression:               - Diverticulosis in the sigmoid colon, in the                            ascending colon and in the cecum.                           - Internal hemorrhoids.                           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 Biopsied to                            rule out AIN.                           - The examination  was otherwise normal. Recommendation:           - Patient has a contact number available for                            emergencies. The signs and symptoms of potential                            delayed complications were discussed with the                            patient. Return to normal activities tomorrow.                            Written discharge instructions were provided to the                            patient.                           -  Resume previous diet.                           - Continue present medications.                           - Await pathology results.                           - Anticipate repeat colonoscopy in 5 years given                            family history of colon cancer Ezreal Turay P. Delfino Friesen, MD 08/29/2018 1:29:31 PM This report has been signed electronically.

## 2018-08-29 NOTE — Patient Instructions (Signed)
Handout on polyps given   YOU HAD AN ENDOSCOPIC PROCEDURE TODAY AT THE [facility]:   Refer to the procedure report that was given to you for any specific questions about what was found during the examination.  If the procedure report does not answer your questions, please call your gastroenterologist to clarify.  If you requested that your care partner not be given the details of your procedure findings, then the procedure report has been included in a sealed envelope for you to review at your convenience later.  YOU SHOULD EXPECT: Some feelings of bloating in the abdomen. Passage of more gas than usual.  Walking can help get rid of the air that was put into your GI tract during the procedure and reduce the bloating. If you had a lower endoscopy (such as a colonoscopy or flexible sigmoidoscopy) you may notice spotting of blood in your stool or on the toilet paper. If you underwent a bowel prep for your procedure, you may not have a normal bowel movement for a few days.  Please Note:  You might notice some irritation and congestion in your nose or some drainage.  This is from the oxygen used during your procedure.  There is no need for concern and it should clear up in a day or so.  SYMPTOMS TO REPORT IMMEDIATELY:   Following lower endoscopy (colonoscopy or flexible sigmoidoscopy):  Excessive amounts of blood in the stool  Significant tenderness or worsening of abdominal pains  Swelling of the abdomen that is new, acute  Fever of 100F or higher   For urgent or emergent issues, a gastroenterologist can be reached at any hour by calling [phone].   DIET:  We do recommend a small meal at first, but then you may proceed to your regular diet.  Drink plenty of fluids but you should avoid alcoholic beverages for 24 hours.  ACTIVITY:  You should plan to take it easy for the rest of today and you should NOT DRIVE or use heavy machinery until tomorrow (because of the sedation  medicines used during the test).    FOLLOW UP: Our staff will call the number listed on your records 48-72 hours following your procedure to check on you and address any questions or concerns that you may have regarding the information given to you following your procedure. If we do not reach you, we will leave a message.  We will attempt to reach you two times.  During this call, we will ask if you have developed any symptoms of COVID 19. If you develop any symptoms (ie: fever, flu-like symptoms, shortness of breath, cough etc.) before then, please call [phone].  If you test positive for Covid 19 in the 2 weeks post procedure, please call and report this information to us.    If any biopsies were taken you will be contacted by phone or by letter within the next 1-3 weeks.  Please call us at [phone] if you have not heard about the biopsies in 3 weeks.    SIGNATURES/CONFIDENTIALITY: You and/or your care partner have signed paperwork which will be entered into your electronic medical record.  These signatures attest to the fact that that the information above on your After Visit Summary has been reviewed and is understood.  Full responsibility of the confidentiality of this discharge information lies with you and/or your care-partner. 

## 2018-08-29 NOTE — Progress Notes (Signed)
Pt's states no medical or surgical changes since previsit or office visit.  Temp-June bullock  Vital signs-judy bronson

## 2018-08-29 NOTE — Progress Notes (Signed)
Called to room to assist during endoscopic procedure.  Patient ID and intended procedure confirmed with present staff. Received instructions for my participation in the procedure from the performing physician.  

## 2018-09-03 ENCOUNTER — Telehealth: Payer: Self-pay

## 2018-09-03 NOTE — Telephone Encounter (Signed)
  Follow up Call-  Call back number 08/29/2018  Post procedure Call Back phone  # [phone]  Permission to leave phone message Yes  Some recent data might be hidden     Patient questions:  Do you have a fever, pain , or abdominal swelling? No. Pain Score  0 *  Have you tolerated food without any problems? Yes.    Have you been able to return to your normal activities? Yes.    Do you have any questions about your discharge instructions: Diet   No. Medications  No. Follow up visit  No.  Do you have questions or concerns about your Care? No.  Actions: * If pain score is 4 or above: No action needed, pain <4.  1. Have you developed a fever since your procedure? No  2.   Have you had an respiratory symptoms (SOB or cough) since your procedure? No  3.   Have you tested positive for COVID 19 since your procedure No  4.   Have you had any family members/close contacts diagnosed with the COVID 19 since your procedure?  No   If yes to any of these questions please route to Joylene John, RN and Alphonsa Gin, RN.

## 2018-09-03 NOTE — Telephone Encounter (Signed)
NO ANSWER, MESSAGE LEFT FOR PATIENT. 

## 2018-09-17 ENCOUNTER — Telehealth: Payer: Self-pay | Admitting: Gastroenterology

## 2018-09-17 NOTE — Telephone Encounter (Signed)
Pt stated that following his colonoscopy 7/2, he may need to have surgery.  Pt was calling for an update.

## 2018-09-19 DIAGNOSIS — K6289 Other specified diseases of anus and rectum: Secondary | ICD-10-CM | POA: Diagnosis not present

## 2018-10-21 ENCOUNTER — Other Ambulatory Visit: Payer: Self-pay | Admitting: Adult Health

## 2018-10-22 ENCOUNTER — Encounter: Payer: Self-pay | Admitting: Family Medicine

## 2018-10-22 NOTE — Telephone Encounter (Signed)
Sent to the pharmacy by e-scribe for 90 days.  Letter released to the pt by mychart.

## 2018-10-25 ENCOUNTER — Other Ambulatory Visit: Payer: Self-pay | Admitting: Adult Health

## 2018-10-25 DIAGNOSIS — I10 Essential (primary) hypertension: Secondary | ICD-10-CM

## 2018-10-29 NOTE — Telephone Encounter (Signed)
Due for cpx

## 2018-10-29 NOTE — Telephone Encounter (Signed)
Sent to the pharmacy by e-scribe for 90 days.  Pt has upcoming cpx. 

## 2018-11-04 ENCOUNTER — Other Ambulatory Visit: Payer: Self-pay | Admitting: Adult Health

## 2018-11-04 DIAGNOSIS — R600 Localized edema: Secondary | ICD-10-CM

## 2018-11-05 NOTE — Telephone Encounter (Signed)
Sent to the pharmacy by e-scribe. 

## 2018-12-11 ENCOUNTER — Encounter: Payer: Medicare HMO | Admitting: Adult Health

## 2018-12-25 ENCOUNTER — Other Ambulatory Visit: Payer: Self-pay | Admitting: Adult Health

## 2018-12-25 DIAGNOSIS — I10 Essential (primary) hypertension: Secondary | ICD-10-CM

## 2018-12-26 NOTE — Telephone Encounter (Signed)
Sent to the pharmacy by e-scribe. 

## 2019-01-10 ENCOUNTER — Encounter: Payer: Medicare HMO | Admitting: Adult Health

## 2019-01-10 DIAGNOSIS — M545 Low back pain: Secondary | ICD-10-CM | POA: Diagnosis not present

## 2019-01-21 ENCOUNTER — Other Ambulatory Visit: Payer: Self-pay | Admitting: Adult Health

## 2019-01-21 DIAGNOSIS — R600 Localized edema: Secondary | ICD-10-CM

## 2019-01-22 ENCOUNTER — Other Ambulatory Visit: Payer: Self-pay | Admitting: Adult Health

## 2019-01-22 DIAGNOSIS — I10 Essential (primary) hypertension: Secondary | ICD-10-CM

## 2019-01-22 NOTE — Telephone Encounter (Signed)
Sent to the pharmacy by e-scribe. 

## 2019-01-29 ENCOUNTER — Other Ambulatory Visit: Payer: Self-pay | Admitting: Adult Health

## 2019-01-29 DIAGNOSIS — R600 Localized edema: Secondary | ICD-10-CM

## 2019-01-29 DIAGNOSIS — I10 Essential (primary) hypertension: Secondary | ICD-10-CM

## 2019-01-31 NOTE — Telephone Encounter (Signed)
DENIED.  SENT TO THE PHARMACY ON 01/22/2019.  THANKS

## 2019-03-25 ENCOUNTER — Other Ambulatory Visit: Payer: Self-pay

## 2019-03-25 ENCOUNTER — Ambulatory Visit (INDEPENDENT_AMBULATORY_CARE_PROVIDER_SITE_OTHER): Payer: Medicare HMO | Admitting: Adult Health

## 2019-03-25 ENCOUNTER — Encounter: Payer: Self-pay | Admitting: Adult Health

## 2019-03-25 VITALS — BP 130/80 | HR 61 | Temp 97.9°F | Ht 72.5 in | Wt 326.0 lb

## 2019-03-25 DIAGNOSIS — E785 Hyperlipidemia, unspecified: Secondary | ICD-10-CM

## 2019-03-25 DIAGNOSIS — G4733 Obstructive sleep apnea (adult) (pediatric): Secondary | ICD-10-CM | POA: Diagnosis not present

## 2019-03-25 DIAGNOSIS — Z125 Encounter for screening for malignant neoplasm of prostate: Secondary | ICD-10-CM | POA: Diagnosis not present

## 2019-03-25 DIAGNOSIS — I10 Essential (primary) hypertension: Secondary | ICD-10-CM | POA: Diagnosis not present

## 2019-03-25 DIAGNOSIS — Z23 Encounter for immunization: Secondary | ICD-10-CM

## 2019-03-25 DIAGNOSIS — J4520 Mild intermittent asthma, uncomplicated: Secondary | ICD-10-CM

## 2019-03-25 DIAGNOSIS — Z0000 Encounter for general adult medical examination without abnormal findings: Secondary | ICD-10-CM

## 2019-03-25 DIAGNOSIS — E669 Obesity, unspecified: Secondary | ICD-10-CM | POA: Diagnosis not present

## 2019-03-25 MED ORDER — FENOFIBRATE 145 MG PO TABS: 145.0000 mg | ORAL_TABLET | Freq: Every day | ORAL | 3 refills | Status: DC

## 2019-03-25 MED ORDER — PRAVASTATIN SODIUM 40 MG PO TABS: 40.0000 mg | ORAL_TABLET | Freq: Every day | ORAL | 3 refills | Status: DC

## 2019-03-25 MED ORDER — ALBUTEROL SULFATE HFA 108 (90 BASE) MCG/ACT IN AERS: 2.0000 | INHALATION_SPRAY | RESPIRATORY_TRACT | 3 refills | Status: AC | PRN

## 2019-03-25 MED ORDER — AMLODIPINE BESYLATE 10 MG PO TABS: 10.0000 mg | ORAL_TABLET | Freq: Every day | ORAL | 3 refills | Status: DC

## 2019-03-25 MED ORDER — VALSARTAN-HYDROCHLOROTHIAZIDE 320-25 MG PO TABS: 1.0000 | ORAL_TABLET | Freq: Every day | ORAL | 3 refills | Status: DC

## 2019-03-25 NOTE — Progress Notes (Signed)
Subjective:    Patient ID: Tommy Jackson, male    DOB: 12-14-1955, 64 y.o.   MRN: [id number]  HPI Patient presents for yearly preventative medicine examination. He is a pleasant 64 year old male who  has a past medical history of Allergy, ASTHMA (09/07/2009), Herniated disc, HYPERLIPIDEMIA (02/07/2007), HYPERTENSION (11/29/2005), OBESITY (10/01/2007), Sleep apnea, and SLEEP APNEA, OBSTRUCTIVE (10/01/2007).  Essential Hypertension -currently prescribed Norvasc 10 mg, Diovan-HCT 320-25 mg, Lopressor 100 mg, and Lasix 20 mg.  Denies dizziness, lightheadedness, chest pain, shortness of breath, or syncopal episodes. He does not monitor his blood pressure at home.   BP Readings from Last 3 Encounters:  03/25/19 (!) 150/80  08/29/18 (!) 148/75  03/28/18 (!) 142/82    Hyperlipidemia-takes pravastatin 40 mg daily and fenofibrate 145 mg daily.  He denies myalgia or fatigue Lab Results  Component Value Date   CHOL 123 05/10/2018   HDL 34.10 (L) 05/10/2018   LDLCALC 55 02/11/2015   LDLDIRECT 57.0 05/10/2018   TRIG 213.0 (H) 05/10/2018   CHOLHDL 4 05/10/2018    Asthma -mild and intermittent.  He uses his rescue inhaler as needed. Needs refill.   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 Prescribed Mobic by ortho.   All immunizations and health maintenance protocols were reviewed with the patient and needed orders were placed. Due for flu vaccination  Appropriate screening laboratory values were ordered for the patient including screening of hyperlipidemia, renal function and hepatic function. If indicated by BPH, a PSA was ordered.  Medication reconciliation,  past medical history, social history, problem list and allergies were reviewed in detail with the patient  Goals were established with regard to weight loss, exercise, and  diet in compliance with medications. His diet has been bad since the holidays and he has put on 26 pounds since he was last seen. He has  started working on getting the weight off by eating healthier.  Wt Readings from Last 10 Encounters:  03/25/19 (!) 326 lb (147.9 kg)  08/29/18 300 lb (136.1 kg)  08/15/18 300 lb (136.1 kg)  03/28/18 (!) 316 lb (143.3 kg)  03/22/18 (!) 316 lb 4.8 oz (143.5 kg)  02/07/18 (!) 314 lb (142.4 kg)  11/23/17 (!) 328 lb 6.4 oz (149 kg)  11/02/17 (!) 340 lb (154.2 kg)  10/02/17 (!) 334 lb (151.5 kg)  05/02/17 (!) 331 lb (150.1 kg)    He is up-to-date on routine screening colonoscopy, his last was in July 2020 and he is on the 5-year plan due to polyps.   Review of Systems  Constitutional: Negative.   HENT: Negative.   Eyes: Negative.   Respiratory: Negative.   Cardiovascular: Negative.   Gastrointestinal: Negative.   Endocrine: Negative.   Genitourinary: Negative.   Musculoskeletal: Positive for arthralgias.  Skin: Negative.   Allergic/Immunologic: Negative.   Neurological: Negative.   Hematological: Negative.   Psychiatric/Behavioral: Negative.   All other systems reviewed and are negative.  Past Medical History:  Diagnosis Date  . Allergy   . ASTHMA 09/07/2009  . Herniated disc   . HYPERLIPIDEMIA 02/07/2007  . HYPERTENSION 11/29/2005  . OBESITY 10/01/2007  . Sleep apnea    cpap  . SLEEP APNEA, OBSTRUCTIVE 10/01/2007    Social History   Socioeconomic History  . Marital status: Married    Spouse name: Not on file  . Number of children: 3  . Years of education: Not on file  . Highest education level: Not on file  Occupational History  .  Occupation: retired Facilities manager term disability    Employer: RETIRED  Tobacco Use  . Smoking status: Never Smoker  . Smokeless tobacco: Never Used  Substance and Sexual Activity  . Alcohol use: Yes    Alcohol/week: 0.0 standard drinks    Comment: rare alcohol intake  . Drug use: No  . Sexual activity: Not on file  Other Topics Concern  . Not on file  Social History Narrative  . Not on file   Social Determinants of Health    Financial Resource Strain:   . Difficulty of Paying Living Expenses: Not on file  Food Insecurity:   . Worried About Charity fundraiser in the Last Year: Not on file  . Ran Out of Food in the Last Year: Not on file  Transportation Needs:   . Lack of Transportation (Medical): Not on file  . Lack of Transportation (Non-Medical): Not on file  Physical Activity:   . Days of Exercise per Week: Not on file  . Minutes of Exercise per Session: Not on file  Stress:   . Feeling of Stress : Not on file  Social Connections:   . Frequency of Communication with Friends and Family: Not on file  . Frequency of Social Gatherings with Friends and Family: Not on file  . Attends Religious Services: Not on file  . Active Member of Clubs or Organizations: Not on file  . Attends Archivist Meetings: Not on file  . Marital Status: Not on file  Intimate Partner Violence:   . Fear of Current or Ex-Partner: Not on file  . Emotionally Abused: Not on file  . Physically Abused: Not on file  . Sexually Abused: Not on file    Past Surgical History:  Procedure Laterality Date  . bicep surgery Right 2014  . COLONOSCOPY    . ELBOW SURGERY Right 2013  . POLYPECTOMY    . removal disc neck  2015  . SHOULDER SURGERY Right 1990    Family History  Problem Relation Age of Onset  . Colon cancer Father 55  . Lung cancer Father   . CAD Father   . Diabetes Father   . Heart attack Father   . Colon cancer Brother 26  . Heart attack Paternal Grandfather   . CAD Brother   . Esophageal cancer Neg Hx   . Stomach cancer Neg Hx   . Rectal cancer Neg Hx     Allergies  Allergen Reactions  . Aspirin     REACTION: unspecified  . Glycerin Nausea Only  . Penicillins     REACTION: family hx  . Metformin And Related Diarrhea    Current Outpatient Medications on File Prior to Visit  Medication Sig Dispense Refill  . albuterol (VENTOLIN HFA) 108 (90 Base) MCG/ACT inhaler Inhale 2 puffs into the lungs  every 4 (four) hours as needed. (Patient not taking: Reported on 08/15/2018) 1 Inhaler 3  . amLODipine (NORVASC) 10 MG tablet TAKE 1 TABLET EVERY DAY 90 tablet 0  . Ascorbic Acid (VITAMIN C) 1000 MG tablet Take 1,000 mg by mouth daily.    Marland Kitchen azelastine (ASTELIN) 0.1 % nasal spray Place 2 sprays into both nostrils 2 (two) times daily. Use in each nostril as directed 90 mL 3  . Cetirizine HCl (ZYRTEC PO) Take by mouth daily.    . fenofibrate (TRICOR) 145 MG tablet TAKE 1 TABLET (145 MG TOTAL) BY MOUTH DAILY. 90 tablet 0  . fluticasone (FLONASE) 50 MCG/ACT nasal spray  Place 1 spray into both nostrils 2 (two) times daily. 48 g 3  . furosemide (LASIX) 20 MG tablet TAKE 1 TABLET (20 MG TOTAL) BY MOUTH DAILY. 90 tablet 0  . lidocaine (LIDODERM) 5 % Apply 1-3 patches to painful area for up to 12 hours per day    . Melatonin-Pyridoxine 3-1 MG TABS Take by mouth.    . meloxicam (MOBIC) 15 MG tablet Take 15 mg by mouth daily.    . metoprolol tartrate (LOPRESSOR) 100 MG tablet TAKE 1 TABLET TWICE DAILY 180 tablet 0  . Multiple Vitamin (MULTIVITAMIN) capsule Take 1 capsule by mouth daily.      . NON FORMULARY     . potassium chloride (KLOR-CON) 10 MEQ tablet TAKE 1 TABLET (10 MEQ TOTAL) BY MOUTH DAILY. 90 tablet 0  . pravastatin (PRAVACHOL) 40 MG tablet TAKE 1 TABLET EVERY DAY 90 tablet 0  . valsartan-hydrochlorothiazide (DIOVAN-HCT) 320-25 MG tablet TAKE 1 TABLET EVERY DAY 90 tablet 0   No current facility-administered medications on file prior to visit.    BP (!) 150/80   Pulse 61   Temp 97.9 F (36.6 C) (Other (Comment))   Ht 6' 0.5" (1.842 m)   Wt (!) 326 lb (147.9 kg)   SpO2 95%   BMI 43.61 kg/m       Objective:   Physical Exam Vitals and nursing note reviewed.  Constitutional:      Appearance: Normal appearance. He is obese.  HENT:     Head: Normocephalic.     Right Ear: Tympanic membrane, ear canal and external ear normal. There is no impacted cerumen.     Left Ear: Tympanic  membrane, ear canal and external ear normal. There is no impacted cerumen.     Nose: Nose normal. No congestion or rhinorrhea.     Mouth/Throat:     Mouth: Mucous membranes are moist.     Pharynx: Oropharynx is clear. No oropharyngeal exudate or posterior oropharyngeal erythema.  Eyes:     Extraocular Movements: Extraocular movements intact.     Conjunctiva/sclera: Conjunctivae normal.     Pupils: Pupils are equal, round, and reactive to light.  Neck:     Vascular: Carotid bruit present.  Cardiovascular:     Rate and Rhythm: Normal rate and regular rhythm.     Pulses: Normal pulses.     Heart sounds: Normal heart sounds. No murmur. No friction rub. No gallop.   Pulmonary:     Effort: Pulmonary effort is normal. No respiratory distress.     Breath sounds: Normal breath sounds. No stridor. No wheezing, rhonchi or rales.  Chest:     Chest wall: No tenderness.  Abdominal:     General: Abdomen is flat. Bowel sounds are normal. There is no distension.     Palpations: Abdomen is soft. There is no mass.     Tenderness: There is no abdominal tenderness. There is no right CVA tenderness, left CVA tenderness, guarding or rebound.     Hernia: No hernia is present.  Musculoskeletal:        General: No swelling, tenderness, deformity or signs of injury. Normal range of motion.     Cervical back: Normal range of motion and neck supple.     Right lower leg: No edema.     Left lower leg: No edema.  Skin:    General: Skin is warm and dry.     Capillary Refill: Capillary refill takes less than 2 seconds.     Coloration:  Skin is not jaundiced or pale.     Findings: No bruising, erythema, lesion or rash.  Neurological:     General: No focal deficit present.     Mental Status: He is alert and oriented to person, place, and time.     Cranial Nerves: No cranial nerve deficit.     Sensory: No sensory deficit.     Motor: No weakness.     Coordination: Coordination normal.     Gait: Gait normal.      Deep Tendon Reflexes: Reflexes normal.  Psychiatric:        Mood and Affect: Mood normal.        Behavior: Behavior normal.        Thought Content: Thought content normal.        Judgment: Judgment normal.       Assessment & Plan:  1. Routine general medical examination at a health care facility - Follow up in one year or sooner if needed - needs to really work on lifestyle modifications  - CBC with Differential/Platelet; Future - Comprehensive metabolic panel; Future - Hemoglobin A1c; Future - Lipid panel; Future - TSH; Future  2. Essential hypertension - No change in medications - CBC with Differential/Platelet; Future - Comprehensive metabolic panel; Future - Hemoglobin A1c; Future - Lipid panel; Future - TSH; Future - amLODipine (NORVASC) 10 MG tablet; Take 1 tablet (10 mg total) by mouth daily.  Dispense: 90 tablet; Refill: 3 - valsartan-hydrochlorothiazide (DIOVAN-HCT) 320-25 MG tablet; Take 1 tablet by mouth daily.  Dispense: 90 tablet; Refill: 3  3. Dyslipidemia - Consider change in statin  - CBC with Differential/Platelet; Future - Comprehensive metabolic panel; Future - Hemoglobin A1c; Future - Lipid panel; Future - TSH; Future - fenofibrate (TRICOR) 145 MG tablet; Take 1 tablet (145 mg total) by mouth daily.  Dispense: 90 tablet; Refill: 3 - pravastatin (PRAVACHOL) 40 MG tablet; Take 1 tablet (40 mg total) by mouth daily.  Dispense: 90 tablet; Refill: 3  4. Obesity with serious comorbidity, unspecified classification, unspecified obesity type - Needs to lose significant weight. Encouraged heart healthy diet and exercise - CBC with Differential/Platelet; Future - Comprehensive metabolic panel; Future - Hemoglobin A1c; Future - Lipid panel; Future - TSH; Future  5. Obstructive sleep apnea  - CBC with Differential/Platelet; Future - Comprehensive metabolic panel; Future - Hemoglobin A1c; Future - Lipid panel; Future - TSH; Future  6. Prostate cancer  screening  - PSA; Future  7. Need for immunization against influenza  - Flu Vaccine QUAD 36+ mos IM   8. Mild intermittent asthma without complication  - albuterol (VENTOLIN HFA) 108 (90 Base) MCG/ACT inhaler; Inhale 2 puffs into the lungs every 4 (four) hours as needed.  Dispense: 6.7 g; Refill: 3  Dorothyann Peng, NP

## 2019-04-07 ENCOUNTER — Other Ambulatory Visit: Payer: Self-pay

## 2019-04-08 ENCOUNTER — Other Ambulatory Visit (INDEPENDENT_AMBULATORY_CARE_PROVIDER_SITE_OTHER): Payer: Medicare HMO

## 2019-04-08 DIAGNOSIS — E785 Hyperlipidemia, unspecified: Secondary | ICD-10-CM | POA: Diagnosis not present

## 2019-04-08 DIAGNOSIS — I10 Essential (primary) hypertension: Secondary | ICD-10-CM

## 2019-04-08 DIAGNOSIS — G4733 Obstructive sleep apnea (adult) (pediatric): Secondary | ICD-10-CM | POA: Diagnosis not present

## 2019-04-08 DIAGNOSIS — Z0000 Encounter for general adult medical examination without abnormal findings: Secondary | ICD-10-CM | POA: Diagnosis not present

## 2019-04-08 DIAGNOSIS — E669 Obesity, unspecified: Secondary | ICD-10-CM

## 2019-04-08 DIAGNOSIS — Z125 Encounter for screening for malignant neoplasm of prostate: Secondary | ICD-10-CM

## 2019-04-08 LAB — CBC WITH DIFFERENTIAL/PLATELET
Basophils Absolute: 0.1 10*3/uL (ref 0.0–0.1)
Basophils Relative: 0.6 % (ref 0.0–3.0)
Eosinophils Absolute: 0.3 10*3/uL (ref 0.0–0.7)
Eosinophils Relative: 3.3 % (ref 0.0–5.0)
HCT: 45.6 % (ref 39.0–52.0)
Hemoglobin: 15.7 g/dL (ref 13.0–17.0)
Lymphocytes Relative: 23.1 % (ref 12.0–46.0)
Lymphs Abs: 2.1 10*3/uL (ref 0.7–4.0)
MCHC: 34.5 g/dL (ref 30.0–36.0)
MCV: 94.4 fl (ref 78.0–100.0)
Monocytes Absolute: 0.6 10*3/uL (ref 0.1–1.0)
Monocytes Relative: 7 % (ref 3.0–12.0)
Neutro Abs: 6 10*3/uL (ref 1.4–7.7)
Neutrophils Relative %: 66 % (ref 43.0–77.0)
Platelets: 196 10*3/uL (ref 150.0–400.0)
RBC: 4.83 Mil/uL (ref 4.22–5.81)
RDW: 13.1 % (ref 11.5–15.5)
WBC: 9.1 10*3/uL (ref 4.0–10.5)

## 2019-04-08 LAB — PSA: PSA: 0.61 ng/mL (ref 0.10–4.00)

## 2019-04-08 LAB — COMPREHENSIVE METABOLIC PANEL
ALT: 41 U/L (ref 0–53)
AST: 28 U/L (ref 0–37)
Albumin: 4.9 g/dL (ref 3.5–5.2)
Alkaline Phosphatase: 40 U/L (ref 39–117)
BUN: 22 mg/dL (ref 6–23)
CO2: 30 mEq/L (ref 19–32)
Calcium: 9.9 mg/dL (ref 8.4–10.5)
Chloride: 100 mEq/L (ref 96–112)
Creatinine, Ser: 1.11 mg/dL (ref 0.40–1.50)
GFR: 66.97 mL/min (ref >60.00)
Glucose, Bld: 137 mg/dL — ABNORMAL HIGH (ref 70–99)
Potassium: 4.1 mEq/L (ref 3.5–5.1)
Sodium: 140 mEq/L (ref 135–145)
Total Bilirubin: 0.8 mg/dL (ref 0.2–1.2)
Total Protein: 7.6 g/dL (ref 6.0–8.3)

## 2019-04-08 LAB — LIPID PANEL
Cholesterol: 146 mg/dL (ref 0–200)
HDL: 37.8 mg/dL — ABNORMAL LOW (ref >39.00)
NonHDL: 107.95
Total CHOL/HDL Ratio: 4
Triglycerides: 278 mg/dL — ABNORMAL HIGH (ref 0.0–149.0)
VLDL: 55.6 mg/dL — ABNORMAL HIGH (ref 0.0–40.0)

## 2019-04-08 LAB — TSH: TSH: 1.78 u[IU]/mL (ref 0.35–4.50)

## 2019-04-08 LAB — HEMOGLOBIN A1C: Hgb A1c MFr Bld: 6.3 % (ref 4.6–6.5)

## 2019-04-08 LAB — LDL CHOLESTEROL, DIRECT: Direct LDL: 69 mg/dL

## 2019-05-12 ENCOUNTER — Telehealth: Payer: Self-pay | Admitting: Adult Health

## 2019-05-12 ENCOUNTER — Telehealth: Payer: Self-pay | Admitting: *Deleted

## 2019-05-12 DIAGNOSIS — I10 Essential (primary) hypertension: Secondary | ICD-10-CM

## 2019-05-12 DIAGNOSIS — J4520 Mild intermittent asthma, uncomplicated: Secondary | ICD-10-CM

## 2019-05-12 DIAGNOSIS — R600 Localized edema: Secondary | ICD-10-CM

## 2019-05-12 NOTE — Telephone Encounter (Signed)
Patient called after hours line 05/10/2019 at 0918. Patients wants to know why is Rxs are being cancelled

## 2019-05-12 NOTE — Telephone Encounter (Signed)
Medication Refill: Furosemide  Potassium chloride Metoprolol tartrate  Pharmacy: [facility]  FAX: [phone]   Pt has a week left of all 3

## 2019-05-13 MED ORDER — FUROSEMIDE 20 MG PO TABS: 20.0000 mg | ORAL_TABLET | Freq: Every day | ORAL | 3 refills | Status: DC

## 2019-05-13 MED ORDER — METOPROLOL TARTRATE 100 MG PO TABS: 100.0000 mg | ORAL_TABLET | Freq: Two times a day (BID) | ORAL | 3 refills | Status: DC

## 2019-05-13 MED ORDER — POTASSIUM CHLORIDE CRYS ER 10 MEQ PO TBCR: 10.0000 meq | EXTENDED_RELEASE_TABLET | Freq: Every day | ORAL | 3 refills | Status: DC

## 2019-05-13 NOTE — Telephone Encounter (Signed)
See other telephone encounter.

## 2019-05-13 NOTE — Telephone Encounter (Signed)
Sent to the pharmacy by e-scribe. 

## 2019-06-02 ENCOUNTER — Telehealth: Payer: Self-pay | Admitting: *Deleted

## 2019-06-02 NOTE — Telephone Encounter (Signed)
Patient called the after hours line on 05/30/2019. Patient reports he has Gout in his left foot big toe, and would like something called in.

## 2019-06-03 NOTE — Telephone Encounter (Signed)
Pt no longer needed to be seen for gout. He said he had a flare up and needed something to ease the pain at that time. He is feeling better.

## 2019-06-03 NOTE — Telephone Encounter (Signed)
Left a message for a return call.

## 2020-01-26 DIAGNOSIS — M545 Low back pain, unspecified: Secondary | ICD-10-CM | POA: Diagnosis not present

## 2020-02-04 ENCOUNTER — Other Ambulatory Visit: Payer: Self-pay | Admitting: Adult Health

## 2020-02-04 DIAGNOSIS — I10 Essential (primary) hypertension: Secondary | ICD-10-CM

## 2020-02-04 DIAGNOSIS — E785 Hyperlipidemia, unspecified: Secondary | ICD-10-CM

## 2020-02-04 DIAGNOSIS — R600 Localized edema: Secondary | ICD-10-CM

## 2020-03-23 ENCOUNTER — Other Ambulatory Visit: Payer: Self-pay

## 2020-03-23 ENCOUNTER — Ambulatory Visit (INDEPENDENT_AMBULATORY_CARE_PROVIDER_SITE_OTHER): Payer: Medicare HMO

## 2020-03-23 DIAGNOSIS — Z0000 Encounter for general adult medical examination without abnormal findings: Secondary | ICD-10-CM

## 2020-03-23 NOTE — Progress Notes (Signed)
Subjective:   Tommy Jackson is a 65 y.o. male who presents for an Initial Medicare Annual Wellness Visit.  Attempted video visit multiple and was unable to establish a connection. Call changed to a telephone only visit.  I connected with Tommy Jackson  today by telephone and verified that I am speaking with the correct person using two identifiers. Location patient: home Location provider: work Persons participating in the virtual visit: patient, provider.   I discussed the limitations, risks, security and privacy concerns of performing an evaluation and management service by telephone and the availability of in person appointments. I also discussed with the patient that there may be a patient responsible charge related to this service. The patient expressed understanding and verbally consented to this telephonic visit.    Interactive audio and video telecommunications were attempted between this provider and patient, however failed, due to patient having technical difficulties OR patient did not have access to video capability.  We continued and completed visit with audio only.      Review of Systems    N/A  Cardiac Risk Factors include: advanced age (>77men, >34 women);male gender;dyslipidemia;hypertension;obesity (BMI >30kg/m2)     Objective:    Today's Vitals   03/23/20 1134  PainSc: 4    There is no height or weight on file to calculate BMI.  Advanced Directives 03/23/2020 05/02/2016 04/29/2015  Does Patient Have a Medical Advance Directive? No Yes Yes  Type of Advance Directive - Living will Living will;Healthcare Power of Attorney  Does patient want to make changes to medical advance directive? No - Patient declined - -    Current Medications (verified) Outpatient Encounter Medications as of 03/23/2020  Medication Sig  . albuterol (VENTOLIN HFA) 108 (90 Base) MCG/ACT inhaler Inhale 2 puffs into the lungs every 4 (four) hours as needed.  Marland Kitchen amLODipine (NORVASC) 10 MG  tablet TAKE 1 TABLET EVERY DAY  . Ascorbic Acid (VITAMIN C) 1000 MG tablet Take 1,000 mg by mouth daily.  Marland Kitchen azelastine (ASTELIN) 0.1 % nasal spray Place 2 sprays into both nostrils 2 (two) times daily. Use in each nostril as directed  . Cetirizine HCl (ZYRTEC PO) Take by mouth daily.  . fenofibrate (TRICOR) 145 MG tablet TAKE 1 TABLET (145 MG TOTAL) BY MOUTH DAILY.  . fluticasone (FLONASE) 50 MCG/ACT nasal spray Place 1 spray into both nostrils 2 (two) times daily.  . furosemide (LASIX) 20 MG tablet TAKE 1 TABLET EVERY DAY  . lidocaine (LIDODERM) 5 % Apply 1-3 patches to painful area for up to 12 hours per day  . meloxicam (MOBIC) 15 MG tablet Take 15 mg by mouth daily.  . metoprolol tartrate (LOPRESSOR) 100 MG tablet TAKE 1 TABLET TWICE DAILY  . Multiple Vitamin (MULTIVITAMIN) capsule Take 1 capsule by mouth daily.  . NON FORMULARY   . potassium chloride (KLOR-CON) 10 MEQ tablet TAKE 1 TABLET EVERY DAY  . pravastatin (PRAVACHOL) 40 MG tablet TAKE 1 TABLET (40 MG TOTAL) BY MOUTH DAILY.  . valsartan-hydrochlorothiazide (DIOVAN-HCT) 320-25 MG tablet TAKE 1 TABLET EVERY DAY  . [DISCONTINUED] Melatonin-Pyridoxine 3-1 MG TABS Take by mouth.   No facility-administered encounter medications on file as of 03/23/2020.    Allergies (verified) Aspirin, Glycerin, Penicillins, and Metformin and related   History: Past Medical History:  Diagnosis Date  . Allergy   . ASTHMA 09/07/2009  . Herniated disc   . HYPERLIPIDEMIA 02/07/2007  . HYPERTENSION 11/29/2005  . OBESITY 10/01/2007  . Sleep apnea    cpap  .  SLEEP APNEA, OBSTRUCTIVE 10/01/2007   Past Surgical History:  Procedure Laterality Date  . bicep surgery Right 2014  . COLONOSCOPY    . ELBOW SURGERY Right 2013  . POLYPECTOMY    . removal disc neck  2015  . SHOULDER SURGERY Right 1990   Family History  Problem Relation Age of Onset  . Colon cancer Father 35  . Lung cancer Father   . CAD Father   . Diabetes Father   . Heart attack  Father   . Colon cancer Brother 55  . Heart attack Paternal Grandfather   . CAD Brother   . Esophageal cancer Neg Hx   . Stomach cancer Neg Hx   . Rectal cancer Neg Hx    Social History   Socioeconomic History  . Marital status: Married    Spouse name: Not on file  . Number of children: 3  . Years of education: Not on file  . Highest education level: Not on file  Occupational History  . Occupation: retired Facilities manager term disability    Employer: RETIRED  Tobacco Use  . Smoking status: Never Smoker  . Smokeless tobacco: Never Used  Substance and Sexual Activity  . Alcohol use: Yes    Alcohol/week: 0.0 standard drinks    Comment: rare alcohol intake  . Drug use: No  . Sexual activity: Not on file  Other Topics Concern  . Not on file  Social History Narrative  . Not on file   Social Determinants of Health   Financial Resource Strain: Low Risk   . Difficulty of Paying Living Expenses: Not hard at all  Food Insecurity: No Food Insecurity  . Worried About Charity fundraiser in the Last Year: Never true  . Ran Out of Food in the Last Year: Never true  Transportation Needs: No Transportation Needs  . Lack of Transportation (Medical): No  . Lack of Transportation (Non-Medical): No  Physical Activity: Inactive  . Days of Exercise per Week: 0 days  . Minutes of Exercise per Session: 0 min  Stress: No Stress Concern Present  . Feeling of Stress : Not at all  Social Connections: Moderately Integrated  . Frequency of Communication with Friends and Family: More than three times a week  . Frequency of Social Gatherings with Friends and Family: More than three times a week  . Attends Religious Services: 1 to 4 times per year  . Active Member of Clubs or Organizations: No  . Attends Archivist Meetings: Never  . Marital Status: Married    Tobacco Counseling Counseling given: Not Answered   Clinical Intake:  Pre-visit preparation completed: Yes  Pain :  0-10 Pain Score: 4  Pain Type: Chronic pain Pain Location: Back (knee) Pain Orientation: Lower Pain Descriptors / Indicators: Aching Pain Onset: More than a month ago Pain Frequency: Constant Pain Relieving Factors: sititng in recliner chair  Pain Relieving Factors: sititng in recliner chair  Nutritional Risks: None Diabetes: No  How often do you need to have someone help you when you read instructions, pamphlets, or other written materials from your doctor or pharmacy?: 1 - Never What is the last grade level you completed in school?: 12th grade  Diabetic?No      Information entered by :: [employer] In your present state of health, do you have any difficulty performing the following activities: 03/23/2020  Hearing? N  Vision? N  Difficulty concentrating or making decisions? N  Walking  or climbing stairs? Y  Comment SOB  Dressing or bathing? N  Doing errands, shopping? N  Preparing Food and eating ? N  Using the Toilet? N  In the past six months, have you accidently leaked urine? N  Do you have problems with loss of bowel control? N  Managing your Medications? N  Managing your Finances? N  Housekeeping or managing your Housekeeping? N  Some recent data might be hidden    Patient Care Team: Dorothyann Peng, NP as PCP - General (Family Medicine) Myrle Sheng, MD as Referring Physician (Neurosurgery) Milas Kocher, NP as Nurse Practitioner (Nurse Practitioner)  Indicate any recent Medical Services you may have received from other than [facility] providers in the past year (date may be approximate).     Assessment:   This is a routine wellness examination for [place].  Hearing/Vision screen  Hearing Screening   125Hz  250Hz  500Hz  1000Hz  2000Hz  3000Hz  4000Hz  6000Hz  8000Hz   Right ear:           Left ear:           Vision Screening Comments: Patient states gets his eyes examined once per year   Dietary issues and exercise  activities discussed: Current Exercise Habits: The patient does not participate in regular exercise at present, Exercise limited by: orthopedic condition(s)  Goals    . Exercise 3x per week (30 min per time)    . Weight (lb) < 230 lb (104.3 kg)      Depression Screen PHQ 2/9 Scores 03/23/2020 03/25/2019 11/01/2016  PHQ - 2 Score 0 0 0  PHQ- 9 Score 0 - -    Fall Risk Fall Risk  03/23/2020 11/01/2016  Falls in the past year? 0 No  Number falls in past yr: 0 -  Injury with Fall? 0 -  Risk for fall due to : Orthopedic patient -  Follow up Falls evaluation completed;Falls prevention discussed -    FALL RISK PREVENTION PERTAINING TO THE HOME:  Any stairs in or around the home? No  If so, are there any without handrails? No  Home free of loose throw rugs in walkways, pet beds, electrical cords, etc? Yes  Adequate lighting in your home to reduce risk of falls? Yes   ASSISTIVE DEVICES UTILIZED TO PREVENT FALLS:  Life alert? No  Use of a cane, walker or w/c? No  Grab bars in the bathroom? Yes  Shower chair or bench in shower? No  Elevated toilet seat or a handicapped toilet? No     Cognitive Function:   Normal cognitive status assessed by direct observation by this Nurse Health Advisor. No abnormalities found.        Immunizations Immunization History  Administered Date(s) Administered  . Influenza Split 02/05/2012  . Influenza Whole 10/27/2009  . Influenza,inj,Quad PF,6+ Mos 11/11/2014, 11/04/2015, 11/02/2017, 03/25/2019  . Pneumococcal Polysaccharide-23 02/05/2012  . Td 06/08/2009    TDAP status: Due, Education has been provided regarding the importance of this vaccine. Advised may receive this vaccine at local pharmacy or Health Dept. Aware to provide a copy of the vaccination record if obtained from local pharmacy or Health Dept. Verbalized acceptance and understanding.  Flu Vaccine status: Up to date  Pneumococcal vaccine status: Up to date  Covid-19 vaccine status:  Declined, Education has been provided regarding the importance of this vaccine but patient still declined. Advised may receive this vaccine at local pharmacy or Health Dept.or vaccine clinic. Aware to provide a copy of the vaccination record  if obtained from local pharmacy or Health Dept. Verbalized acceptance and understanding.  Qualifies for Shingles Vaccine? Yes   Zostavax completed No   Shingrix Completed?: No.    Education has been provided regarding the importance of this vaccine. Patient has been advised to call insurance company to determine out of pocket expense if they have not yet received this vaccine. Advised may also receive vaccine at local pharmacy or Health Dept. Verbalized acceptance and understanding.  Screening Tests Health Maintenance  Topic Date Due  . COVID-19 Vaccine (1) Never done  . TETANUS/TDAP  06/09/2019  . INFLUENZA VACCINE  09/28/2019  . COLONOSCOPY (Pts 45-3yrs Insurance coverage will need to be confirmed)  08/29/2023  . Hepatitis C Screening  Completed  . HIV Screening  Completed    Health Maintenance  Health Maintenance Due  Topic Date Due  . COVID-19 Vaccine (1) Never done  . TETANUS/TDAP  06/09/2019  . INFLUENZA VACCINE  09/28/2019    Colorectal cancer screening: Type of screening: Colonoscopy. Completed 08/29/2018. Repeat every 5 years  Lung Cancer Screening: (Low Dose CT Chest recommended if Age 84-80 years, 30 pack-year currently smoking OR have quit w/in 15years.) does not qualify.   Lung Cancer Screening Referral: N/A   Additional Screening:  Hepatitis C Screening: does qualify; Completed 11/02/2017  Vision Screening: Recommended annual ophthalmology exams for early detection of glaucoma and other disorders of the eye. Is the patient up to date with their annual eye exam?  Yes  Who is the provider or what is the name of the office in which the patient attends annual eye exams? Dr. Amalia Hailey  If pt is not established with a provider, would  they like to be referred to a provider to establish care? No .   Dental Screening: Recommended annual dental exams for proper oral hygiene  Community Resource Referral / Chronic Care Management: CRR required this visit?  No   CCM required this visit?  No      Plan:     I have personally reviewed and noted the following in the patient's chart:   . Medical and social history . Use of alcohol, tobacco or illicit drugs  . Current medications and supplements . Functional ability and status . Nutritional status . Physical activity . Advanced directives . List of other physicians . Hospitalizations, surgeries, and ER visits in previous 12 months . Vitals . Screenings to include cognitive, depression, and falls . Referrals and appointments  In addition, I have reviewed and discussed with patient certain preventive protocols, quality metrics, and best practice recommendations. A written personalized care plan for preventive services as well as general preventive health recommendations were provided to patient.     Ofilia Neas, LPN   [id number]   Nurse Notes: None

## 2020-03-23 NOTE — Patient Instructions (Signed)
Tommy Jackson , Thank you for taking time to come for your Medicare Wellness Visit. I appreciate your ongoing commitment to your health goals. Please review the following plan we discussed and let me know if I can assist you in the future.   Screening recommendations/referrals: Colonoscopy: Up to date, next due 09/15/2023 Recommended yearly ophthalmology/optometry visit for glaucoma screening and checkup Recommended yearly dental visit for hygiene and checkup  Vaccinations: Influenza vaccine: Currently due, if you wish to receive we recommend that you do so at your local pharmacy  Pneumococcal vaccine: not currently due at this age  Tdap vaccine: Currently due, if you wish to receive we recommend that you contact your insurance to discuss cost or await and injury to receive  Shingles vaccine: Currently due for shingrix, if you wish to receive we recommend that you do so at your local pharmacy as it is less expensive     Advanced directives: Advance directive discussed with you today. Even though you declined this today please call our office should you change your mind and we can give you the proper paperwork for you to fill out.   Conditions/risks identified: None   Next appointment: None   Preventive Care 40-64 Years, Male Preventive care refers to lifestyle choices and visits with your health care provider that can promote health and wellness. What does preventive care include?  A yearly physical exam. This is also called an annual well check.  Dental exams once or twice a year.  Routine eye exams. Ask your health care provider how often you should have your eyes checked.  Personal lifestyle choices, including:  Daily care of your teeth and gums.  Regular physical activity.  Eating a healthy diet.  Avoiding tobacco and drug use.  Limiting alcohol use.  Practicing safe sex.  Taking low-dose aspirin every day starting at age 81. What happens during an annual well  check? The services and screenings done by your health care provider during your annual well check will depend on your age, overall health, lifestyle risk factors, and family history of disease. Counseling  Your health care provider may ask you questions about your:  Alcohol use.  Tobacco use.  Drug use.  Emotional well-being.  Home and relationship well-being.  Sexual activity.  Eating habits.  Work and work Statistician. Screening  You may have the following tests or measurements:  Height, weight, and BMI.  Blood pressure.  Lipid and cholesterol levels. These may be checked every 5 years, or more frequently if you are over 42 years old.  Skin check.  Lung cancer screening. You may have this screening every year starting at age 70 if you have a 30-pack-year history of smoking and currently smoke or have quit within the past 15 years.  Fecal occult blood test (FOBT) of the stool. You may have this test every year starting at age 64.  Flexible sigmoidoscopy or colonoscopy. You may have a sigmoidoscopy every 5 years or a colonoscopy every 10 years starting at age 42.  Prostate cancer screening. Recommendations will vary depending on your family history and other risks.  Hepatitis C blood test.  Hepatitis B blood test.  Sexually transmitted disease (STD) testing.  Diabetes screening. This is done by checking your blood sugar (glucose) after you have not eaten for a while (fasting). You may have this done every 1-3 years. Discuss your test results, treatment options, and if necessary, the need for more tests with your health care provider. Vaccines  Your health  care provider may recommend certain vaccines, such as:  Influenza vaccine. This is recommended every year.  Tetanus, diphtheria, and acellular pertussis (Tdap, Td) vaccine. You may need a Td booster every 10 years.  Zoster vaccine. You may need this after age 53.  Pneumococcal 13-valent conjugate (PCV13)  vaccine. You may need this if you have certain conditions and have not been vaccinated.  Pneumococcal polysaccharide (PPSV23) vaccine. You may need one or two doses if you smoke cigarettes or if you have certain conditions. Talk to your health care provider about which screenings and vaccines you need and how often you need them. This information is not intended to replace advice given to you by your health care provider. Make sure you discuss any questions you have with your health care provider. Document Released: 03/12/2015 Document Revised: 11/03/2015 Document Reviewed: 12/15/2014 Elsevier Interactive Patient Education  2017 [place] Prevention in the Home Falls can cause injuries. They can happen to people of all ages. There are many things you can do to make your home safe and to help prevent falls. What can I do on the outside of my home?  Regularly fix the edges of walkways and driveways and fix any cracks.  Remove anything that might make you trip as you walk through a door, such as a raised step or threshold.  Trim any bushes or trees on the path to your home.  Use bright outdoor lighting.  Clear any walking paths of anything that might make someone trip, such as rocks or tools.  Regularly check to see if handrails are loose or broken. Make sure that both sides of any steps have handrails.  Any raised decks and porches should have guardrails on the edges.  Have any leaves, snow, or ice cleared regularly.  Use sand or salt on walking paths during winter.  Clean up any spills in your garage right away. This includes oil or grease spills. What can I do in the bathroom?  Use night lights.  Install grab bars by the toilet and in the tub and shower. Do not use towel bars as grab bars.  Use non-skid mats or decals in the tub or shower.  If you need to sit down in the shower, use a plastic, non-slip stool.  Keep the floor dry. Clean up any water that spills on  the floor as soon as it happens.  Remove soap buildup in the tub or shower regularly.  Attach bath mats securely with double-sided non-slip rug tape.  Do not have throw rugs and other things on the floor that can make you trip. What can I do in the bedroom?  Use night lights.  Make sure that you have a light by your bed that is easy to reach.  Do not use any sheets or blankets that are too big for your bed. They should not hang down onto the floor.  Have a firm chair that has side arms. You can use this for support while you get dressed.  Do not have throw rugs and other things on the floor that can make you trip. What can I do in the kitchen?  Clean up any spills right away.  Avoid walking on wet floors.  Keep items that you use a lot in easy-to-reach places.  If you need to reach something above you, use a strong step stool that has a grab bar.  Keep electrical cords out of the way.  Do not use floor polish or wax  that makes floors slippery. If you must use wax, use non-skid floor wax.  Do not have throw rugs and other things on the floor that can make you trip. What can I do with my stairs?  Do not leave any items on the stairs.  Make sure that there are handrails on both sides of the stairs and use them. Fix handrails that are broken or loose. Make sure that handrails are as long as the stairways.  Check any carpeting to make sure that it is firmly attached to the stairs. Fix any carpet that is loose or worn.  Avoid having throw rugs at the top or bottom of the stairs. If you do have throw rugs, attach them to the floor with carpet tape.  Make sure that you have a light switch at the top of the stairs and the bottom of the stairs. If you do not have them, ask someone to add them for you. What else can I do to help prevent falls?  Wear shoes that:  Do not have high heels.  Have rubber bottoms.  Are comfortable and fit you well.  Are closed at the toe. Do not  wear sandals.  If you use a stepladder:  Make sure that it is fully opened. Do not climb a closed stepladder.  Make sure that both sides of the stepladder are locked into place.  Ask someone to hold it for you, if possible.  Clearly mark and make sure that you can see:  Any grab bars or handrails.  First and last steps.  Where the edge of each step is.  Use tools that help you move around (mobility aids) if they are needed. These include:  Canes.  Walkers.  Scooters.  Crutches.  Turn on the lights when you go into a dark area. Replace any light bulbs as soon as they burn out.  Set up your furniture so you have a clear path. Avoid moving your furniture around.  If any of your floors are uneven, fix them.  If there are any pets around you, be aware of where they are.  Review your medicines with your doctor. Some medicines can make you feel dizzy. This can increase your chance of falling. Ask your doctor what other things that you can do to help prevent falls. This information is not intended to replace advice given to you by your health care provider. Make sure you discuss any questions you have with your health care provider. Document Released: 12/10/2008 Document Revised: 07/22/2015 Document Reviewed: 03/20/2014 Elsevier Interactive Patient Education  2017 [employer].

## 2020-04-05 IMAGING — DX DG WRIST COMPLETE 3+V*L*
4 series · 4 of 4 positions shown · non-contrast
Comparison: None.

CLINICAL DATA: Left wrist pain, no injury

EXAM:
LEFT WRIST - COMPLETE 3+ VIEW

[wrist pa (1 of 2)]
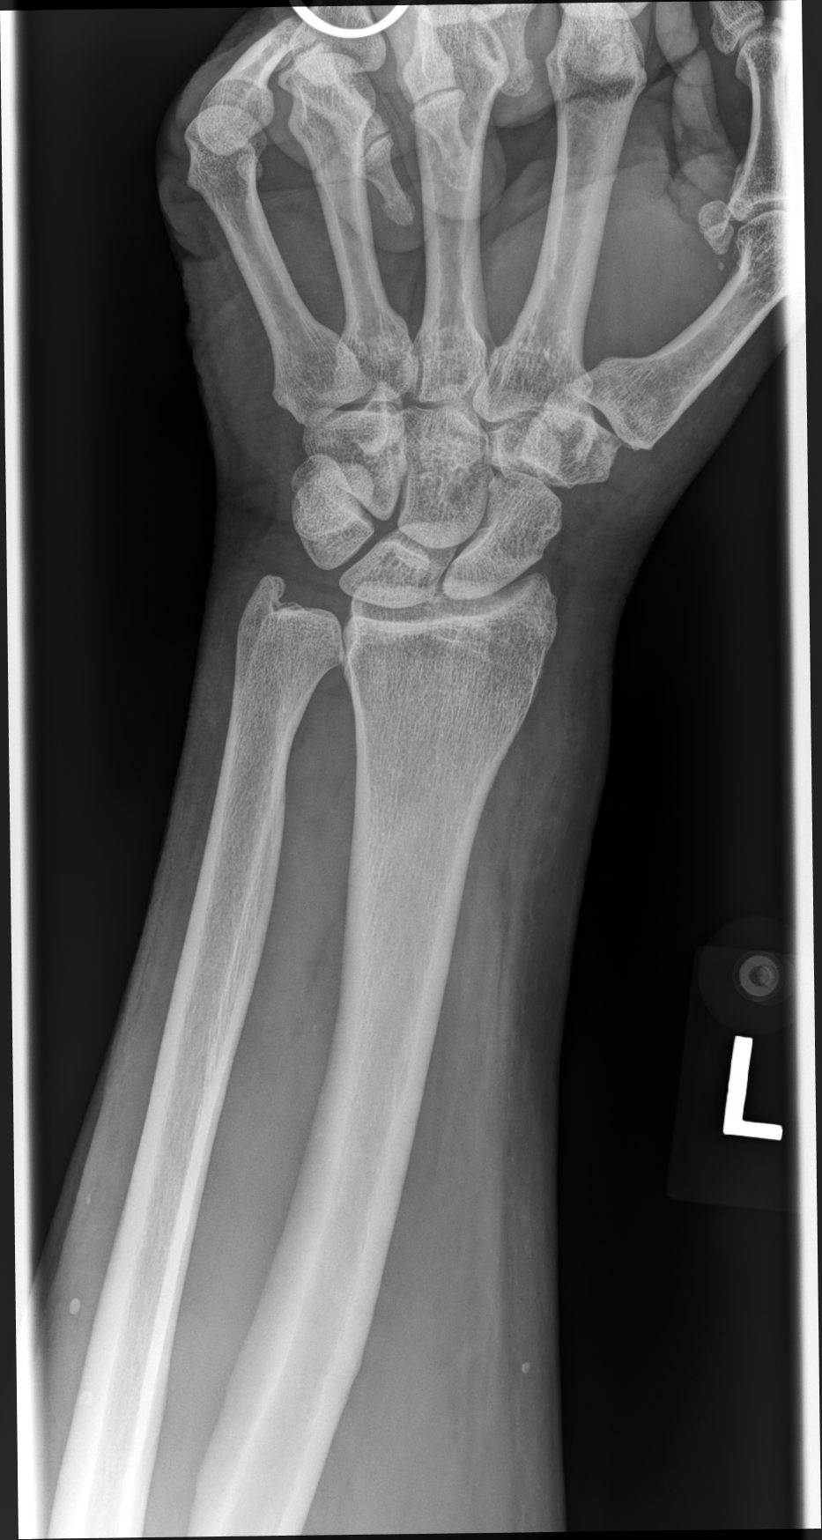

[wrist mlo]
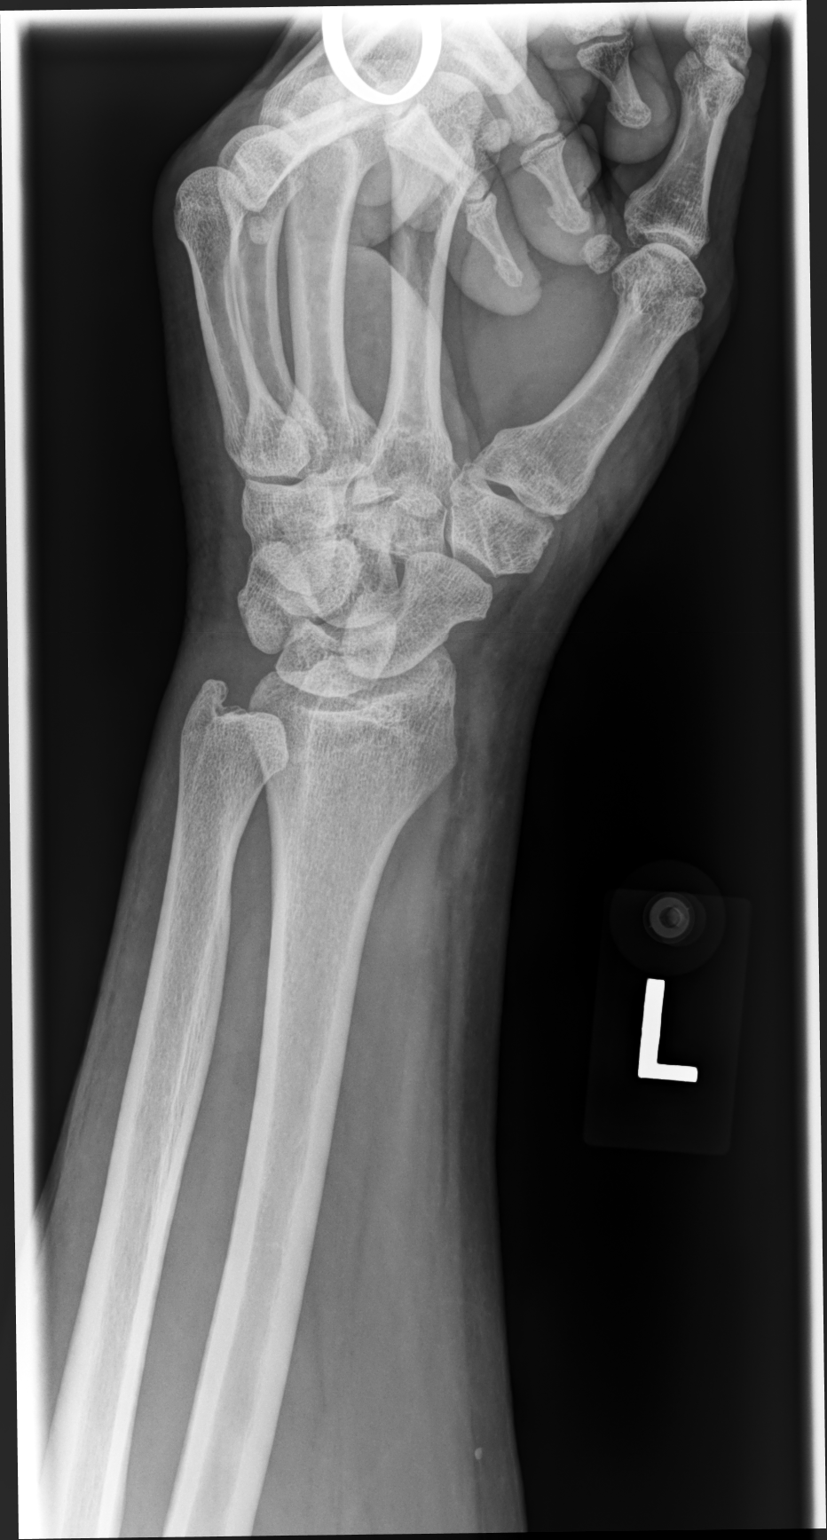

[wrist lat]
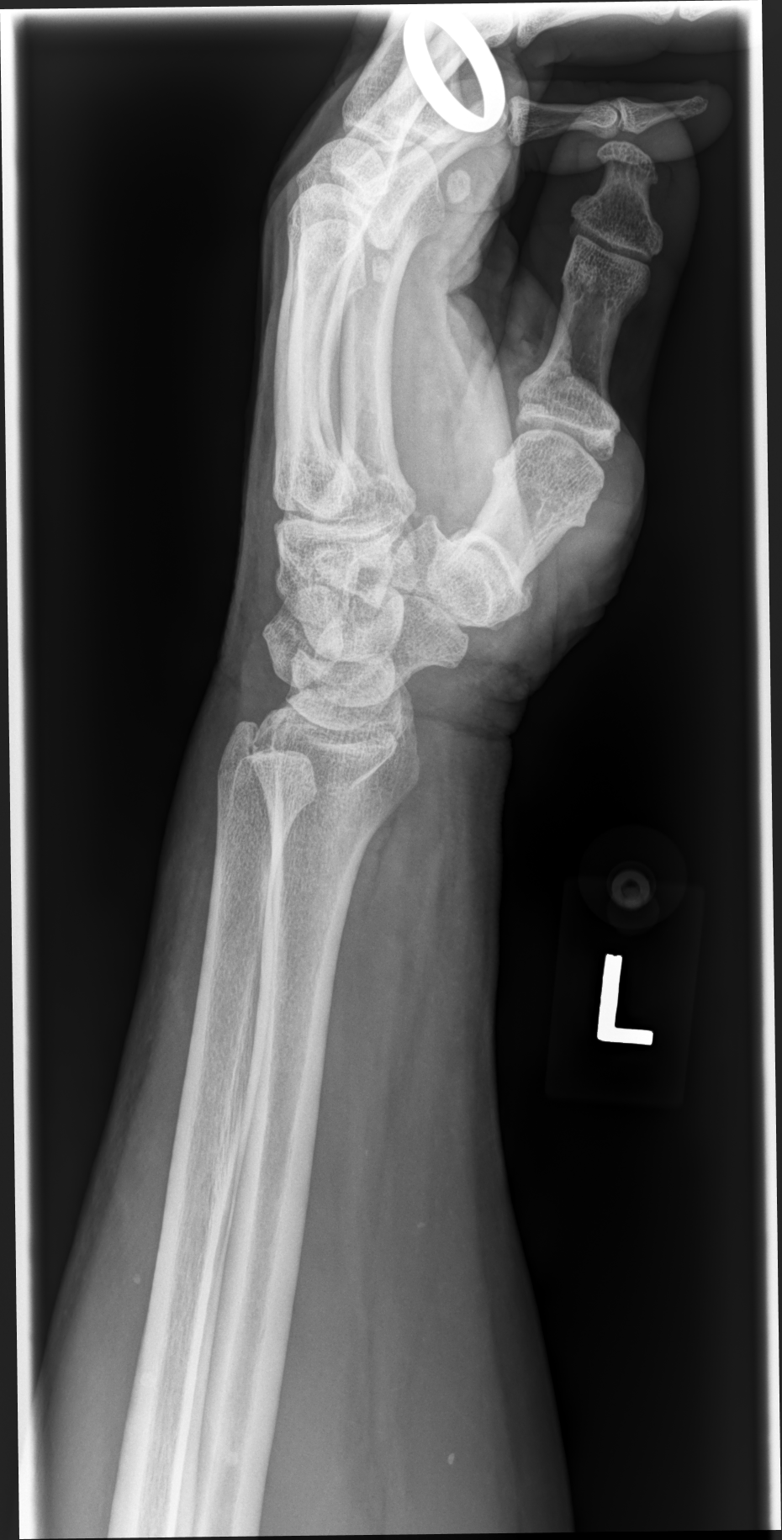

[wrist pa (2 of 2)]
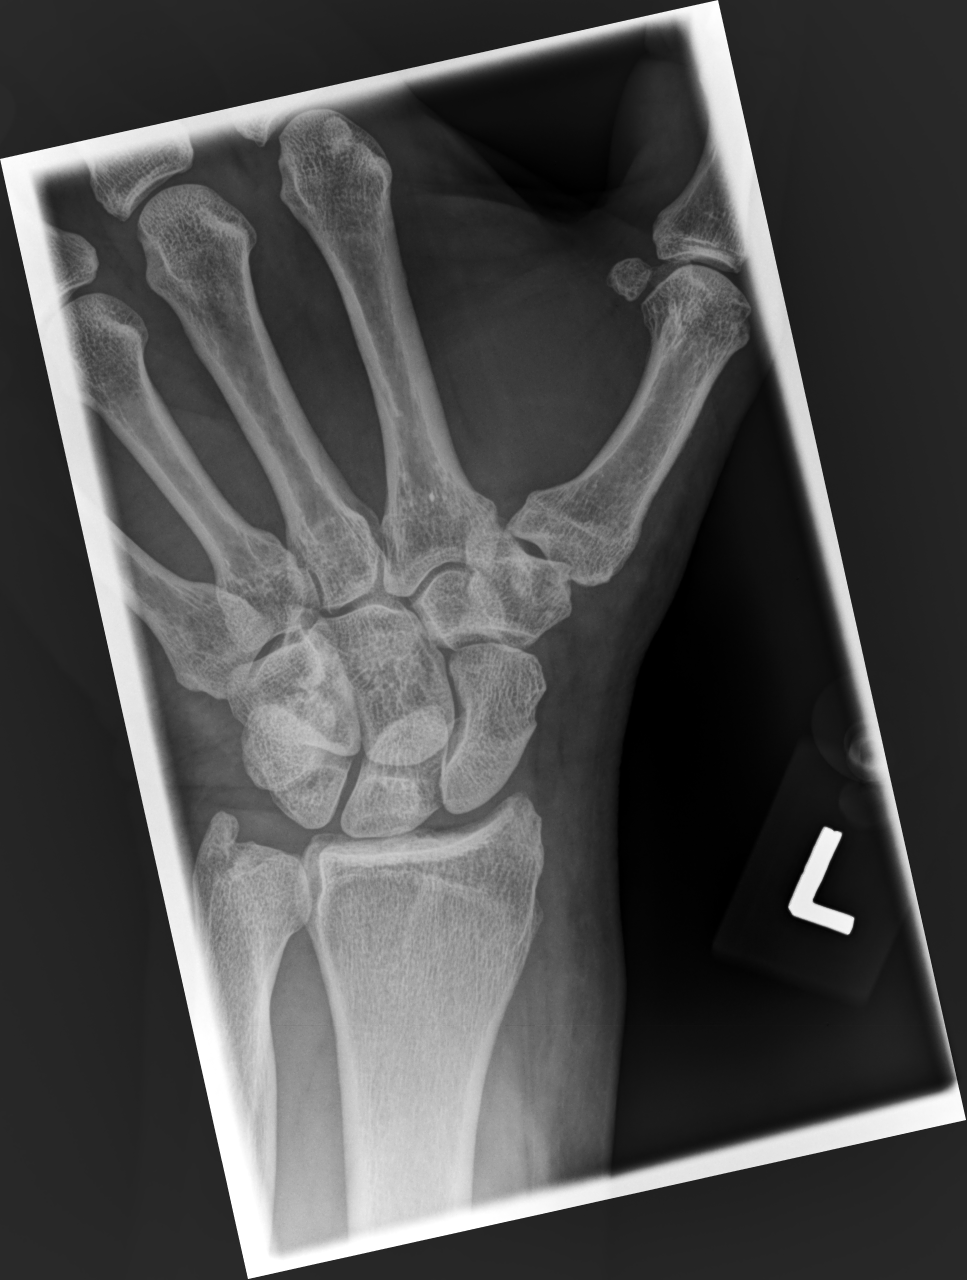

[4 of 4 positions shown; findings below may reference images not displayed]

FINDINGS: The left radiocarpal joint space appears normal and the ulnar
styloid is intact. The carpal bones are normal position with normal
alignment. Intercarpal joint spaces appear normal. There is mild
degenerative change of the left first CMC joint.
IMPRESSION: No acute abnormality. Normal alignment. Mild degenerative change of
the left first CMC joint.

## 2020-05-24 IMAGING — DX DG SACRUM/COCCYX 2+V
3 series · 3 of 3 positions shown · non-contrast
Comparison: None.

CLINICAL DATA: Sacral back pain.  Fall

EXAM:
SACRUM AND COCCYX - 2+ VIEW

[sacrum 20° caudo-cranial ap]
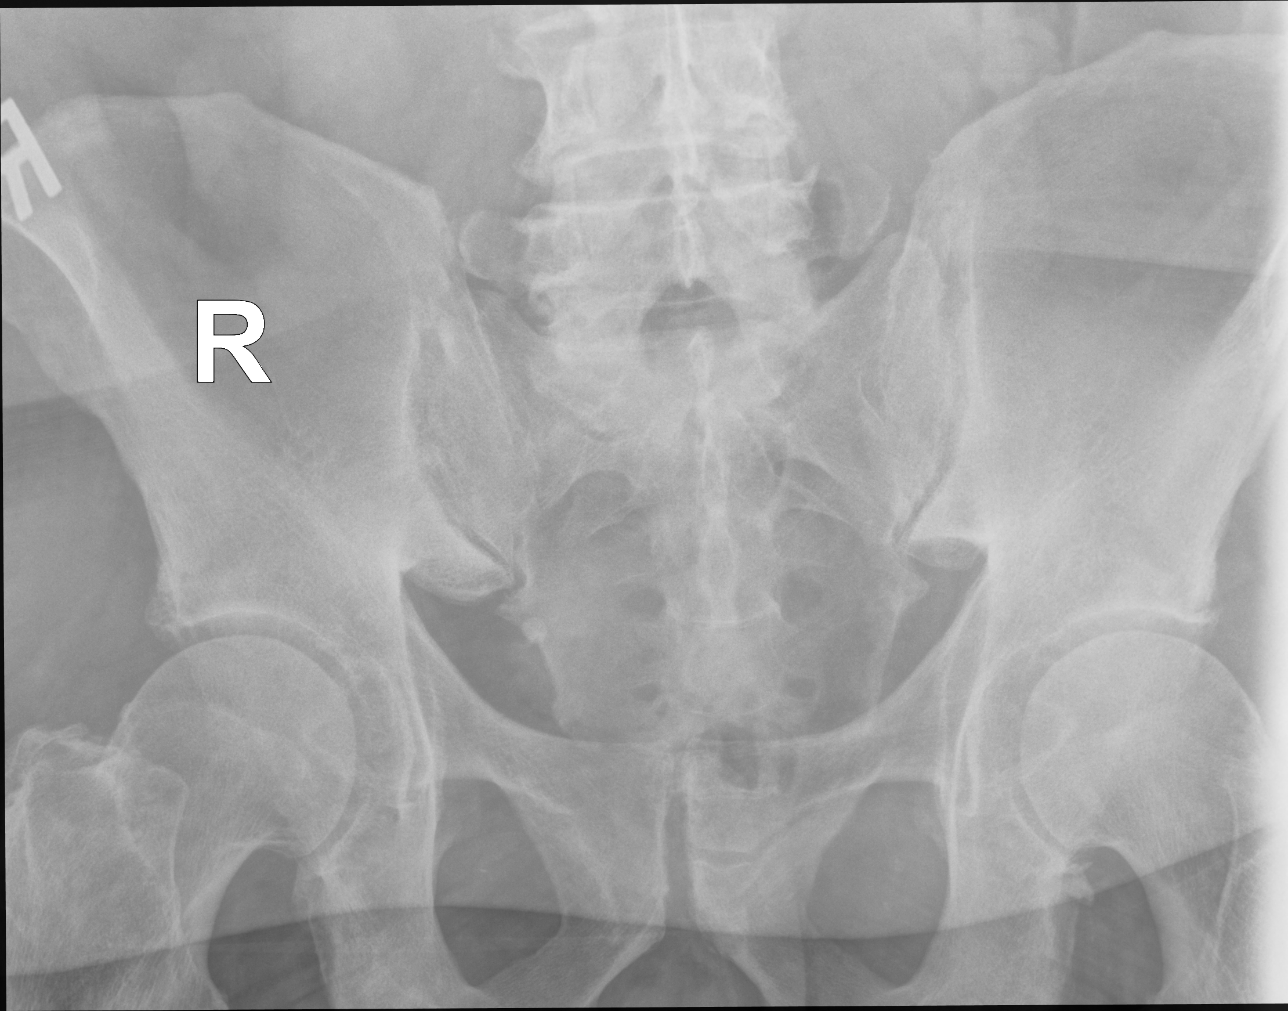

[coccyx 20° cranio-caudal ap]
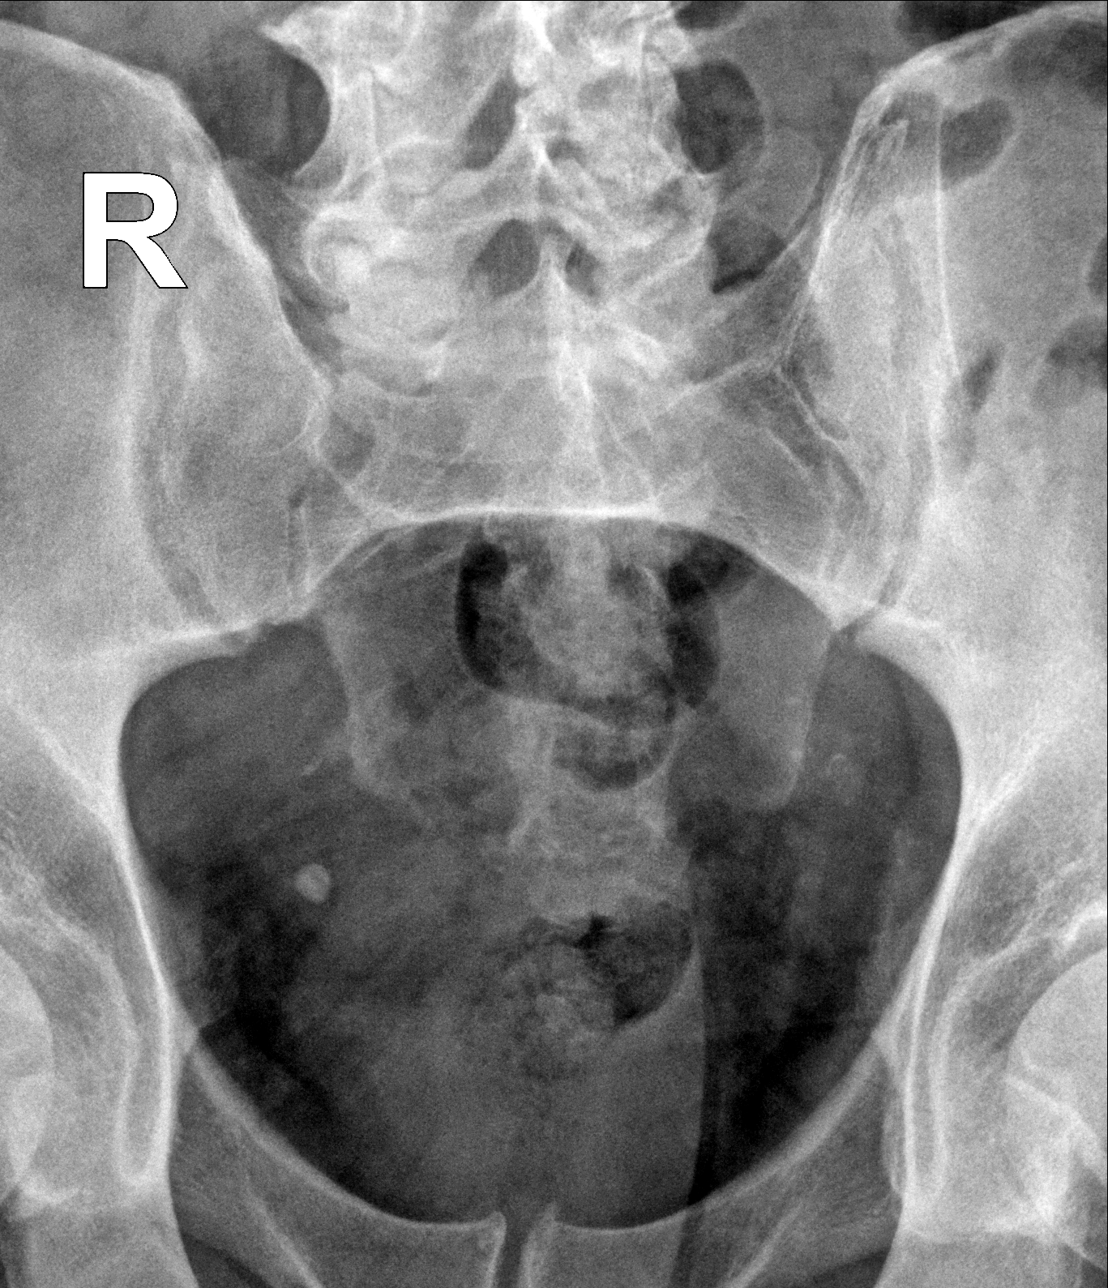

[sacrum lat]
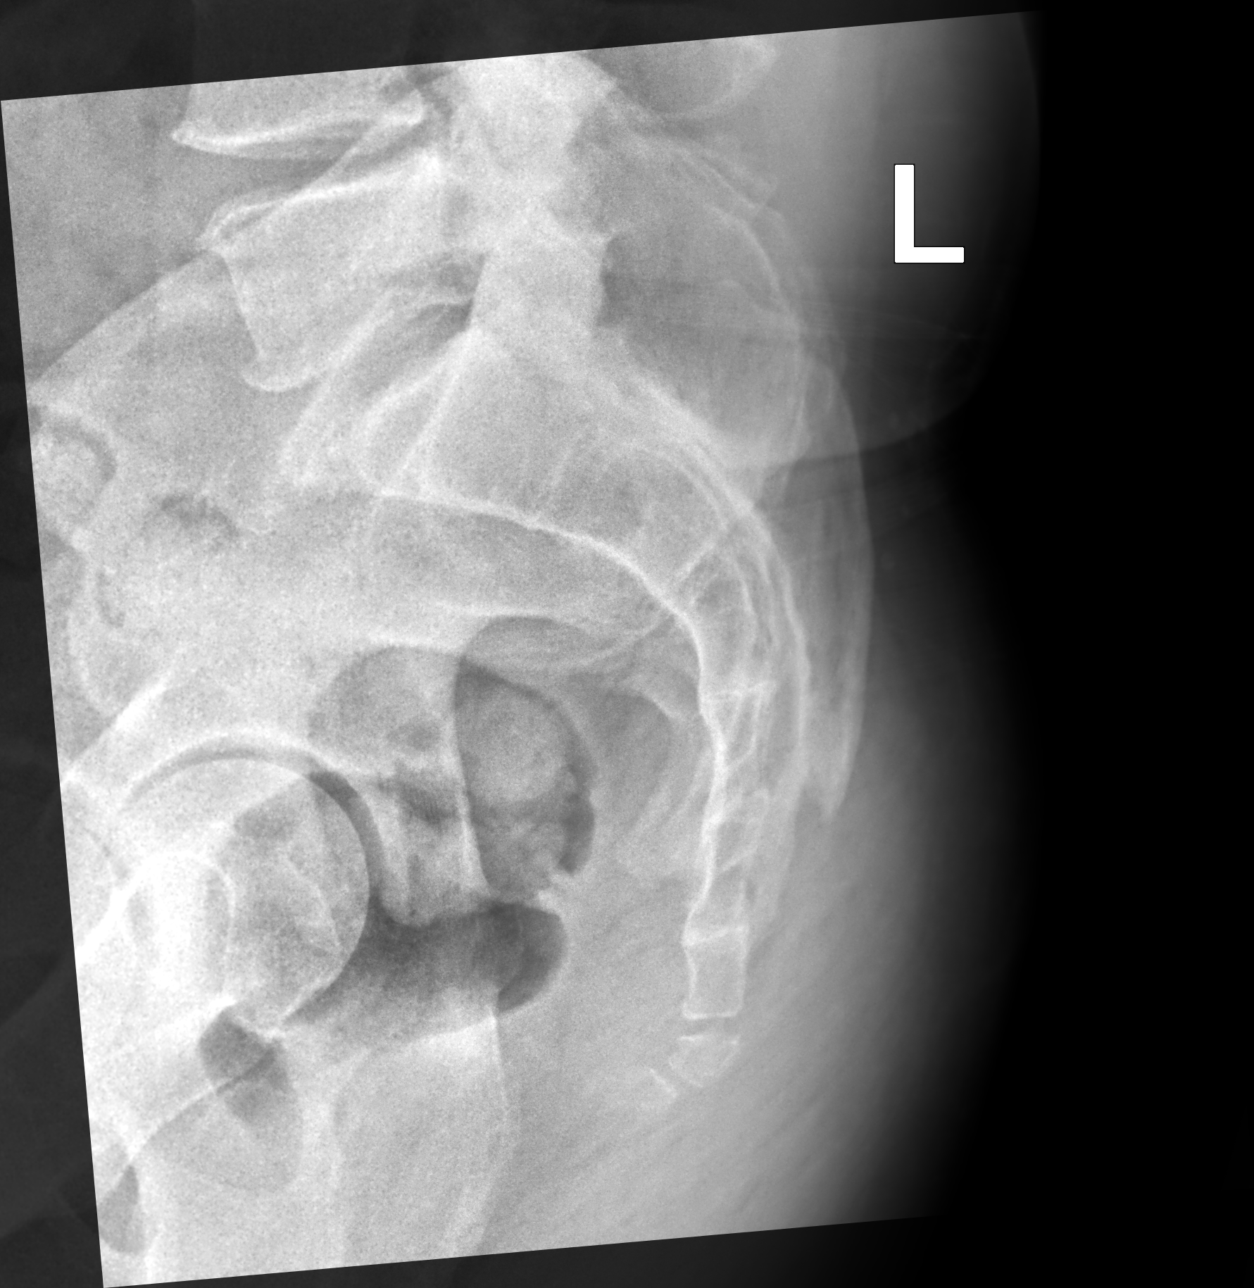

[3 of 3 positions shown; findings below may reference images not displayed]

FINDINGS: There is no evidence of fracture or other focal bone lesions.
IMPRESSION: Negative.

## 2020-05-26 ENCOUNTER — Other Ambulatory Visit: Payer: Self-pay | Admitting: Adult Health

## 2020-05-26 ENCOUNTER — Encounter: Payer: Self-pay | Admitting: Adult Health

## 2020-05-26 DIAGNOSIS — M159 Polyosteoarthritis, unspecified: Secondary | ICD-10-CM

## 2020-05-26 DIAGNOSIS — M8949 Other hypertrophic osteoarthropathy, multiple sites: Secondary | ICD-10-CM

## 2020-06-02 ENCOUNTER — Ambulatory Visit: Payer: Self-pay

## 2020-06-02 ENCOUNTER — Encounter: Payer: Self-pay | Admitting: Physician Assistant

## 2020-06-02 ENCOUNTER — Ambulatory Visit (INDEPENDENT_AMBULATORY_CARE_PROVIDER_SITE_OTHER): Payer: Medicare HMO

## 2020-06-02 ENCOUNTER — Ambulatory Visit: Payer: Medicare HMO | Admitting: Physician Assistant

## 2020-06-02 VITALS — Ht 72.5 in | Wt 334.6 lb

## 2020-06-02 DIAGNOSIS — M25552 Pain in left hip: Secondary | ICD-10-CM | POA: Diagnosis not present

## 2020-06-02 DIAGNOSIS — M25551 Pain in right hip: Secondary | ICD-10-CM

## 2020-06-02 DIAGNOSIS — M7062 Trochanteric bursitis, left hip: Secondary | ICD-10-CM | POA: Diagnosis not present

## 2020-06-02 DIAGNOSIS — M7061 Trochanteric bursitis, right hip: Secondary | ICD-10-CM

## 2020-06-02 MED ORDER — LIDOCAINE HCL 1 % IJ SOLN
3.0000 mL | INTRAMUSCULAR | Status: AC | PRN
  Administered 2020-06-02: 3 mL

## 2020-06-02 MED ORDER — METHYLPREDNISOLONE ACETATE 40 MG/ML IJ SUSP
40.0000 mg | INTRAMUSCULAR | Status: AC | PRN
Start: 2020-06-02 — End: 2020-06-02
  Administered 2020-06-02: 40 mg via INTRA_ARTICULAR

## 2020-06-02 NOTE — Progress Notes (Signed)
Office Visit Note   Patient: Tommy Jackson           Date of Birth: 1955/05/31           MRN: [id number] Visit Date: 06/02/2020              Requested by: Tommy Peng, NP Tommy Jackson,  Tommy Jackson [id number] PCP: Tommy Peng, NP   Assessment & Plan: Visit Diagnoses:  1. Pain in left hip   2. Pain in right hip   3. Trochanteric bursitis, left hip   4. Trochanteric bursitis, right hip     Plan: Due to patient's prediabetes would recommend only 1 injection today.  We will start with a right hip trochanteric injection which he was agreeable with.  Like to see how this affects his pain.  We will see him back in 2 weeks to see how he has progressed.  Did show him IT band stretching exercises.  Follow-Up Instructions: Return in about 2 weeks (around 06/16/2020).   Orders:  Orders Placed This Encounter  Procedures  . Large Joint Inj: R greater trochanter  . XR HIP UNILAT W OR W/O PELVIS 2-3 VIEWS LEFT  . XR HIP UNILAT W OR W/O PELVIS 2-3 VIEWS RIGHT   No orders of the defined types were placed in this encounter.     Procedures: Large Joint Inj: R greater trochanter on 06/02/2020 5:50 PM Indications: pain Details: 22 G 1.5 in needle, lateral approach  Arthrogram: No  Medications: 3 mL lidocaine 1 %; 40 mg methylPREDNISolone acetate 40 MG/ML Outcome: tolerated well, no immediate complications Procedure, treatment alternatives, risks and benefits explained, specific risks discussed. Consent was given by the patient. Immediately prior to procedure a time out was called to verify the correct patient, procedure, equipment, support staff and site/side marked as required. Patient was prepped and draped in the usual sterile fashion.       Clinical Data: No additional findings.   Subjective: Chief Complaint  Patient presents with  . Left Hip - Pain  . Right Hip - Pain    HPI  Tommy Jackson is a 65 year old who comes in today with bilateral hip pain.  Pain  is worse on the lateral aspect of both hips and in the upper gluteus region.  He does state he has some groin pain right greater than left.  He states pain is worse if he is sitting for too long best if he is standing.  He does not have problems donning shoes and socks more so on the right than the left.  He is using Lidoderm patches on the lateral aspect of both hips.  He also takes meloxicam.  He states he had a cortisone injection sounds as if he had a trochanteric injection but he does not know on which side several years ago and this helped some.  He notes that he has been diagnosed with lumbar disc herniation affecting both legs.  He denies any numbness tingling in either leg.  He is "prediabetic" last hemoglobin A1c was around 6.1.  Review of Systems Negative for fevers or chills.   Objective: Vital Signs: Ht 6' 0.5" (1.842 m)   Wt (!) 334 lb 9.6 oz (151.8 kg)   BMI 44.76 kg/m   Physical Exam Constitutional:      Appearance: He is not ill-appearing or diaphoretic.  Pulmonary:     Effort: Pulmonary effort is normal.  Neurological:     Mental Status: He is oriented  to person, place, and time.  Psychiatric:        Mood and Affect: Mood normal.     Ortho Exam Bilateral hips no pain with logrolling internally and externally.  However extremes of internal and external rotation of the hips: Pain with groin and lateral aspect of both hips.  He has guarding with internal rotation of the right hip pain.  Tenderness over the trochanteric region of both hips. Specialty Comments:  No specialty comments available.  Imaging: XR HIP UNILAT W OR W/O PELVIS 2-3 VIEWS LEFT  Result Date: 06/02/2020 AP pelvis lateral view left hip: Left hip is well located.  Hip is well-preserved no acute fractures.  XR HIP UNILAT W OR W/O PELVIS 2-3 VIEWS RIGHT  Result Date: 06/02/2020 AP pelvis lateral view of the right hip: Both hips are well located.  Slight narrowing of the right hip joint.  Otherwise no  acute fractures no bony abnormalities.    PMFS History: Patient Active Problem List   Diagnosis Date Noted  . Prediabetes 02/18/2015  . Left leg pain 09/01/2013  . Left knee pain 09/01/2013  . Left hip pain 08/04/2013  . Allergic rhinitis 04/09/2013  . Other abnormal glucose 10/24/2012  . Dermatitis 05/07/2012  . Cervical radiculopathy 02/22/2012  . HERNIATED DISC 05/11/2010  . Asthma 09/07/2009  . Obesity 10/01/2007  . Obstructive sleep apnea 10/01/2007  . Dyslipidemia 02/07/2007  . Essential hypertension 11/29/2005   Past Medical History:  Diagnosis Date  . Allergy   . ASTHMA 09/07/2009  . Herniated disc   . HYPERLIPIDEMIA 02/07/2007  . HYPERTENSION 11/29/2005  . OBESITY 10/01/2007  . Sleep apnea    cpap  . SLEEP APNEA, OBSTRUCTIVE 10/01/2007    Family History  Problem Relation Age of Onset  . Colon cancer Father 17  . Lung cancer Father   . CAD Father   . Diabetes Father   . Heart attack Father   . Colon cancer Brother 66  . Heart attack Paternal Grandfather   . CAD Brother   . Esophageal cancer Neg Hx   . Stomach cancer Neg Hx   . Rectal cancer Neg Hx     Past Surgical History:  Procedure Laterality Date  . bicep surgery Right 2014  . COLONOSCOPY    . ELBOW SURGERY Right 2013  . POLYPECTOMY    . removal disc neck  2015  . SHOULDER SURGERY Right 1990   Social History   Occupational History  . Occupation: retired Facilities manager term disability    Employer: RETIRED  Tobacco Use  . Smoking status: Never Smoker  . Smokeless tobacco: Never Used  Substance and Sexual Activity  . Alcohol use: Yes    Alcohol/week: 0.0 standard drinks    Comment: rare alcohol intake  . Drug use: No  . Sexual activity: Not on file

## 2020-06-16 ENCOUNTER — Other Ambulatory Visit: Payer: Self-pay

## 2020-06-16 ENCOUNTER — Encounter: Payer: Self-pay | Admitting: Orthopaedic Surgery

## 2020-06-16 ENCOUNTER — Ambulatory Visit: Payer: Medicare HMO | Admitting: Orthopaedic Surgery

## 2020-06-16 DIAGNOSIS — M7062 Trochanteric bursitis, left hip: Secondary | ICD-10-CM | POA: Diagnosis not present

## 2020-06-16 DIAGNOSIS — M7061 Trochanteric bursitis, right hip: Secondary | ICD-10-CM

## 2020-06-16 NOTE — Progress Notes (Signed)
The patient is being seen in follow-up for bilateral hip trochanteric bursitis with the right greater than left.  We only injected the right hip at his last visit over the trochanteric area due to him being a diabetic.  He has good diabetic control and does not check his blood glucose.  He said the injection helped that day but really did not help a whole lot.  He does report that his left hip is doing better.  He denies any groin pain.  I did review his x-rays from his last visit and he does not show any significant arthritis in either hip.  He does have a BMI of almost 45.  On exam both hips move smoothly and fluidly with pain over the trochanteric area only.  Of note he did have a recent road trip that did cause his hip to hurt more on the right side.  He would definitely benefit from formal outpatient physical therapy they can show him stretching and other modalities that they can try a therapy standpoint to decrease his trochanteric pain.  He is agreeable to trying outpatient physical therapy.  We will set that up and see him back in 6 weeks after course of the therapy.  All questions and concerns were answered and addressed.

## 2020-07-07 ENCOUNTER — Other Ambulatory Visit: Payer: Self-pay

## 2020-07-07 ENCOUNTER — Encounter: Payer: Self-pay | Admitting: Physical Therapy

## 2020-07-07 ENCOUNTER — Ambulatory Visit: Payer: Medicare HMO | Attending: Orthopaedic Surgery | Admitting: Physical Therapy

## 2020-07-07 DIAGNOSIS — M6281 Muscle weakness (generalized): Secondary | ICD-10-CM | POA: Diagnosis not present

## 2020-07-07 DIAGNOSIS — M25651 Stiffness of right hip, not elsewhere classified: Secondary | ICD-10-CM | POA: Diagnosis not present

## 2020-07-07 DIAGNOSIS — R2689 Other abnormalities of gait and mobility: Secondary | ICD-10-CM | POA: Insufficient documentation

## 2020-07-07 DIAGNOSIS — M25551 Pain in right hip: Secondary | ICD-10-CM | POA: Insufficient documentation

## 2020-07-07 DIAGNOSIS — M25652 Stiffness of left hip, not elsewhere classified: Secondary | ICD-10-CM | POA: Diagnosis not present

## 2020-07-07 DIAGNOSIS — M25552 Pain in left hip: Secondary | ICD-10-CM | POA: Insufficient documentation

## 2020-07-07 NOTE — Therapy (Signed)
[facility] [address]  [place], [place], [address] Phone: [phone]   Fax:  [phone]  Physical Therapy Evaluation  Patient Details  Name: Tommy Jackson MRN: [id number] Date of Birth: 02/14/1956 Referring Provider (PT): Jean Rosenthal, MD   Encounter Date: 07/07/2020   PT End of Session - 07/07/20 1150    Visit Number 1    Number of Visits 13    Date for PT Re-Evaluation 08/18/20    Authorization Type [employer]    PT Start Time 1101    PT Stop Time 1140    PT Time Calculation (min) 39 min    Activity Tolerance Patient tolerated treatment well;Patient limited by pain    Behavior During Therapy [facility] for tasks assessed/performed           Past Medical History:  Diagnosis Date  . Allergy   . ASTHMA 09/07/2009  . Herniated disc   . HYPERLIPIDEMIA 02/07/2007  . HYPERTENSION 11/29/2005  . OBESITY 10/01/2007  . Sleep apnea    cpap  . SLEEP APNEA, OBSTRUCTIVE 10/01/2007    Past Surgical History:  Procedure Laterality Date  . bicep surgery Right 2014  . COLONOSCOPY    . ELBOW SURGERY Right 2013  . POLYPECTOMY    . removal disc neck  2015  . SHOULDER SURGERY Right 1990    There were no vitals filed for this visit.    Subjective Assessment - 07/07/20 1103    Subjective Patient reporting R>L hip pain for the past 2 months. Reports issues with his hips in the past. Denies specific injury to his hips, but on disability from a back injury in 1990. Pain is located over the R lateral hip over the bone, sometimes radiating to the R groin. Worse with prolonged sitting/driving, first thing in the AM, standing, R sidelying. Better when sitting in recliner, Lidoderm patch. Reports intermittent N/T in B LEs, but this is not new for him and is being seen by MD for his back pain.    Pertinent History obesity, HTN, HLD, herniated disc, asthma, R biceps surgery 2014, R elbow surgery 2013, R shoulder surgery  1990    Limitations Sitting;Lifting;Standing;Walking;House hold activities    How long can you sit comfortably? 10-60 min varies based on seat    How long can you stand comfortably? unable to estimate- limited by LBP as well    How long can you walk comfortably? sometimes relieves pain    Diagnostic tests 06/02/20 B hip xray: Both hips are well located. Slight narrowing of the right hip joint. Otherwise no acute fractures no bony abnormalities. L Hip is   well-preserved no acute fractures.    Patient Stated Goals decrease pain    Currently in Pain? Yes    Pain Score 0-No pain    Pain Location Hip    Pain Orientation Right    Pain Descriptors / Indicators Aching    Pain Type Chronic pain;Acute pain    Pain Radiating Towards sometimes into groin    Multiple Pain Sites Yes    Pain Score 4    Pain Location Back    Pain Orientation Left;Right;Lower    Pain Descriptors / Indicators Aching    Pain Type Chronic pain              [facility] PT Assessment - 07/07/20 1111      Assessment   Medical Diagnosis R/L hip trochanteric bursitis    Referring Provider (PT)  Jean Rosenthal, MD    Onset Date/Surgical Date 05/07/20    Hand Dominance Right    Next MD Visit not scheduled    Prior Therapy yes      Precautions   Precautions None      Balance Screen   Has the patient fallen in the past 6 months No    Has the patient had a decrease in activity level because of a fear of falling?  No    Is the patient reluctant to leave their home because of a fear of falling?  No      Home Ecologist residence    Living Arrangements Spouse/significant other    Available Help at Discharge Family    Type of [place] entrance   +1 step to walk in   [place] One level    [place] - single point      Prior Function   Level of [place] On disability    Leisure none      Cognition   Overall  Cognitive Status Within Functional Limits for tasks assessed      Sensation   Light Touch Appears Intact      Coordination   Gross Motor Movements are Fluid and Coordinated Yes      Posture/Postural Control   Posture Comments wt shift L      ROM / Strength   AROM / PROM / Strength AROM;Strength      AROM   AROM Assessment Site Hip    Right/Left Hip Right;Left    Right Hip Flexion 94   "twinge" in back   Right Hip External Rotation  34    Right Hip Internal Rotation  15   groin discomfort   Left Hip Flexion 103    Left Hip External Rotation  30    Left Hip Internal Rotation  16      Strength   Strength Assessment Site Hip;Knee;Ankle    Right/Left Hip Right;Left    Right Hip Flexion 4/5    Right Hip ABduction 4/5    Right Hip ADduction 4/5    Left Hip Flexion 4/5    Left Hip ABduction 4/5    Left Hip ADduction 4/5    Right/Left Knee Right;Left    Right Knee Flexion 4/5    Right Knee Extension 4/5    Left Knee Flexion 4+/5    Left Knee Extension 4/5    Right/Left Ankle Right;Left    Right Ankle Dorsiflexion 5/5    Right Ankle Plantar Flexion 4+/5    Left Ankle Dorsiflexion 5/5    Left Ankle Plantar Flexion 4+/5      Flexibility   Soft Tissue Assessment /Muscle Length yes    ITB B positive and painful Ober's test    Piriformis able to cross B ankles over knees      Palpation   Palpation comment TTP over B PSIS, midline of L3-5, and R greater trochanter; mild soft tissue restriction in B buttocks      Ambulation/Gait   Assistive device None    Gait Pattern Step-through pattern;Lateral hip instability   severe R lateral trunk lean   Ambulation Surface Level;Indoor    Gait velocity WFL                      Objective measurements completed on examination: See above findings.  PT Education - 07/07/20 1150    Education Details prognosis, POC, HEP; answered patient's questions about using diet and exercise for weight loss     Person(s) Educated Patient    Methods Explanation;Demonstration;Tactile cues;Verbal cues;Handout    Comprehension Verbalized understanding;Returned demonstration            PT Short Term Goals - 07/07/20 1155      PT SHORT TERM GOAL #1   Title Patient to be independent with initial HEP.    Time 3    Period Weeks    Status New    Target Date 07/28/20             PT Long Term Goals - 07/07/20 1156      PT LONG TERM GOAL #1   Title Patient to be independent with advanced HEP.    Time 6    Period Weeks    Status New    Target Date 08/18/20      PT LONG TERM GOAL #2   Title Patient to demonstrate B LE strength >/=4+/5.    Time 6    Period Weeks    Status New    Target Date 08/18/20      PT LONG TERM GOAL #3   Title Patient to demonstrate B hip AROM WFL and without pain limiting.    Time 6    Period Weeks    Status New    Target Date 08/18/20      PT LONG TERM GOAL #4   Title Patient to report no pain in B hips with bed mobility.    Time 6    Period Weeks    Status New    Target Date 08/18/20      PT LONG TERM GOAL #5   Title Patient to report 80% improvement in hip pain.    Time 6    Period Weeks    Status New    Target Date 08/18/20                  Plan - 07/07/20 1151    Clinical Impression Statement Patient is a 65 y/o M presenting to OPPT with c/o acute insidious R>L hip pain for the past 2 months. Pain is located over the R lateral hip, sometimes radiating to the R groin. Worse with prolonged sitting/driving, first thing in the AM, standing, R sidelying. Patient notes additional hx of LBP which limits his functional activity tolerance; has been on disability for this since 1990. Patient today presenting with L weight shift in sitting, limited B hip AROM, decreased B hip and knee strength, B positive Ober's test, TTP over B PSIS, midline of L3-5, and R greater trochanter, mild soft tissue restriction in B buttocks, and gait deviations. Patient was  educated on gentle stretching and strengthening HEP- patient reported understanding. Would benefit from skilled PT services 2x/week for 6 weeks to address aforementioned impairments.    Personal Factors and Comorbidities Age;Comorbidity 3+;Past/Current Experience;Fitness;Time since onset of injury/illness/exacerbation    Comorbidities obesity, HTN, HLD, herniated disc, asthma, R biceps surgery 2014, R elbow surgery 2013, R shoulder surgery 1990    Examination-Activity Limitations Bathing;Bed Mobility;Bend;Carry;Hygiene/Grooming;Lift;Locomotion Level;Transfers;Toileting;Stand;Stairs;Squat;Sleep;Sit    Examination-Participation Restrictions Church;Cleaning;Community Activity;Driving;Meal Prep;Shop    Stability/Clinical Decision Making Stable/Uncomplicated    Clinical Decision Making Low    Rehab Potential Good    PT Frequency 2x / week    PT Duration 6 weeks    PT Treatment/Interventions ADLs/Self Care Home Management;Cryotherapy;Electrical Stimulation;Iontophoresis 4mg /ml  Dexamethasone;Moist Heat;Balance training;Therapeutic exercise;Therapeutic activities;Functional mobility training;Stair training;Gait training;Ultrasound;Neuromuscular re-education;Patient/family education;Manual techniques;Taping;Energy conservation;Dry needling;Passive range of motion    PT Next Visit Plan hip FOTO; reassess HEP, progress hip strengthening and LE stretching    Consulted and Agree with Plan of Care Patient           Patient will benefit from skilled therapeutic intervention in order to improve the following deficits and impairments:  Abnormal gait,Hypomobility,Decreased activity tolerance,Decreased strength,Increased fascial restricitons,Pain,Decreased balance,Difficulty walking,Increased muscle spasms,Improper body mechanics,Decreased range of motion,Impaired flexibility,Postural dysfunction  Visit Diagnosis: Pain in right hip  Stiffness of right hip, not elsewhere classified  Pain in left  hip  Stiffness of left hip, not elsewhere classified  Muscle weakness (generalized)  Other abnormalities of gait and mobility     Problem List Patient Active Problem List   Diagnosis Date Noted  . Prediabetes 02/18/2015  . Left leg pain 09/01/2013  . Left knee pain 09/01/2013  . Left hip pain 08/04/2013  . Allergic rhinitis 04/09/2013  . Other abnormal glucose 10/24/2012  . Dermatitis 05/07/2012  . Cervical radiculopathy 02/22/2012  . HERNIATED DISC 05/11/2010  . Asthma 09/07/2009  . Obesity 10/01/2007  . Obstructive sleep apnea 10/01/2007  . Dyslipidemia 02/07/2007  . Essential hypertension 11/29/2005     Janene Harvey, PT, DPT 07/07/20 12:00 PM   [facility] [address]  [place], [place], [address] Phone: [phone]   Fax:  [phone]  Name: Pauline Trainer MRN: [id number] Date of Birth: [birth year]

## 2020-07-14 ENCOUNTER — Ambulatory Visit: Payer: Medicare HMO

## 2020-07-14 ENCOUNTER — Other Ambulatory Visit: Payer: Self-pay

## 2020-07-14 DIAGNOSIS — M25551 Pain in right hip: Secondary | ICD-10-CM | POA: Diagnosis not present

## 2020-07-14 DIAGNOSIS — M25552 Pain in left hip: Secondary | ICD-10-CM

## 2020-07-14 DIAGNOSIS — M25652 Stiffness of left hip, not elsewhere classified: Secondary | ICD-10-CM | POA: Diagnosis not present

## 2020-07-14 DIAGNOSIS — M25651 Stiffness of right hip, not elsewhere classified: Secondary | ICD-10-CM

## 2020-07-14 DIAGNOSIS — R2689 Other abnormalities of gait and mobility: Secondary | ICD-10-CM

## 2020-07-14 DIAGNOSIS — M6281 Muscle weakness (generalized): Secondary | ICD-10-CM | POA: Diagnosis not present

## 2020-07-14 NOTE — Therapy (Signed)
[facility] [address]  [place], [place], [address] Phone: [phone]   Fax:  [phone]  Physical Therapy Treatment  Patient Details  Name: Tommy Jackson MRN: [id number] Date of Birth: December 22, 1955 Referring Provider (PT): Jean Rosenthal, MD   Encounter Date: 07/14/2020   PT End of Session - 07/14/20 1149    Visit Number 2    Number of Visits 13    Date for PT Re-Evaluation 08/18/20    Authorization Type [employer]    PT Start Time 1102    PT Stop Time 1145    PT Time Calculation (min) 43 min    Activity Tolerance Patient tolerated treatment well    Behavior During Therapy [facility] for tasks assessed/performed           Past Medical History:  Diagnosis Date  . Allergy   . ASTHMA 09/07/2009  . Herniated disc   . HYPERLIPIDEMIA 02/07/2007  . HYPERTENSION 11/29/2005  . OBESITY 10/01/2007  . Sleep apnea    cpap  . SLEEP APNEA, OBSTRUCTIVE 10/01/2007    Past Surgical History:  Procedure Laterality Date  . bicep surgery Right 2014  . COLONOSCOPY    . ELBOW SURGERY Right 2013  . POLYPECTOMY    . removal disc neck  2015  . SHOULDER SURGERY Right 1990    There were no vitals filed for this visit.   Subjective Assessment - 07/14/20 1104    Subjective Pt has been having pain right below the shoulder blades and LBP, has not tried the exercises d/t this.    Pertinent History obesity, HTN, HLD, herniated disc, asthma, R biceps surgery 2014, R elbow surgery 2013, R shoulder surgery 1990    Diagnostic tests 06/02/20 B hip xray: Both hips are well located. Slight narrowing of the right hip joint. Otherwise no acute fractures no bony abnormalities. L Hip is   well-preserved no acute fractures.    Patient Stated Goals decrease pain    Currently in Pain? Yes    Pain Score 8     Pain Location Shoulder    Pain Orientation Right;Left    Pain Descriptors / Indicators Constant    Pain Type Acute pain;Chronic  pain    Pain Score 4    Pain Location Back    Pain Orientation Right;Left;Lower    Pain Descriptors / Indicators Aching;Constant    Pain Type Chronic pain                             [facility] Adult PT Treatment/Exercise - 07/14/20 0001      Exercises   Exercises Lumbar;Knee/Hip      Lumbar Exercises: Stretches   Single Knee to Chest Stretch Right;Left;30 seconds    Piriformis Stretch Right;Left;2 reps;30 seconds    Piriformis Stretch Limitations seated      Lumbar Exercises: Aerobic   Recumbent Bike L2x17min      Lumbar Exercises: Supine   Bridge 10 reps    Straight Leg Raise 10 reps      Knee/Hip Exercises: Standing   Hip Abduction Stengthening;Both;10 reps;Knee straight    Abduction Limitations counter support      Knee/Hip Exercises: Seated   Long Arc Quad Strengthening;Both;10 reps    Long Arc Quad Limitations with ball squeeze    Ball Squeeze 10x5" with core tight and cues for upright posture    Abduction/Adduction  Strengthening;Both;10 reps    Abd/Adduction  Limitations hip ABD                    PT Short Term Goals - 07/14/20 1150      PT SHORT TERM GOAL #1   Title Patient to be independent with initial HEP.    Time 3    Period Weeks    Status On-going    Target Date 07/28/20             PT Long Term Goals - 07/14/20 1150      PT LONG TERM GOAL #1   Title Patient to be independent with advanced HEP.    Time 6    Period Weeks    Status On-going      PT LONG TERM GOAL #2   Title Patient to demonstrate B LE strength >/=4+/5.    Time 6    Period Weeks    Status On-going      PT LONG TERM GOAL #3   Title Patient to demonstrate B hip AROM WFL and without pain limiting.    Time 6    Period Weeks    Status On-going      PT LONG TERM GOAL #4   Title Patient to report no pain in B hips with bed mobility.    Time 6    Period Weeks    Status On-going      PT LONG TERM GOAL #5   Title Patient to report 80% improvement  in hip pain.    Time 6    Period Weeks    Status On-going                 Plan - 07/14/20 1151    Clinical Impression Statement Pt reports that his R or L hips have not been bothering him lately, he has been having the most trouble out of his low back and neck d/t disk herniations. Reviewed HEP with him, providing instruction to keep his feet facing fwd with the standing hip ABD and feet close together with the hip ABD. He showed some instability with the bridges but was able to complete them w/o pain. Pt ambulated into clinic with decreased step length on the R and lat trunk lean to the L. Also did some stretches today for the low back and proximal LEs to reduce soft tissue restriction and increase mobility. Pt responded well.    Personal Factors and Comorbidities Age;Comorbidity 3+;Past/Current Experience;Fitness;Time since onset of injury/illness/exacerbation    Comorbidities obesity, HTN, HLD, herniated disc, asthma, R biceps surgery 2014, R elbow surgery 2013, R shoulder surgery 1990    PT Frequency 2x / week    PT Duration 6 weeks    PT Treatment/Interventions ADLs/Self Care Home Management;Cryotherapy;Electrical Stimulation;Iontophoresis 4mg /ml Dexamethasone;Moist Heat;Balance training;Therapeutic exercise;Therapeutic activities;Functional mobility training;Stair training;Gait training;Ultrasound;Neuromuscular re-education;Patient/family education;Manual techniques;Taping;Energy conservation;Dry needling;Passive range of motion    PT Next Visit Plan hip FOTO; reassess HEP, progress hip strengthening and LE stretching    Consulted and Agree with Plan of Care Patient           Patient will benefit from skilled therapeutic intervention in order to improve the following deficits and impairments:  Abnormal gait,Hypomobility,Decreased activity tolerance,Decreased strength,Increased fascial restricitons,Pain,Decreased balance,Difficulty walking,Increased muscle spasms,Improper body  mechanics,Decreased range of motion,Impaired flexibility,Postural dysfunction  Visit Diagnosis: Pain in right hip  Stiffness of right hip, not elsewhere classified  Pain in left hip  Stiffness of left hip, not elsewhere classified  Muscle weakness (generalized)  Other abnormalities  of gait and mobility     Problem List Patient Active Problem List   Diagnosis Date Noted  . Prediabetes 02/18/2015  . Left leg pain 09/01/2013  . Left knee pain 09/01/2013  . Left hip pain 08/04/2013  . Allergic rhinitis 04/09/2013  . Other abnormal glucose 10/24/2012  . Dermatitis 05/07/2012  . Cervical radiculopathy 02/22/2012  . HERNIATED DISC 05/11/2010  . Asthma 09/07/2009  . Obesity 10/01/2007  . Obstructive sleep apnea 10/01/2007  . Dyslipidemia 02/07/2007  . Essential hypertension 11/29/2005    Artist Pais, PTA 07/14/2020, 12:01 PM  [facility] [address]  [place], [place], [address] Phone: [phone]   Fax:  [phone]  Name: Tommy Jackson MRN: [id number] Date of Birth: 09-22-1955

## 2020-07-20 ENCOUNTER — Encounter: Payer: Self-pay | Admitting: Physical Therapy

## 2020-07-20 ENCOUNTER — Ambulatory Visit: Payer: Medicare HMO | Admitting: Physical Therapy

## 2020-07-20 ENCOUNTER — Other Ambulatory Visit: Payer: Self-pay

## 2020-07-20 DIAGNOSIS — M25552 Pain in left hip: Secondary | ICD-10-CM | POA: Diagnosis not present

## 2020-07-20 DIAGNOSIS — R2689 Other abnormalities of gait and mobility: Secondary | ICD-10-CM

## 2020-07-20 DIAGNOSIS — M6281 Muscle weakness (generalized): Secondary | ICD-10-CM

## 2020-07-20 DIAGNOSIS — M25651 Stiffness of right hip, not elsewhere classified: Secondary | ICD-10-CM

## 2020-07-20 DIAGNOSIS — M25652 Stiffness of left hip, not elsewhere classified: Secondary | ICD-10-CM

## 2020-07-20 DIAGNOSIS — M25551 Pain in right hip: Secondary | ICD-10-CM | POA: Diagnosis not present

## 2020-07-20 NOTE — Therapy (Signed)
[facility] [address]  [place], [place], [address] Phone: [phone]   Fax:  [phone]  Physical Therapy Treatment  Patient Details  Name: Tommy Jackson MRN: [id number] Date of Birth: 1955/11/19 Referring Provider (PT): Jean Rosenthal, MD   Encounter Date: 07/20/2020   PT End of Session - 07/20/20 1102    Visit Number 3    Number of Visits 13    Date for PT Re-Evaluation 08/18/20    Authorization Type [employer]    PT Start Time 8182    PT Stop Time 1102    PT Time Calculation (min) 39 min    Activity Tolerance Patient tolerated treatment well    Behavior During Therapy [facility] for tasks assessed/performed           Past Medical History:  Diagnosis Date  . Allergy   . ASTHMA 09/07/2009  . Herniated disc   . HYPERLIPIDEMIA 02/07/2007  . HYPERTENSION 11/29/2005  . OBESITY 10/01/2007  . Sleep apnea    cpap  . SLEEP APNEA, OBSTRUCTIVE 10/01/2007    Past Surgical History:  Procedure Laterality Date  . bicep surgery Right 2014  . COLONOSCOPY    . ELBOW SURGERY Right 2013  . POLYPECTOMY    . removal disc neck  2015  . SHOULDER SURGERY Right 1990    There were no vitals filed for this visit.   Subjective Assessment - 07/20/20 1024    Subjective Haven't been "perfect" with his HEP d/t his underlying LBP. Denies questions/concerns. Hip pain is improving.    Pertinent History obesity, HTN, HLD, herniated disc, asthma, R biceps surgery 2014, R elbow surgery 2013, R shoulder surgery 1990    Diagnostic tests 06/02/20 B hip xray: Both hips are well located. Slight narrowing of the right hip joint. Otherwise no acute fractures no bony abnormalities. L Hip is   well-preserved no acute fractures.    Patient Stated Goals decrease pain    Pain Score 1     Pain Location Hip    Pain Orientation Right;Left;Lateral    Pain Descriptors / Indicators Aching    Pain Type Acute pain    Pain Score 4    Pain  Location Back    Pain Orientation Right;Left;Lower    Pain Descriptors / Indicators Constant    Pain Type Chronic pain              [facility] PT Assessment - 07/20/20 0001      Observation/Other Assessments   Focus on Therapeutic Outcomes (FOTO)  hip: 45                         [facility] Adult PT Treatment/Exercise - 07/20/20 0001      Lumbar Exercises: Stretches   Other Lumbar Stretch Exercise prayer stretch with green pball 5x3"      Lumbar Exercises: Aerobic   Nustep L4 x 5 min (UEs/LEs)      Lumbar Exercises: Supine   Bridge with clamshell 10 reps   red TB above knees; cues to avoid lumbar extension     Knee/Hip Exercises: Stretches   Piriformis Stretch Right;Left;30 seconds;2 reps    Piriformis Stretch Limitations supine fig 4, KTOS with strap      Knee/Hip Exercises: Standing   Hip Abduction Stengthening;Both;10 reps;Knee straight    Abduction Limitations 2 ski poles   good effort to maintain toes forward; excessive anterior trunk lean   Hip Extension  Stengthening;Right;Left;Both;1 set;10 reps;Knee straight    Extension Limitations 2 ski poles   good effort to maintain trunk tall     Knee/Hip Exercises: Sidelying   Clams 10x red TB   good form                 PT Education - 07/20/20 1102    Education Details update to HEP    Person(s) Educated Patient    Methods Explanation;Demonstration;Tactile cues;Verbal cues;Handout    Comprehension Verbalized understanding;Returned demonstration            PT Short Term Goals - 07/20/20 1103      PT SHORT TERM GOAL #1   Title Patient to be independent with initial HEP.    Time 3    Period Weeks    Status Achieved    Target Date 07/28/20             PT Long Term Goals - 07/14/20 1150      PT LONG TERM GOAL #1   Title Patient to be independent with advanced HEP.    Time 6    Period Weeks    Status On-going      PT LONG TERM GOAL #2   Title Patient to demonstrate B LE strength >/=4+/5.     Time 6    Period Weeks    Status On-going      PT LONG TERM GOAL #3   Title Patient to demonstrate B hip AROM WFL and without pain limiting.    Time 6    Period Weeks    Status On-going      PT LONG TERM GOAL #4   Title Patient to report no pain in B hips with bed mobility.    Time 6    Period Weeks    Status On-going      PT LONG TERM GOAL #5   Title Patient to report 80% improvement in hip pain.    Time 6    Period Weeks    Status On-going                 Plan - 07/20/20 1103    Clinical Impression Statement Patient reporting limited compliance with HEP d/t underlying LBP, but denies questions/concerns on HEP. Reporting that hip pain has improved since initial eval. Worked on hip stretching with strap assist for max comfort, which patient tolerated well. Increased challenge with hip strengthening, which patient tolerated well and able to maintain good form. Patient initially with tendency to demonstrate slight lumbar lordosis with bridges, which improved with cueing. Standing hip strengthening was performed with cueing to maintain hip in neutral and avoid excessive anterior trunk lean, with patient demonstrating good care and attention to maintain proper form. Noted some discomfort in LB after standing ther-ex, thus this was addressed with gentle stretching. Updated HEP with exercises that were well-tolerated today- patient reported understanding. No complaints at end of session. Patient progressing well towards goals.    Personal Factors and Comorbidities Age;Comorbidity 3+;Past/Current Experience;Fitness;Time since onset of injury/illness/exacerbation    Comorbidities obesity, HTN, HLD, herniated disc, asthma, R biceps surgery 2014, R elbow surgery 2013, R shoulder surgery 1990    PT Frequency 2x / week    PT Duration 6 weeks    PT Treatment/Interventions ADLs/Self Care Home Management;Cryotherapy;Electrical Stimulation;Iontophoresis 4mg /ml Dexamethasone;Moist Heat;Balance  training;Therapeutic exercise;Therapeutic activities;Functional mobility training;Stair training;Gait training;Ultrasound;Neuromuscular re-education;Patient/family education;Manual techniques;Taping;Energy conservation;Dry needling;Passive range of motion    PT Next Visit Plan progress hip strengthening and LE  stretching    Consulted and Agree with Plan of Care Patient           Patient will benefit from skilled therapeutic intervention in order to improve the following deficits and impairments:  Abnormal gait,Hypomobility,Decreased activity tolerance,Decreased strength,Increased fascial restricitons,Pain,Decreased balance,Difficulty walking,Increased muscle spasms,Improper body mechanics,Decreased range of motion,Impaired flexibility,Postural dysfunction  Visit Diagnosis: Pain in right hip  Stiffness of right hip, not elsewhere classified  Pain in left hip  Stiffness of left hip, not elsewhere classified  Muscle weakness (generalized)  Other abnormalities of gait and mobility     Problem List Patient Active Problem List   Diagnosis Date Noted  . Prediabetes 02/18/2015  . Left leg pain 09/01/2013  . Left knee pain 09/01/2013  . Left hip pain 08/04/2013  . Allergic rhinitis 04/09/2013  . Other abnormal glucose 10/24/2012  . Dermatitis 05/07/2012  . Cervical radiculopathy 02/22/2012  . HERNIATED DISC 05/11/2010  . Asthma 09/07/2009  . Obesity 10/01/2007  . Obstructive sleep apnea 10/01/2007  . Dyslipidemia 02/07/2007  . Essential hypertension 11/29/2005     Janene Harvey, PT, DPT 07/20/20 11:04 AM   [facility] [address]  [place], [place], [address] Phone: [phone]   Fax:  [phone]  Name: Jaidon Ellery MRN: [id number] Date of Birth: 17-Jul-1955

## 2020-07-23 ENCOUNTER — Ambulatory Visit: Payer: Medicare HMO | Admitting: Physical Therapy

## 2020-07-23 ENCOUNTER — Other Ambulatory Visit: Payer: Self-pay

## 2020-07-23 ENCOUNTER — Encounter: Payer: Self-pay | Admitting: Physical Therapy

## 2020-07-23 DIAGNOSIS — M6281 Muscle weakness (generalized): Secondary | ICD-10-CM | POA: Diagnosis not present

## 2020-07-23 DIAGNOSIS — M25652 Stiffness of left hip, not elsewhere classified: Secondary | ICD-10-CM

## 2020-07-23 DIAGNOSIS — R2689 Other abnormalities of gait and mobility: Secondary | ICD-10-CM

## 2020-07-23 DIAGNOSIS — M25552 Pain in left hip: Secondary | ICD-10-CM | POA: Diagnosis not present

## 2020-07-23 DIAGNOSIS — M25651 Stiffness of right hip, not elsewhere classified: Secondary | ICD-10-CM | POA: Diagnosis not present

## 2020-07-23 DIAGNOSIS — M25551 Pain in right hip: Secondary | ICD-10-CM

## 2020-07-23 NOTE — Therapy (Signed)
[facility] [address]  [place], [place], [address] Phone: [phone]   Fax:  [phone]  Physical Therapy Treatment  Patient Details  Name: Tommy Jackson MRN: [id number] Date of Birth: [birth year] Referring Provider (PT): Jean Rosenthal, MD   Encounter Date: 07/23/2020   PT End of Session - 07/23/20 1216    Visit Number 4    Number of Visits 13    Date for PT Re-Evaluation 08/18/20    Authorization Type [employer]    PT Start Time 1102    PT Stop Time 1142    PT Time Calculation (min) 40 min    Activity Tolerance Patient tolerated treatment well    Behavior During Therapy [facility] for tasks assessed/performed           Past Medical History:  Diagnosis Date  . Allergy   . ASTHMA 09/07/2009  . Herniated disc   . HYPERLIPIDEMIA 02/07/2007  . HYPERTENSION 11/29/2005  . OBESITY 10/01/2007  . Sleep apnea    cpap  . SLEEP APNEA, OBSTRUCTIVE 10/01/2007    Past Surgical History:  Procedure Laterality Date  . bicep surgery Right 2014  . COLONOSCOPY    . ELBOW SURGERY Right 2013  . POLYPECTOMY    . removal disc neck  2015  . SHOULDER SURGERY Right 1990    There were no vitals filed for this visit.   Subjective Assessment - 07/23/20 1104    Subjective Aching a little bit from the weather, but otherwise okay. Reports dramatic improvement in hip pain.    Pertinent History obesity, HTN, HLD, herniated disc, asthma, R biceps surgery 2014, R elbow surgery 2013, R shoulder surgery 1990    Diagnostic tests 06/02/20 B hip xray: Both hips are well located. Slight narrowing of the right hip joint. Otherwise no acute fractures no bony abnormalities. L Hip is   well-preserved no acute fractures.    Patient Stated Goals decrease pain    Currently in Pain? No/denies                             [facility] Adult PT Treatment/Exercise - 07/23/20 0001      Lumbar Exercises: Aerobic   Nustep L4 x 6  min (UEs/LEs)      Lumbar Exercises: Supine   Bridge 10 reps    Bridge Limitations striaght leg bridge with peanut ball   cues to avoid lumbar extension   Bridge with clamshell 10 reps   green TB   Single Leg Bridge 5 reps    Bridge with [employer] Limitations marching bridge   minor hip drop     Knee/Hip Exercises: Standing   Other Standing Knee Exercises sidestepping with red TB around ankles 4x29ft   good tolerance and form after cueing     Knee/Hip Exercises: Sidelying   Hip ABduction Strengthening;Right;Left;1 set;10 reps    Hip ABduction Limitations cueing to maintain proper alignment    Hip ADduction Strengthening;Right;Left;1 set;10 reps    Hip ADduction Limitations opposite LE on bolster   cues to contract core to avoid LBP                 PT Education - 07/23/20 1216    Education Details update to HEP; discussion on L knee alignment and evident lateral patellar tracking    Person(s) Educated Patient    Methods Explanation;Demonstration;Tactile cues;Verbal cues;Handout    Comprehension Verbalized understanding;Returned demonstration  PT Short Term Goals - 07/20/20 1103      PT SHORT TERM GOAL #1   Title Patient to be independent with initial HEP.    Time 3    Period Weeks    Status Achieved    Target Date 07/28/20             PT Long Term Goals - 07/14/20 1150      PT LONG TERM GOAL #1   Title Patient to be independent with advanced HEP.    Time 6    Period Weeks    Status On-going      PT LONG TERM GOAL #2   Title Patient to demonstrate B LE strength >/=4+/5.    Time 6    Period Weeks    Status On-going      PT LONG TERM GOAL #3   Title Patient to demonstrate B hip AROM WFL and without pain limiting.    Time 6    Period Weeks    Status On-going      PT LONG TERM GOAL #4   Title Patient to report no pain in B hips with bed mobility.    Time 6    Period Weeks    Status On-going      PT LONG TERM GOAL #5   Title Patient  to report 80% improvement in hip pain.    Time 6    Period Weeks    Status On-going                 Plan - 07/23/20 1216    Clinical Impression Statement Patient reporting some "achiness" d/t the weather this AM, however no hip pain. Reports the improvement in his hip pain so far has been "dramatic." Worked on progressive glute strengthening mat ther-ex today. Patient demonstrated good care to maintain adequate hip extension with bridges. Cues still required to avoid excessive lumbar extension with more challenging bridge. Hip strengthening was progressed with sidelying with patient demonstrating good ability to maintain proper alignment. Patient did not some "aching" in the LB with hip adduction, thus provided cues to contract abdominals and improve core stability. Patient with c/o slight L knee discomfort with standing ther-ex. Observed excessive L lateral patellar tracking with knee extension, likely d/t TFL tightness. Educated patient on continuing stretching and updated HEP with inner thigh strengthening. Patient reported understanding and without complaints at end of session. Patient progressing well towards goals.    Personal Factors and Comorbidities Age;Comorbidity 3+;Past/Current Experience;Fitness;Time since onset of injury/illness/exacerbation    Comorbidities obesity, HTN, HLD, herniated disc, asthma, R biceps surgery 2014, R elbow surgery 2013, R shoulder surgery 1990    PT Frequency 2x / week    PT Duration 6 weeks    PT Treatment/Interventions ADLs/Self Care Home Management;Cryotherapy;Electrical Stimulation;Iontophoresis 4mg /ml Dexamethasone;Moist Heat;Balance training;Therapeutic exercise;Therapeutic activities;Functional mobility training;Stair training;Gait training;Ultrasound;Neuromuscular re-education;Patient/family education;Manual techniques;Taping;Energy conservation;Dry needling;Passive range of motion    PT Next Visit Plan progress hip strengthening and LE stretching     Consulted and Agree with Plan of Care Patient           Patient will benefit from skilled therapeutic intervention in order to improve the following deficits and impairments:  Abnormal gait,Hypomobility,Decreased activity tolerance,Decreased strength,Increased fascial restricitons,Pain,Decreased balance,Difficulty walking,Increased muscle spasms,Improper body mechanics,Decreased range of motion,Impaired flexibility,Postural dysfunction  Visit Diagnosis: Pain in right hip  Stiffness of right hip, not elsewhere classified  Pain in left hip  Stiffness of left hip, not elsewhere classified  Muscle weakness (  generalized)  Other abnormalities of gait and mobility     Problem List Patient Active Problem List   Diagnosis Date Noted  . Prediabetes 02/18/2015  . Left leg pain 09/01/2013  . Left knee pain 09/01/2013  . Left hip pain 08/04/2013  . Allergic rhinitis 04/09/2013  . Other abnormal glucose 10/24/2012  . Dermatitis 05/07/2012  . Cervical radiculopathy 02/22/2012  . HERNIATED DISC 05/11/2010  . Asthma 09/07/2009  . Obesity 10/01/2007  . Obstructive sleep apnea 10/01/2007  . Dyslipidemia 02/07/2007  . Essential hypertension 11/29/2005     Janene Harvey, PT, DPT 07/23/20 12:19 PM   [facility] [address]  [place], [place], [address] Phone: [phone]   Fax:  [phone]  Name: Meir Elwood MRN: [id number] Date of Birth: 1955-09-05

## 2020-07-27 ENCOUNTER — Encounter: Payer: Self-pay | Admitting: Physical Therapy

## 2020-07-27 ENCOUNTER — Other Ambulatory Visit: Payer: Self-pay

## 2020-07-27 ENCOUNTER — Ambulatory Visit: Payer: Medicare HMO | Attending: Orthopaedic Surgery | Admitting: Physical Therapy

## 2020-07-27 DIAGNOSIS — M25551 Pain in right hip: Secondary | ICD-10-CM | POA: Diagnosis not present

## 2020-07-27 DIAGNOSIS — M25652 Stiffness of left hip, not elsewhere classified: Secondary | ICD-10-CM | POA: Diagnosis not present

## 2020-07-27 DIAGNOSIS — M6281 Muscle weakness (generalized): Secondary | ICD-10-CM | POA: Insufficient documentation

## 2020-07-27 DIAGNOSIS — R2689 Other abnormalities of gait and mobility: Secondary | ICD-10-CM | POA: Diagnosis not present

## 2020-07-27 DIAGNOSIS — M25651 Stiffness of right hip, not elsewhere classified: Secondary | ICD-10-CM | POA: Diagnosis not present

## 2020-07-27 DIAGNOSIS — M25552 Pain in left hip: Secondary | ICD-10-CM | POA: Insufficient documentation

## 2020-07-27 NOTE — Therapy (Signed)
[facility] [address]  [place], [place], [address] Phone: [phone]   Fax:  [phone]  Physical Therapy Treatment  Patient Details  Name: Tommy Jackson MRN: [id number] Date of Birth: [birth year] Referring Provider (PT): Jean Rosenthal, MD   Encounter Date: 07/27/2020   PT End of Session - 07/27/20 1149    Visit Number 5    Number of Visits 13    Date for PT Re-Evaluation 08/18/20    Authorization Type [employer]    PT Start Time 1101    PT Stop Time 1153    PT Time Calculation (min) 52 min    Activity Tolerance Patient tolerated treatment well;Patient limited by pain    Behavior During Therapy [facility] for tasks assessed/performed           Past Medical History:  Diagnosis Date  . Allergy   . ASTHMA 09/07/2009  . Herniated disc   . HYPERLIPIDEMIA 02/07/2007  . HYPERTENSION 11/29/2005  . OBESITY 10/01/2007  . Sleep apnea    cpap  . SLEEP APNEA, OBSTRUCTIVE 10/01/2007    Past Surgical History:  Procedure Laterality Date  . bicep surgery Right 2014  . COLONOSCOPY    . ELBOW SURGERY Right 2013  . POLYPECTOMY    . removal disc neck  2015  . SHOULDER SURGERY Right 1990    There were no vitals filed for this visit.   Subjective Assessment - 07/27/20 1103    Subjective Having a lot of back pain since the weekend without known cause. Having trouble standing up upright and feels like his legs will buckle if he does so. No N/T. Feels better when bending forward. Hips feel good.    Pertinent History obesity, HTN, HLD, herniated disc, asthma, R biceps surgery 2014, R elbow surgery 2013, R shoulder surgery 1990    Diagnostic tests 06/02/20 B hip xray: Both hips are well located. Slight narrowing of the right hip joint. Otherwise no acute fractures no bony abnormalities. L Hip is   well-preserved no acute fractures.    Patient Stated Goals decrease pain    Currently in Pain? Yes    Pain Score 8     Pain Location Back    Pain Orientation Right;Left;Lower    Pain Descriptors / Indicators Aching    Pain Type Chronic pain                             [facility] Adult PT Treatment/Exercise - 07/27/20 0001      Lumbar Exercises: Stretches   Single Knee to Chest Stretch Right;Left;30 seconds;2 reps    Single Knee to Chest Stretch Limitations with strap assist    Lower Trunk Rotation Limitations x20 to tolerance    Other Lumbar Stretch Exercise prayer stretch with green pball 5x3" each forward,R,L   cues to move within tolerance     Modalities   Modalities Electrical Stimulation;Moist Heat      Moist Heat Therapy   Number Minutes Moist Heat 10 Minutes    Moist Heat Location Lumbar Spine      Electrical Stimulation   Electrical Stimulation Location B lumbar paraspinals    Electrical Stimulation Action IFC    Electrical Stimulation Parameters output to tolerance; 10 min    Electrical Stimulation Goals Pain      Manual Therapy   Manual Therapy Soft tissue mobilization;Myofascial release    Manual therapy comments R/L sidelying  Soft tissue mobilization STM/'DTM to R/L proximal glutes, piriformis, QL, lumbar paraspinals    Myofascial Release manual TPR to R/L piriformis, QL, proximal glutes   most tenderness and soft tissue restriction in L piriformis & R QL                   PT Short Term Goals - 07/20/20 1103      PT SHORT TERM GOAL #1   Title Patient to be independent with initial HEP.    Time 3    Period Weeks    Status Achieved    Target Date 07/28/20             PT Long Term Goals - 07/14/20 1150      PT LONG TERM GOAL #1   Title Patient to be independent with advanced HEP.    Time 6    Period Weeks    Status On-going      PT LONG TERM GOAL #2   Title Patient to demonstrate B LE strength >/=4+/5.    Time 6    Period Weeks    Status On-going      PT LONG TERM GOAL #3   Title Patient to demonstrate B hip AROM WFL and without pain  limiting.    Time 6    Period Weeks    Status On-going      PT LONG TERM GOAL #4   Title Patient to report no pain in B hips with bed mobility.    Time 6    Period Weeks    Status On-going      PT LONG TERM GOAL #5   Title Patient to report 80% improvement in hip pain.    Time 6    Period Weeks    Status On-going                 Plan - 07/27/20 1150    Clinical Impression Statement Patient arrived to session with report of flared B LBP without known cause. Noting pain worse with standing upright and feels that his LEs will buckle if he does so. Denies N/T. Notes relief with forward flexion. Today's session focused on gentle lumbopelvic ROM for relief of LBP as patient noted that he was unable to tolerate his HEP for his hips at home d/t this LBP flare. Addressed pain with MT, with patient demonstrating most tenderness and soft tissue restriction in L piriformis & R QL. Ended session with moist heat and e-stim to LB for continued pain relief. Patient reported slightly improved pain levels and appeared to stand more upright at end of session.    Personal Factors and Comorbidities Age;Comorbidity 3+;Past/Current Experience;Fitness;Time since onset of injury/illness/exacerbation    Comorbidities obesity, HTN, HLD, herniated disc, asthma, R biceps surgery 2014, R elbow surgery 2013, R shoulder surgery 1990    PT Frequency 2x / week    PT Duration 6 weeks    PT Treatment/Interventions ADLs/Self Care Home Management;Cryotherapy;Electrical Stimulation;Iontophoresis 4mg /ml Dexamethasone;Moist Heat;Balance training;Therapeutic exercise;Therapeutic activities;Functional mobility training;Stair training;Gait training;Ultrasound;Neuromuscular re-education;Patient/family education;Manual techniques;Taping;Energy conservation;Dry needling;Passive range of motion    PT Next Visit Plan progress hip strengthening and LE stretching    Consulted and Agree with Plan of Care Patient            Patient will benefit from skilled therapeutic intervention in order to improve the following deficits and impairments:  Abnormal gait,Hypomobility,Decreased activity tolerance,Decreased strength,Increased fascial restricitons,Pain,Decreased balance,Difficulty walking,Increased muscle spasms,Improper body mechanics,Decreased range of motion,Impaired flexibility,Postural dysfunction  Visit Diagnosis: Pain in right hip  Stiffness of right hip, not elsewhere classified  Pain in left hip  Stiffness of left hip, not elsewhere classified  Muscle weakness (generalized)  Other abnormalities of gait and mobility     Problem List Patient Active Problem List   Diagnosis Date Noted  . Prediabetes 02/18/2015  . Left leg pain 09/01/2013  . Left knee pain 09/01/2013  . Left hip pain 08/04/2013  . Allergic rhinitis 04/09/2013  . Other abnormal glucose 10/24/2012  . Dermatitis 05/07/2012  . Cervical radiculopathy 02/22/2012  . HERNIATED DISC 05/11/2010  . Asthma 09/07/2009  . Obesity 10/01/2007  . Obstructive sleep apnea 10/01/2007  . Dyslipidemia 02/07/2007  . Essential hypertension 11/29/2005     Janene Harvey, PT, DPT 07/27/20 12:05 PM   [facility] [address]  [place], [place], [address] Phone: [phone]   Fax:  [phone]  Name: Tommy Jackson MRN: [id number] Date of Birth: 1955-03-03

## 2020-07-29 ENCOUNTER — Other Ambulatory Visit: Payer: Self-pay

## 2020-07-29 ENCOUNTER — Ambulatory Visit: Payer: Medicare HMO | Attending: Orthopaedic Surgery | Admitting: Rehabilitative and Restorative Service Providers"

## 2020-07-29 ENCOUNTER — Encounter: Payer: Self-pay | Admitting: Rehabilitative and Restorative Service Providers"

## 2020-07-29 DIAGNOSIS — M25651 Stiffness of right hip, not elsewhere classified: Secondary | ICD-10-CM | POA: Diagnosis not present

## 2020-07-29 DIAGNOSIS — M25652 Stiffness of left hip, not elsewhere classified: Secondary | ICD-10-CM | POA: Diagnosis not present

## 2020-07-29 DIAGNOSIS — R2689 Other abnormalities of gait and mobility: Secondary | ICD-10-CM | POA: Diagnosis not present

## 2020-07-29 DIAGNOSIS — M25552 Pain in left hip: Secondary | ICD-10-CM | POA: Diagnosis not present

## 2020-07-29 DIAGNOSIS — M25551 Pain in right hip: Secondary | ICD-10-CM | POA: Insufficient documentation

## 2020-07-29 DIAGNOSIS — M6281 Muscle weakness (generalized): Secondary | ICD-10-CM | POA: Insufficient documentation

## 2020-07-29 NOTE — Therapy (Addendum)
[facility] [address]  [place], [place], [address] Phone: [phone]   Fax:  [phone]  Physical Therapy Treatment  Patient Details  Name: Derrion Tritz MRN: [id number] Date of Birth: [birth year] Referring Provider (PT): Jean Rosenthal, MD  Progress Note Reporting Period 07/07/20 to 07/29/20  See note below for Objective Data and Assessment of Progress/Goals.     Encounter Date: 07/29/2020   PT End of Session - 07/29/20 1053     Visit Number 6    Number of Visits 13    Date for PT Re-Evaluation 08/18/20    Authorization Type [employer]    PT Start Time 1771    PT Stop Time 1130    PT Time Calculation (min) 45 min    Activity Tolerance Patient tolerated treatment well;Patient limited by pain    Behavior During Therapy [facility] for tasks assessed/performed             Past Medical History:  Diagnosis Date   Allergy    ASTHMA 09/07/2009   Herniated disc    HYPERLIPIDEMIA 02/07/2007   HYPERTENSION 11/29/2005   OBESITY 10/01/2007   Sleep apnea    cpap   SLEEP APNEA, OBSTRUCTIVE 10/01/2007    Past Surgical History:  Procedure Laterality Date   bicep surgery Right 2014   COLONOSCOPY     ELBOW SURGERY Right 2013   POLYPECTOMY     removal disc neck  2015   SHOULDER SURGERY Right 1990    There were no vitals filed for this visit.   Subjective Assessment - 07/29/20 1050     Subjective My back was feeling better on Tuesday after the therapy, but my back is hurting again today.  It is not hurting constantly, just in certain positions.    Pertinent History obesity, HTN, HLD, herniated disc, asthma, R biceps surgery 2014, R elbow surgery 2013, R shoulder surgery 1990    Diagnostic tests 06/02/20 B hip xray: Both hips are well located. Slight narrowing of the right hip joint. Otherwise no acute fractures no bony abnormalities. L Hip is   well-preserved no acute fractures.    Patient Stated Goals  decrease pain    Currently in Pain? Yes    Pain Score 1     Pain Location Hip    Pain Orientation Right;Left    Pain Descriptors / Indicators Aching    Pain Score 8    Pain Location Back    Pain Orientation Lower    Pain Descriptors / Indicators Aching    Pain Type Chronic pain    Pain Frequency Intermittent                               [facility] Adult PT Treatment/Exercise - 07/29/20 0001       Lumbar Exercises: Stretches   Passive Hamstring Stretch Right;Left;2 reps;30 seconds    Passive Hamstring Stretch Limitations Seated hinged position    Other Lumbar Stretch Exercise prayer stretch with green pball 5x3" each forward,R,L      Lumbar Exercises: Aerobic   Nustep L3 x 6 min (UEs/LEs)      Knee/Hip Exercises: Standing   Hip Flexion Stengthening;Both;1 set;10 reps    Hip Flexion Limitations holding cabinet    Hip Abduction Stengthening;Both;10 reps;Knee straight    Abduction Limitations holding cabinet    Hip Extension Stengthening;Right;Left;Both;1 set;10 reps;Knee straight    Extension Limitations holding cabinet  Knee/Hip Exercises: Seated   Long Arc Quad Strengthening;Both;10 reps    Long Arc Quad Limitations with ball squeeze    Clamshell with TheraBand Red   2x10   Hamstring Curl Strengthening;Both;2 sets;10 reps    Hamstring Limitations red tband      Manual Therapy   Manual Therapy Soft tissue mobilization;Myofascial release    Soft tissue mobilization STM to thoracic and lumbar area of back, primarily to the R side    Myofascial Release manual TPR to areas of tight musculature on R thoracic and lumbar area/paraspinals.                      PT Short Term Goals - 07/20/20 1103       PT SHORT TERM GOAL #1   Title Patient to be independent with initial HEP.    Time 3    Period Weeks    Status Achieved    Target Date 07/28/20               PT Long Term Goals - 07/29/20 1135       PT LONG TERM GOAL #1   Title  Patient to be independent with advanced HEP.    Status On-going      PT LONG TERM GOAL #2   Title Patient to demonstrate B LE strength >/=4+/5.    Status On-going      PT LONG TERM GOAL #3   Title Patient to demonstrate B hip AROM WFL and without pain limiting.    Status On-going      PT LONG TERM GOAL #4   Title Patient to report no pain in B hips with bed mobility.    Status On-going      PT LONG TERM GOAL #5   Title Patient to report 80% improvement in hip pain.    Status Partially Met                   Plan - 07/29/20 1133     Clinical Impression Statement Pt arrived reporting 8/10 pain with stiff and antalgic gait pattern.  He continues to report pain worse with certain standing positions.  He was able to progress through session.  Following STM and trigger point release to R side of thoracic and lumbar region of back, pt was able to report a significant decrease in pain to 3/10.  At completion of session, pt reported that he was not having any pain in his back and felt much better.  He continues to require skilled PT to progress towards goal related activities.    Personal Factors and Comorbidities Age;Comorbidity 3+;Past/Current Experience;Fitness;Time since onset of injury/illness/exacerbation    Comorbidities obesity, HTN, HLD, herniated disc, asthma, R biceps surgery 2014, R elbow surgery 2013, R shoulder surgery 1990    PT Treatment/Interventions ADLs/Self Care Home Management;Cryotherapy;Electrical Stimulation;Iontophoresis 36m/ml Dexamethasone;Moist Heat;Balance training;Therapeutic exercise;Therapeutic activities;Functional mobility training;Stair training;Gait training;Ultrasound;Neuromuscular re-education;Patient/family education;Manual techniques;Taping;Energy conservation;Dry needling;Passive range of motion    PT Next Visit Plan progress hip strengthening and LE stretching    Consulted and Agree with Plan of Care Patient             Patient will benefit  from skilled therapeutic intervention in order to improve the following deficits and impairments:  Abnormal gait,Hypomobility,Decreased activity tolerance,Decreased strength,Increased fascial restricitons,Pain,Decreased balance,Difficulty walking,Increased muscle spasms,Improper body mechanics,Decreased range of motion,Impaired flexibility,Postural dysfunction  Visit Diagnosis: Pain in right hip  Stiffness of right hip, not elsewhere classified  Pain in  left hip  Stiffness of left hip, not elsewhere classified  Muscle weakness (generalized)  Other abnormalities of gait and mobility     Problem List Patient Active Problem List   Diagnosis Date Noted   Prediabetes 02/18/2015   Left leg pain 09/01/2013   Left knee pain 09/01/2013   Left hip pain 08/04/2013   Allergic rhinitis 04/09/2013   Other abnormal glucose 10/24/2012   Dermatitis 05/07/2012   Cervical radiculopathy 02/22/2012   HERNIATED DISC 05/11/2010   Asthma 09/07/2009   Obesity 10/01/2007   Obstructive sleep apnea 10/01/2007   Dyslipidemia 02/07/2007   Essential hypertension 11/29/2005    Juel Burrow, PT, DPT 07/29/2020, 11:39 AM  [facility] [address]  [place], [place], [address] Phone: [phone]   Fax:  [phone]  Name: Meshach Perry MRN: [id number] Date of Birth: 08-11-1955   PHYSICAL THERAPY DISCHARGE SUMMARY  Visits from Start of Care: 5  Current functional level related to goals / functional outcomes: Unable to assess; patient did not return d/t exacerbation of LBP and per MD's request   Remaining deficits: Unable to assess   Education / Equipment: HEP  Plan: Patient agrees to discharge.  Patient goals were not met. Patient is being discharged due to MD's request.    Janene Harvey, PT, DPT 09/15/20 3:49 PM

## 2020-08-03 ENCOUNTER — Encounter: Payer: Medicare HMO | Admitting: Physical Therapy

## 2020-08-03 ENCOUNTER — Other Ambulatory Visit: Payer: Self-pay

## 2020-08-04 ENCOUNTER — Ambulatory Visit (INDEPENDENT_AMBULATORY_CARE_PROVIDER_SITE_OTHER): Payer: Medicare HMO | Admitting: Adult Health

## 2020-08-04 ENCOUNTER — Encounter: Payer: Self-pay | Admitting: Adult Health

## 2020-08-04 VITALS — BP 150/100 | HR 78 | Temp 98.2°F | Ht 72.6 in | Wt 325.0 lb

## 2020-08-04 DIAGNOSIS — R079 Chest pain, unspecified: Secondary | ICD-10-CM | POA: Diagnosis not present

## 2020-08-04 DIAGNOSIS — M4807 Spinal stenosis, lumbosacral region: Secondary | ICD-10-CM | POA: Diagnosis not present

## 2020-08-04 MED ORDER — METHYLPREDNISOLONE 4 MG PO TBPK: ORAL_TABLET | ORAL | 0 refills | Status: DC

## 2020-08-04 MED ORDER — OXYCODONE-ACETAMINOPHEN 10-325 MG PO TABS: 1.0000 | ORAL_TABLET | Freq: Four times a day (QID) | ORAL | 0 refills | Status: DC

## 2020-08-04 NOTE — Progress Notes (Signed)
Subjective:    Patient ID: Tommy Jackson, male    DOB: Sep 14, 1955, 65 y.o.   MRN: [id number]  HPI 65 year old male who  has a past medical history of Allergy, ASTHMA (09/07/2009), Herniated disc, HYPERLIPIDEMIA (02/07/2007), HYPERTENSION (11/29/2005), OBESITY (10/01/2007), Sleep apnea, and SLEEP APNEA, OBSTRUCTIVE (10/01/2007).  History of chronic back pain since 1996 when he injured it falling on ice at work.  He is seen by [facility] neurology due to this pain for Workmen's Comp. issues and is prescribed lidocaine patches as well as meloxicam.  He has responded to prednisone packs when he has flareups in the past.  Muscle relaxers make him very sleepy.  Today he reports that pain has been worse over the last 2 weeks feels as though it is a little bit lower than his previous injury.  He is unable to stand straight up and when he walks at times he will have radiating pain on the outside of both legs.  He will also have low back spasms which makes his knees buckle.  He has not had any falls or aggravating trauma.  No previous MRI of the lower lumbar spine is noted in his chart  Additionally, he reports that over the last couple weeks he has had a couple instances where he will have some discomfort in his left upper back that feels as though it radiates to his front chest.  This is felt more as a burning sensation and only lasts a few seconds at a time.  Due to family history of cardiac disease he would like to get an EKG.  BP Readings from Last 3 Encounters:  08/04/20 (!) 150/100  03/25/19 130/80  08/29/18 (!) 148/75    Review of Systems See HPI   Past Medical History:  Diagnosis Date  . Allergy   . ASTHMA 09/07/2009  . Herniated disc   . HYPERLIPIDEMIA 02/07/2007  . HYPERTENSION 11/29/2005  . OBESITY 10/01/2007  . Sleep apnea    cpap  . SLEEP APNEA, OBSTRUCTIVE 10/01/2007    Social History   Socioeconomic History  . Marital status: Married    Spouse name: Not on file  . Number of  children: 3  . Years of education: Not on file  . Highest education level: Not on file  Occupational History  . Occupation: retired Facilities manager term disability    Employer: RETIRED  Tobacco Use  . Smoking status: Never Smoker  . Smokeless tobacco: Never Used  Substance and Sexual Activity  . Alcohol use: Yes    Alcohol/week: 0.0 standard drinks    Comment: rare alcohol intake  . Drug use: No  . Sexual activity: Not on file  Other Topics Concern  . Not on file  Social History Narrative  . Not on file   Social Determinants of Health   Financial Resource Strain: Low Risk   . Difficulty of Paying Living Expenses: Not hard at all  Food Insecurity: No Food Insecurity  . Worried About Charity fundraiser in the Last Year: Never true  . Ran Out of Food in the Last Year: Never true  Transportation Needs: No Transportation Needs  . Lack of Transportation (Medical): No  . Lack of Transportation (Non-Medical): No  Physical Activity: Inactive  . Days of Exercise per Week: 0 days  . Minutes of Exercise per Session: 0 min  Stress: No Stress Concern Present  . Feeling of Stress : Not at all  Social Connections: Moderately Integrated  . Frequency  of Communication with Friends and Family: More than three times a week  . Frequency of Social Gatherings with Friends and Family: More than three times a week  . Attends Religious Services: 1 to 4 times per year  . Active Member of Clubs or Organizations: No  . Attends Archivist Meetings: Never  . Marital Status: Married  Human resources officer Violence: Not At Risk  . Fear of Current or Ex-Partner: No  . Emotionally Abused: No  . Physically Abused: No  . Sexually Abused: No    Past Surgical History:  Procedure Laterality Date  . bicep surgery Right 2014  . COLONOSCOPY    . ELBOW SURGERY Right 2013  . POLYPECTOMY    . removal disc neck  2015  . SHOULDER SURGERY Right 1990    Family History  Problem Relation Age of Onset   . Colon cancer Father 70  . Lung cancer Father   . CAD Father   . Diabetes Father   . Heart attack Father   . Colon cancer Brother 29  . Heart attack Paternal Grandfather   . CAD Brother   . Esophageal cancer Neg Hx   . Stomach cancer Neg Hx   . Rectal cancer Neg Hx     Allergies  Allergen Reactions  . Aspirin     REACTION: unspecified  . Glycerin Nausea Only  . Penicillins     REACTION: family hx  . Metformin And Related Diarrhea    Current Outpatient Medications on File Prior to Visit  Medication Sig Dispense Refill  . albuterol (VENTOLIN HFA) 108 (90 Base) MCG/ACT inhaler Inhale 2 puffs into the lungs every 4 (four) hours as needed. 6.7 g 3  . amLODipine (NORVASC) 10 MG tablet TAKE 1 TABLET EVERY DAY 90 tablet 3  . Ascorbic Acid (VITAMIN C) 1000 MG tablet Take 1,000 mg by mouth daily.    Marland Kitchen azelastine (ASTELIN) 0.1 % nasal spray Place 2 sprays into both nostrils 2 (two) times daily. Use in each nostril as directed 90 mL 3  . Cetirizine HCl (ZYRTEC PO) Take by mouth daily.    . fenofibrate (TRICOR) 145 MG tablet TAKE 1 TABLET (145 MG TOTAL) BY MOUTH DAILY. 90 tablet 3  . fluticasone (FLONASE) 50 MCG/ACT nasal spray Place 1 spray into both nostrils 2 (two) times daily. 48 g 3  . furosemide (LASIX) 20 MG tablet TAKE 1 TABLET EVERY DAY 90 tablet 3  . lidocaine (LIDODERM) 5 % Apply 1-3 patches to painful area for up to 12 hours per day    . meloxicam (MOBIC) 15 MG tablet Take 15 mg by mouth daily.    . metoprolol tartrate (LOPRESSOR) 100 MG tablet TAKE 1 TABLET TWICE DAILY 180 tablet 3  . Multiple Vitamin (MULTIVITAMIN) capsule Take 1 capsule by mouth daily.    . NON FORMULARY     . potassium chloride (KLOR-CON) 10 MEQ tablet TAKE 1 TABLET EVERY DAY 90 tablet 3  . pravastatin (PRAVACHOL) 40 MG tablet TAKE 1 TABLET (40 MG TOTAL) BY MOUTH DAILY. 90 tablet 3  . valsartan-hydrochlorothiazide (DIOVAN-HCT) 320-25 MG tablet TAKE 1 TABLET EVERY DAY 90 tablet 3   No current  facility-administered medications on file prior to visit.    BP (!) 150/100   Pulse 78   Temp 98.2 F (36.8 C) (Oral)   Ht 6' 0.6" (1.844 m)   Wt (!) 325 lb (147.4 kg)   SpO2 93%   BMI 43.35 kg/m  Objective:   Physical Exam Vitals and nursing note reviewed.  Constitutional:      Appearance: Normal appearance. He is obese.  Cardiovascular:     Rate and Rhythm: Normal rate and regular rhythm.     Pulses: Normal pulses.     Heart sounds: Normal heart sounds.  Pulmonary:     Effort: Pulmonary effort is normal.     Breath sounds: Normal breath sounds.  Musculoskeletal:        General: Tenderness present.     Lumbar back: Spasms, tenderness and bony tenderness present. No swelling, deformity or signs of trauma. Decreased range of motion.     Comments: Walks slightly hunched at the waist.  Unable to stand straight up without significant discomfort.  Skin:    General: Skin is warm and dry.  Neurological:     General: No focal deficit present.     Mental Status: He is alert and oriented to person, place, and time.  Psychiatric:        Mood and Affect: Mood normal.        Behavior: Behavior normal.        Thought Content: Thought content normal.        Judgment: Judgment normal.       Assessment & Plan:  1. Spinal stenosis of lumbosacral region -Prescribed Medrol Dosepak and Percocet for pain relief.  Get MRI of the lumbar spine.  Consider referral to orthopedics or neurosurgery for further evaluation - oxyCODONE-acetaminophen (PERCOCET) 10-325 MG tablet; Take 1 tablet by mouth every 6 (six) hours for 7 days.  Dispense: 28 tablet; Refill: 0 - methylPREDNISolone (MEDROL DOSEPAK) 4 MG TBPK tablet; Take as directed  Dispense: 21 tablet; Refill: 0 - MR Lumbar Spine Wo Contrast; Future  2. Chest pain, unspecified type  - EKG 12-Lead- NSR, Rate 61 - Likely muscular   Dorothyann Peng, NP

## 2020-08-06 ENCOUNTER — Other Ambulatory Visit: Payer: Self-pay

## 2020-08-06 ENCOUNTER — Telehealth: Payer: Self-pay | Admitting: Adult Health

## 2020-08-06 DIAGNOSIS — M4807 Spinal stenosis, lumbosacral region: Secondary | ICD-10-CM

## 2020-08-06 MED ORDER — OXYCODONE-ACETAMINOPHEN 10-325 MG PO TABS: 1.0000 | ORAL_TABLET | Freq: Three times a day (TID) | ORAL | 0 refills | Status: AC | PRN

## 2020-08-06 NOTE — Telephone Encounter (Signed)
New Rx sent in by Dr.Burchette. Pt notified of update! No further action needed!

## 2020-08-06 NOTE — Telephone Encounter (Signed)
Patient is calling and stated that he saw provider yesterday and he was prescribed oxyCODONE-acetaminophen (PERCOCET) 10-325 MG tablet but the pharmacy stated the prescription needed to be corrected. Pt is asking for a call back, please advise. CB is [phone]

## 2020-08-06 NOTE — Telephone Encounter (Signed)
Please see note.

## 2020-08-06 NOTE — Telephone Encounter (Signed)
Spoke to pharmacist and they stated Rx could not be filled due to protocol. Pharmacist stated that the Rx can be filled only if the sig stated take every 8hrs instead of 6 . Also, it will have to be switched from 7 days to 5 days.   Please advise if ok to change. If so, please accept order below.

## 2020-08-06 NOTE — Progress Notes (Unsigned)
Spoke to pt and he stated that he is having trouble filling his medication. Spoke to the pharmacist and they stated for acute pain the Rx can only be filled for 5 days max and sig. Need to be every 8hrs not every 6hrs. New Rx need to be sent in.   Ok to fill? Please advise if so please accept orders below.

## 2020-08-13 ENCOUNTER — Encounter: Payer: Medicare HMO | Admitting: Physical Therapy

## 2020-08-19 ENCOUNTER — Telehealth: Payer: Self-pay | Admitting: Adult Health

## 2020-08-19 NOTE — Telephone Encounter (Signed)
Patient states his MRI on his back is scheduled for 08/24/20.  Oxygen line and a Diazepam needs to be added to his referral orders, it was left off.

## 2020-08-23 NOTE — Telephone Encounter (Signed)
Called pt and advised that The Rx could not be refilled since it has not been prescribed by [facility]. I also articulated that I wasn't sure if another provider would sign off bc of the same reason. Pt verbalized understanding and stated that he will try without the O2 and diazepam.

## 2020-08-23 NOTE — Telephone Encounter (Signed)
Pt called back and stated that the order needed to come from the provider. Pt stated that the Diazepam needs to written by a doctor. [phone] number was given to me via Pt. Will call and see what needs to be done as far as the O2.   Spoke to imaging center and they stated that the only way Pt can get O2 from them is only if he O2 dependant. Pt is not O2 dependant. Will call pt and advise.

## 2020-08-23 NOTE — Telephone Encounter (Signed)
Pt stated that he had an MRI in the past and became anxious. Pt stated that they had to stop the MRI due to pt being anxious. Pt was given O2 and diazepam to continue. Pt wanting Diazepam and O2 order. Pt also stated that he had pins in his neck that may cause some issues with the MRI. Pt was advised to call the imaging center and talk to them about the O2, Diazepam and pins in neck. Pt verbalized understanding and will call us back with update.

## 2020-08-24 ENCOUNTER — Ambulatory Visit
Admission: RE | Admit: 2020-08-24 | Discharge: 2020-08-24 | Disposition: A | Discharge status: Home / Self Care | Payer: Medicare HMO | Source: Ambulatory Visit | Attending: Adult Health | Admitting: Adult Health

## 2020-08-24 DIAGNOSIS — M48061 Spinal stenosis, lumbar region without neurogenic claudication: Secondary | ICD-10-CM | POA: Diagnosis not present

## 2020-08-24 DIAGNOSIS — M4807 Spinal stenosis, lumbosacral region: Secondary | ICD-10-CM

## 2020-08-26 ENCOUNTER — Other Ambulatory Visit: Payer: Self-pay

## 2020-08-26 DIAGNOSIS — M48061 Spinal stenosis, lumbar region without neurogenic claudication: Secondary | ICD-10-CM

## 2020-09-15 ENCOUNTER — Other Ambulatory Visit: Payer: Self-pay

## 2020-09-16 ENCOUNTER — Ambulatory Visit (INDEPENDENT_AMBULATORY_CARE_PROVIDER_SITE_OTHER): Payer: Medicare HMO | Admitting: Adult Health

## 2020-09-16 ENCOUNTER — Encounter: Payer: Self-pay | Admitting: Adult Health

## 2020-09-16 VITALS — BP 130/82 | HR 71 | Temp 98.7°F | Ht 72.72 in | Wt 323.0 lb

## 2020-09-16 DIAGNOSIS — E669 Obesity, unspecified: Secondary | ICD-10-CM | POA: Diagnosis not present

## 2020-09-16 DIAGNOSIS — E785 Hyperlipidemia, unspecified: Secondary | ICD-10-CM

## 2020-09-16 DIAGNOSIS — J4520 Mild intermittent asthma, uncomplicated: Secondary | ICD-10-CM

## 2020-09-16 DIAGNOSIS — M4807 Spinal stenosis, lumbosacral region: Secondary | ICD-10-CM

## 2020-09-16 DIAGNOSIS — Z0000 Encounter for general adult medical examination without abnormal findings: Secondary | ICD-10-CM | POA: Diagnosis not present

## 2020-09-16 DIAGNOSIS — Z125 Encounter for screening for malignant neoplasm of prostate: Secondary | ICD-10-CM

## 2020-09-16 DIAGNOSIS — I10 Essential (primary) hypertension: Secondary | ICD-10-CM | POA: Diagnosis not present

## 2020-09-16 LAB — CBC WITH DIFFERENTIAL/PLATELET
Basophils Absolute: 0 10*3/uL (ref 0.0–0.1)
Basophils Relative: 0.6 % (ref 0.0–3.0)
Eosinophils Absolute: 0.3 10*3/uL (ref 0.0–0.7)
Eosinophils Relative: 5.2 % — ABNORMAL HIGH (ref 0.0–5.0)
HCT: 43.2 % (ref 39.0–52.0)
Hemoglobin: 14.7 g/dL (ref 13.0–17.0)
Lymphocytes Relative: 29.1 % (ref 12.0–46.0)
Lymphs Abs: 1.9 10*3/uL (ref 0.7–4.0)
MCHC: 34 g/dL (ref 30.0–36.0)
MCV: 91.3 fl (ref 78.0–100.0)
Monocytes Absolute: 0.5 10*3/uL (ref 0.1–1.0)
Monocytes Relative: 8 % (ref 3.0–12.0)
Neutro Abs: 3.7 10*3/uL (ref 1.4–7.7)
Neutrophils Relative %: 57.1 % (ref 43.0–77.0)
Platelets: 189 10*3/uL (ref 150.0–400.0)
RBC: 4.73 Mil/uL (ref 4.22–5.81)
RDW: 13.8 % (ref 11.5–15.5)
WBC: 6.5 10*3/uL (ref 4.0–10.5)

## 2020-09-16 LAB — LIPID PANEL
Cholesterol: 119 mg/dL (ref 0–200)
HDL: 40.7 mg/dL (ref >39.00)
LDL Cholesterol: 43 mg/dL (ref 0–99)
NonHDL: 78.38
Total CHOL/HDL Ratio: 3
Triglycerides: 175 mg/dL — ABNORMAL HIGH (ref 0.0–149.0)
VLDL: 35 mg/dL (ref 0.0–40.0)

## 2020-09-16 LAB — COMPREHENSIVE METABOLIC PANEL
ALT: 37 U/L (ref 0–53)
AST: 25 U/L (ref 0–37)
Albumin: 4.8 g/dL (ref 3.5–5.2)
Alkaline Phosphatase: 40 U/L (ref 39–117)
BUN: 19 mg/dL (ref 6–23)
CO2: 29 mEq/L (ref 19–32)
Calcium: 9.9 mg/dL (ref 8.4–10.5)
Chloride: 102 mEq/L (ref 96–112)
Creatinine, Ser: 0.97 mg/dL (ref 0.40–1.50)
GFR: 82.69 mL/min (ref >60.00)
Glucose, Bld: 128 mg/dL — ABNORMAL HIGH (ref 70–99)
Potassium: 3.8 mEq/L (ref 3.5–5.1)
Sodium: 141 mEq/L (ref 135–145)
Total Bilirubin: 0.5 mg/dL (ref 0.2–1.2)
Total Protein: 7.3 g/dL (ref 6.0–8.3)

## 2020-09-16 LAB — PSA: PSA: 0.78 ng/mL (ref 0.10–4.00)

## 2020-09-16 LAB — TSH: TSH: 1.3 u[IU]/mL (ref 0.35–5.50)

## 2020-09-16 LAB — HEMOGLOBIN A1C: Hgb A1c MFr Bld: 6.9 % — ABNORMAL HIGH (ref 4.6–6.5)

## 2020-09-16 NOTE — Progress Notes (Signed)
Subjective:    Patient ID: Tommy Jackson, male    DOB: February 06, 1956, 65 y.o.   MRN: [id number]  HPI Patient presents for yearly preventative medicine examination. He is a pleasant 65 year old male who  has a past medical history of Allergy, ASTHMA (09/07/2009), Herniated disc, HYPERLIPIDEMIA (02/07/2007), HYPERTENSION (11/29/2005), OBESITY (10/01/2007), Sleep apnea, and SLEEP APNEA, OBSTRUCTIVE (10/01/2007).  Essential Hypertension - prescribed Norvasc 10 mg, Diovan HCTZ 320-25 mg, Lopressor 100 mg, and Lasix 20 mg.  He denies dizziness, lightheadedness, chest pain, shortness of breath, syncopal episodes.  He does not monitor his blood pressure at home  BP Readings from Last 3 Encounters:  09/16/20 130/82  08/04/20 (!) 150/100  03/25/19 130/80    Hyperlipidemia -prescribed pravastatin 40 mg and fenofibrate 145 mg daily.  He denies myalgia or fatigue Lab Results  Component Value Date   CHOL 146 04/08/2019   HDL 37.80 (L) 04/08/2019   LDLCALC 55 02/11/2015   LDLDIRECT 69.0 04/08/2019   TRIG 278.0 (H) 04/08/2019   CHOLHDL 4 04/08/2019   Asthma -mild and intermittent.  He uses rescue inhaler as needed  Morbid Obesity-would like to start with weight loss but finds it difficult to exercise due to chronic back pain.  Does not eat a lot during the day but does snack in the evening and have large meals for dinner Wt Readings from Last 3 Encounters:  09/16/20 (!) 323 lb (146.5 kg)  08/04/20 (!) 325 lb (147.4 kg)  06/02/20 (!) 334 lb 9.6 oz (151.8 kg)   Spinal Stenosis -has an appointment with neurosurgery for significant spinal stenosis in his lumbar spine.  Appointment is in mid August.  Does report pretty significant pain especially with change in positions and ambulation.  Does have oxycodone at home that he takes infrequently but reports that this does not help with his discomfort.  All immunizations and health maintenance protocols were reviewed with the patient and needed orders were  placed.  Appropriate screening laboratory values were ordered for the patient including screening of hyperlipidemia, renal function and hepatic function. If indicated by BPH, a PSA was ordered.  Medication reconciliation,  past medical history, social history, problem list and allergies were reviewed in detail with the patient  Goals were established with regard to weight loss, exercise, and  diet in compliance with medications  Review of Systems  Constitutional: Negative.   HENT: Negative.    Eyes: Negative.   Respiratory: Negative.    Cardiovascular: Negative.   Gastrointestinal: Negative.   Endocrine: Negative.   Genitourinary: Negative.   Musculoskeletal:  Positive for arthralgias, back pain and gait problem.  Skin: Negative.   Allergic/Immunologic: Negative.   Hematological: Negative.   Psychiatric/Behavioral: Negative.    All other systems reviewed and are negative.  Past Medical History:  Diagnosis Date   Allergy    ASTHMA 09/07/2009   Herniated disc    HYPERLIPIDEMIA 02/07/2007   HYPERTENSION 11/29/2005   OBESITY 10/01/2007   Sleep apnea    cpap   SLEEP APNEA, OBSTRUCTIVE 10/01/2007    Social History   Socioeconomic History   Marital status: Married    Spouse name: Not on file   Number of children: 3   Years of education: Not on file   Highest education level: Not on file  Occupational History   Occupation: retired truck driver,long term disability    Employer: RETIRED  Tobacco Use   Smoking status: Never   Smokeless tobacco: Never  Substance and Sexual Activity  Alcohol use: Yes    Alcohol/week: 0.0 standard drinks    Comment: rare alcohol intake   Drug use: No   Sexual activity: Not on file  Other Topics Concern   Not on file  Social History Narrative   Not on file   Social Determinants of Health   Financial Resource Strain: Low Risk    Difficulty of Paying Living Expenses: Not hard at all  Food Insecurity: No Food Insecurity   Worried About  Charity fundraiser in the Last Year: Never true   [place] in the Last Year: Never true  Transportation Needs: No Transportation Needs   Lack of Transportation (Medical): No   Lack of Transportation (Non-Medical): No  Physical Activity: Inactive   Days of Exercise per Week: 0 days   Minutes of Exercise per Session: 0 min  Stress: No Stress Concern Present   Feeling of Stress : Not at all  Social Connections: Moderately Integrated   Frequency of Communication with Friends and Family: More than three times a week   Frequency of Social Gatherings with Friends and Family: More than three times a week   Attends Religious Services: 1 to 4 times per year   Active Member of [employer] or Organizations: No   Attends Music therapist: Never   Marital Status: Married  Human resources officer Violence: Not At Risk   Fear of Current or Ex-Partner: No   Emotionally Abused: No   Physically Abused: No   Sexually Abused: No    Past Surgical History:  Procedure Laterality Date   bicep surgery Right 2014   COLONOSCOPY     ELBOW SURGERY Right 2013   POLYPECTOMY     removal disc neck  2015   SHOULDER SURGERY Right 1990    Family History  Problem Relation Age of Onset   Colon cancer Father 23   Lung cancer Father    CAD Father    Diabetes Father    Heart attack Father    Colon cancer Brother 74   Heart attack Paternal Grandfather    CAD Brother    Esophageal cancer Neg Hx    Stomach cancer Neg Hx    Rectal cancer Neg Hx     Allergies  Allergen Reactions   Aspirin     REACTION: unspecified   Glycerin Nausea Only   Penicillins     REACTION: family hx   Metformin And Related Diarrhea    Current Outpatient Medications on File Prior to Visit  Medication Sig Dispense Refill   amLODipine (NORVASC) 10 MG tablet TAKE 1 TABLET EVERY DAY 90 tablet 3   fenofibrate (TRICOR) 145 MG tablet TAKE 1 TABLET (145 MG TOTAL) BY MOUTH DAILY. 90 tablet 3   furosemide (LASIX) 20 MG tablet  TAKE 1 TABLET EVERY DAY 90 tablet 3   meloxicam (MOBIC) 15 MG tablet Take 15 mg by mouth daily.     Multiple Vitamin (MULTIVITAMIN) capsule Take 1 capsule by mouth daily.     potassium chloride (KLOR-CON) 10 MEQ tablet TAKE 1 TABLET EVERY DAY 90 tablet 3   pravastatin (PRAVACHOL) 40 MG tablet TAKE 1 TABLET (40 MG TOTAL) BY MOUTH DAILY. 90 tablet 3   valsartan-hydrochlorothiazide (DIOVAN-HCT) 320-25 MG tablet TAKE 1 TABLET EVERY DAY 90 tablet 3   albuterol (VENTOLIN HFA) 108 (90 Base) MCG/ACT inhaler Inhale 2 puffs into the lungs every 4 (four) hours as needed. (Patient not taking: Reported on 09/16/2020) 6.7 g 3   Ascorbic  Acid (VITAMIN C) 1000 MG tablet Take 1,000 mg by mouth daily. (Patient not taking: Reported on 09/16/2020)     azelastine (ASTELIN) 0.1 % nasal spray Place 2 sprays into both nostrils 2 (two) times daily. Use in each nostril as directed (Patient not taking: Reported on 09/16/2020) 90 mL 3   Cetirizine HCl (ZYRTEC PO) Take by mouth daily. (Patient not taking: Reported on 09/16/2020)     fluticasone (FLONASE) 50 MCG/ACT nasal spray Place 1 spray into both nostrils 2 (two) times daily. (Patient not taking: Reported on 09/16/2020) 48 g 3   lidocaine (LIDODERM) 5 % Apply 1-3 patches to painful area for up to 12 hours per day (Patient not taking: Reported on 09/16/2020)     metoprolol tartrate (LOPRESSOR) 100 MG tablet TAKE 1 TABLET TWICE DAILY 180 tablet 3   NON FORMULARY  (Patient not taking: Reported on 09/16/2020)     No current facility-administered medications on file prior to visit.    BP 130/82   Pulse 71   Temp 98.7 F (37.1 C) (Oral)   Ht 6' 0.72" (1.847 m)   Wt (!) 323 lb (146.5 kg)   SpO2 94%   BMI 42.94 kg/m       Objective:   Physical Exam Vitals and nursing note reviewed.  Constitutional:      General: He is not in acute distress.    Appearance: Normal appearance. He is well-developed. He is obese.  HENT:     Head: Normocephalic and atraumatic.     Right  Ear: Tympanic membrane, ear canal and external ear normal. There is no impacted cerumen.     Left Ear: Tympanic membrane, ear canal and external ear normal. There is no impacted cerumen.     Nose: Nose normal. No congestion or rhinorrhea.     Mouth/Throat:     Mouth: Mucous membranes are moist.     Pharynx: Oropharynx is clear. No oropharyngeal exudate or posterior oropharyngeal erythema.  Eyes:     General:        Right eye: No discharge.        Left eye: No discharge.     Extraocular Movements: Extraocular movements intact.     Conjunctiva/sclera: Conjunctivae normal.     Pupils: Pupils are equal, round, and reactive to light.  Neck:     Vascular: No carotid bruit.     Trachea: No tracheal deviation.  Cardiovascular:     Rate and Rhythm: Normal rate and regular rhythm.     Pulses: Normal pulses.     Heart sounds: Normal heart sounds. No murmur heard.   No friction rub. No gallop.  Pulmonary:     Effort: Pulmonary effort is normal. No respiratory distress.     Breath sounds: Normal breath sounds. No stridor. No wheezing, rhonchi or rales.  Chest:     Chest wall: No tenderness.  Abdominal:     General: Bowel sounds are normal. There is no distension.     Palpations: Abdomen is soft. There is no mass.     Tenderness: There is no abdominal tenderness. There is no right CVA tenderness, left CVA tenderness, guarding or rebound.     Hernia: No hernia is present.  Musculoskeletal:        General: No swelling, tenderness, deformity or signs of injury. Normal range of motion.     Right lower leg: No edema.     Left lower leg: No edema.  Lymphadenopathy:     Cervical: No cervical  adenopathy.  Skin:    General: Skin is warm and dry.     Capillary Refill: Capillary refill takes less than 2 seconds.     Coloration: Skin is not jaundiced or pale.     Findings: No bruising, erythema, lesion or rash.  Neurological:     General: No focal deficit present.     Mental Status: He is alert and  oriented to person, place, and time.     Cranial Nerves: No cranial nerve deficit.     Sensory: No sensory deficit.     Motor: No weakness.     Coordination: Coordination normal.     Gait: Gait abnormal.     Deep Tendon Reflexes: Reflexes normal.  Psychiatric:        Mood and Affect: Mood normal.        Behavior: Behavior normal.        Thought Content: Thought content normal.        Judgment: Judgment normal.      Assessment & Plan:  1. Routine general medical examination at a health care facility  - CBC with Differential/Platelet; Future - Comprehensive metabolic panel; Future - Hemoglobin A1c; Future - Lipid panel; Future - TSH; Future - TSH - Lipid panel - Hemoglobin A1c - Comprehensive metabolic panel - CBC with Differential/Platelet  2. Mild intermittent asthma without complication - Rescue inhaler PRN  - CBC with Differential/Platelet; Future - Comprehensive metabolic panel; Future - Hemoglobin A1c; Future - Lipid panel; Future - TSH; Future - TSH - Lipid panel - Hemoglobin A1c - Comprehensive metabolic panel - CBC with Differential/Platelet  3. Essential hypertension - Controlled. No change  - CBC with Differential/Platelet; Future - Comprehensive metabolic panel; Future - Hemoglobin A1c; Future - Lipid panel; Future - TSH; Future - TSH - Lipid panel - Hemoglobin A1c - Comprehensive metabolic panel - CBC with Differential/Platelet  4. Dyslipidemia - Consider increase in statin  - CBC with Differential/Platelet; Future - Comprehensive metabolic panel; Future - Hemoglobin A1c; Future - Lipid panel; Future - TSH; Future - TSH - Lipid panel - Hemoglobin A1c - Comprehensive metabolic panel - CBC with Differential/Platelet  5. Obesity with serious comorbidity, unspecified classification, unspecified obesity type -We will consider Wegovy once it is back in stock to help him lose weight.  Cut back on late-night snacking and portion size for  dinner - CBC with Differential/Platelet; Future - Comprehensive metabolic panel; Future - Hemoglobin A1c; Future - Lipid panel; Future - TSH; Future - TSH - Lipid panel - Hemoglobin A1c - Comprehensive metabolic panel - CBC with Differential/Platelet  6. Prostate cancer screening  - PSA; Future - PSA  87 Spinal stenosis of lumbosacral region -Follow-up with neurosurgery as directed  Dorothyann Peng, NP

## 2020-09-16 NOTE — Patient Instructions (Addendum)
It was great seeing you today   I will follow up with you regarding your blood work   Please let me know if you need anything

## 2020-09-17 ENCOUNTER — Other Ambulatory Visit: Payer: Self-pay | Admitting: Adult Health

## 2020-09-21 ENCOUNTER — Telehealth: Payer: Self-pay | Admitting: Adult Health

## 2020-09-21 MED ORDER — TRULICITY 0.75 MG/0.5ML ~~LOC~~ SOAJ: 0.7500 mg | SUBCUTANEOUS | 0 refills | Status: AC

## 2020-09-21 NOTE — Telephone Encounter (Signed)
Updated patient on his labs.  His A1c is 6.9.  We have used metformin in the past for prediabetes but this gave him diarrhea.  We will trial Trulicity and see if it is covered by his insurance

## 2020-09-29 ENCOUNTER — Encounter: Payer: Self-pay | Admitting: Adult Health

## 2020-10-05 ENCOUNTER — Encounter: Payer: Self-pay | Admitting: Adult Health

## 2020-10-05 NOTE — Telephone Encounter (Signed)
Routing to PCP

## 2020-10-11 DIAGNOSIS — M48062 Spinal stenosis, lumbar region with neurogenic claudication: Secondary | ICD-10-CM | POA: Diagnosis not present

## 2020-10-11 DIAGNOSIS — M5136 Other intervertebral disc degeneration, lumbar region: Secondary | ICD-10-CM | POA: Diagnosis not present

## 2020-10-11 DIAGNOSIS — M47816 Spondylosis without myelopathy or radiculopathy, lumbar region: Secondary | ICD-10-CM | POA: Diagnosis not present

## 2020-11-15 DIAGNOSIS — Z6841 Body Mass Index (BMI) 40.0 and over, adult: Secondary | ICD-10-CM | POA: Diagnosis not present

## 2020-11-15 DIAGNOSIS — M48062 Spinal stenosis, lumbar region with neurogenic claudication: Secondary | ICD-10-CM | POA: Diagnosis not present

## 2020-11-15 DIAGNOSIS — M47816 Spondylosis without myelopathy or radiculopathy, lumbar region: Secondary | ICD-10-CM | POA: Diagnosis not present

## 2020-11-15 DIAGNOSIS — M5136 Other intervertebral disc degeneration, lumbar region: Secondary | ICD-10-CM | POA: Diagnosis not present

## 2020-12-06 DIAGNOSIS — M48061 Spinal stenosis, lumbar region without neurogenic claudication: Secondary | ICD-10-CM | POA: Diagnosis not present

## 2020-12-06 DIAGNOSIS — M4726 Other spondylosis with radiculopathy, lumbar region: Secondary | ICD-10-CM | POA: Diagnosis not present

## 2020-12-06 DIAGNOSIS — M5136 Other intervertebral disc degeneration, lumbar region: Secondary | ICD-10-CM | POA: Diagnosis not present

## 2020-12-29 ENCOUNTER — Other Ambulatory Visit: Payer: Self-pay | Admitting: Adult Health

## 2020-12-29 DIAGNOSIS — R600 Localized edema: Secondary | ICD-10-CM

## 2020-12-29 DIAGNOSIS — E785 Hyperlipidemia, unspecified: Secondary | ICD-10-CM

## 2020-12-29 DIAGNOSIS — I10 Essential (primary) hypertension: Secondary | ICD-10-CM

## 2021-01-18 ENCOUNTER — Ambulatory Visit (INDEPENDENT_AMBULATORY_CARE_PROVIDER_SITE_OTHER): Payer: Medicare HMO | Admitting: Adult Health

## 2021-01-18 ENCOUNTER — Encounter: Payer: Self-pay | Admitting: Adult Health

## 2021-01-18 VITALS — BP 130/80 | HR 73 | Temp 97.3°F | Ht 72.9 in | Wt 313.0 lb

## 2021-01-18 DIAGNOSIS — M5136 Other intervertebral disc degeneration, lumbar region: Secondary | ICD-10-CM | POA: Diagnosis not present

## 2021-01-18 DIAGNOSIS — E1169 Type 2 diabetes mellitus with other specified complication: Secondary | ICD-10-CM | POA: Diagnosis not present

## 2021-01-18 DIAGNOSIS — Z23 Encounter for immunization: Secondary | ICD-10-CM | POA: Diagnosis not present

## 2021-01-18 DIAGNOSIS — I10 Essential (primary) hypertension: Secondary | ICD-10-CM

## 2021-01-18 DIAGNOSIS — M48061 Spinal stenosis, lumbar region without neurogenic claudication: Secondary | ICD-10-CM | POA: Diagnosis not present

## 2021-01-18 DIAGNOSIS — M48062 Spinal stenosis, lumbar region with neurogenic claudication: Secondary | ICD-10-CM | POA: Diagnosis not present

## 2021-01-18 LAB — POCT GLYCOSYLATED HEMOGLOBIN (HGB A1C): HbA1c, POC (controlled diabetic range): 6 % (ref 0.0–7.0)

## 2021-01-18 NOTE — Progress Notes (Signed)
Subjective:    Patient ID: Tommy Jackson, male    DOB: [birth year], 65 y.o.   MRN: [id number]  HPI 65 year old male who  has a past medical history of Allergy, ASTHMA (09/07/2009), Herniated disc, HYPERLIPIDEMIA (02/07/2007), HYPERTENSION (11/29/2005), OBESITY (10/01/2007), Sleep apnea, and SLEEP APNEA, OBSTRUCTIVE (10/01/2007).  He presents to the office today for follow-up regarding diabetes and hypertension.  In the past he had been prediabetic, unfortunately during his physical exam in July his A1c had increased to 6.9.  In the past we had tried him on metformin but this caused diarrhea.  When he became diabetic we started him on Trulicity 8.67 mg weekly.  Since starting this medication he reports that he has not had any side effects. He has been trying to cut back on portions.   Wt Readings from Last 3 Encounters:  01/18/21 (!) 313 lb (142 kg)  09/16/20 (!) 323 lb (146.5 kg)  08/04/20 (!) 325 lb (147.4 kg)   Blood pressure is controlled with Norvasc 10 mg, Diovan HCTZ 320-25 mg, Lopressor 100 mg, and Lasix 20 mg daily.  He denies dizziness, lightheadedness, chest pain, shortness of breath, or syncopal episodes. BP Readings from Last 3 Encounters:  01/18/21 130/80  09/16/20 130/82  08/04/20 (!) 150/100    Review of Systems See HPI   Past Medical History:  Diagnosis Date   Allergy    ASTHMA 09/07/2009   Herniated disc    HYPERLIPIDEMIA 02/07/2007   HYPERTENSION 11/29/2005   OBESITY 10/01/2007   Sleep apnea    cpap   SLEEP APNEA, OBSTRUCTIVE 10/01/2007    Social History   Socioeconomic History   Marital status: Married    Spouse name: Not on file   Number of children: 3   Years of education: Not on file   Highest education level: Not on file  Occupational History   Occupation: retired truck driver,long term disability    Employer: RETIRED  Tobacco Use   Smoking status: Never   Smokeless tobacco: Never  Substance and Sexual Activity   Alcohol use: Yes    Alcohol/week:  0.0 standard drinks    Comment: rare alcohol intake   Drug use: No   Sexual activity: Not on file  Other Topics Concern   Not on file  Social History Narrative   Not on file   Social Determinants of Health   Financial Resource Strain: Low Risk    Difficulty of Paying Living Expenses: Not hard at all  Food Insecurity: No Food Insecurity   Worried About Charity fundraiser in the Last Year: Never true   [place] in the Last Year: Never true  Transportation Needs: No Transportation Needs   Lack of Transportation (Medical): No   Lack of Transportation (Non-Medical): No  Physical Activity: Inactive   Days of Exercise per Week: 0 days   Minutes of Exercise per Session: 0 min  Stress: No Stress Concern Present   Feeling of Stress : Not at all  Social Connections: Moderately Integrated   Frequency of Communication with Friends and Family: More than three times a week   Frequency of Social Gatherings with Friends and Family: More than three times a week   Attends Religious Services: 1 to 4 times per year   Active Member of [employer] or Organizations: No   Attends Archivist Meetings: Never   Marital Status: Married  Human resources officer Violence: Not At Risk   Fear of Current or Ex-Partner: No  Emotionally Abused: No   Physically Abused: No   Sexually Abused: No    Past Surgical History:  Procedure Laterality Date   bicep surgery Right 2014   COLONOSCOPY     ELBOW SURGERY Right 2013   POLYPECTOMY     removal disc neck  2015   SHOULDER SURGERY Right 1990    Family History  Problem Relation Age of Onset   Colon cancer Father 65   Lung cancer Father    CAD Father    Diabetes Father    Heart attack Father    Colon cancer Brother 44   Heart attack Paternal Grandfather    CAD Brother    Esophageal cancer Neg Hx    Stomach cancer Neg Hx    Rectal cancer Neg Hx     Allergies  Allergen Reactions   Aspirin     REACTION: unspecified   Glycerin Nausea Only    Penicillins     REACTION: family hx   Metformin And Related Diarrhea    Current Outpatient Medications on File Prior to Visit  Medication Sig Dispense Refill   albuterol (VENTOLIN HFA) 108 (90 Base) MCG/ACT inhaler Inhale 2 puffs into the lungs every 4 (four) hours as needed. 6.7 g 3   amLODipine (NORVASC) 10 MG tablet TAKE 1 TABLET EVERY DAY 90 tablet 3   fenofibrate (TRICOR) 145 MG tablet TAKE 1 TABLET (145 MG TOTAL) BY MOUTH DAILY. 90 tablet 3   furosemide (LASIX) 20 MG tablet TAKE 1 TABLET EVERY DAY 90 tablet 3   meloxicam (MOBIC) 15 MG tablet Take 15 mg by mouth daily.     metoprolol tartrate (LOPRESSOR) 100 MG tablet TAKE 1 TABLET TWICE DAILY 180 tablet 3   Multiple Vitamin (MULTIVITAMIN) capsule Take 1 capsule by mouth daily.     potassium chloride (KLOR-CON) 10 MEQ tablet TAKE 1 TABLET EVERY DAY 90 tablet 3   pravastatin (PRAVACHOL) 40 MG tablet TAKE 1 TABLET (40 MG TOTAL) BY MOUTH DAILY. 90 tablet 3   valsartan-hydrochlorothiazide (DIOVAN-HCT) 320-25 MG tablet TAKE 1 TABLET EVERY DAY 90 tablet 3   No current facility-administered medications on file prior to visit.    BP 130/80   Pulse 73   Temp (!) 97.3 F (36.3 C) (Oral)   Ht 6' 0.9" (1.852 m)   Wt (!) 313 lb (142 kg)   SpO2 97%   BMI 41.41 kg/m       Objective:   Physical Exam Vitals and nursing note reviewed.  Constitutional:      Appearance: Normal appearance.  Cardiovascular:     Rate and Rhythm: Normal rate and regular rhythm.     Pulses: Normal pulses.     Heart sounds: Normal heart sounds.  Pulmonary:     Effort: Pulmonary effort is normal.     Breath sounds: Normal breath sounds.  Musculoskeletal:        General: Normal range of motion.  Skin:    General: Skin is warm and dry.     Capillary Refill: Capillary refill takes less than 2 seconds.  Neurological:     General: No focal deficit present.     Mental Status: He is alert and oriented to person, place, and time.  Psychiatric:        Mood and  Affect: Mood normal.        Behavior: Behavior normal.        Thought Content: Thought content normal.        Judgment: Judgment normal.  Assessment & Plan:  1. Type 2 diabetes mellitus with other specified complication, unspecified whether long term insulin use (HCC)  - POC HgB A1c- 6.0  - Weight down 10 pounds.  - Will increase dose to 1.5 mg. Paperwork brought for patient assistance   2. Essential hypertension - well controlled.  - No changes in medication   3. Need for immunization against influenza - Flu Vaccine QUAD 2mo+IM (Fluarix, Fluzone & Alfiuria Quad PF)  Dorothyann Peng, NP

## 2021-01-18 NOTE — Patient Instructions (Signed)
It was great seeing you today!  I have increased your Trulicity to 1.5 mg   Your A1c has decreased to 6.0 from 6.9 and your weight is down 10 pounds! Keep it up  Lets see each other back in three months

## 2021-01-19 DIAGNOSIS — Z23 Encounter for immunization: Secondary | ICD-10-CM | POA: Diagnosis not present

## 2021-01-19 DIAGNOSIS — E1169 Type 2 diabetes mellitus with other specified complication: Secondary | ICD-10-CM | POA: Diagnosis not present

## 2021-01-19 DIAGNOSIS — I10 Essential (primary) hypertension: Secondary | ICD-10-CM | POA: Diagnosis not present

## 2021-02-10 ENCOUNTER — Telehealth: Payer: Self-pay | Admitting: Adult Health

## 2021-02-10 NOTE — Telephone Encounter (Signed)
Error/njr °

## 2021-02-11 ENCOUNTER — Telehealth: Payer: Medicare HMO | Admitting: Adult Health

## 2021-03-03 ENCOUNTER — Other Ambulatory Visit: Payer: Self-pay

## 2021-03-03 ENCOUNTER — Ambulatory Visit: Payer: Medicare HMO | Admitting: Pulmonary Disease

## 2021-03-03 ENCOUNTER — Encounter: Payer: Self-pay | Admitting: Pulmonary Disease

## 2021-03-03 VITALS — BP 136/82 | HR 74 | Temp 98.0°F | Ht 72.0 in | Wt 316.4 lb

## 2021-03-03 DIAGNOSIS — G4733 Obstructive sleep apnea (adult) (pediatric): Secondary | ICD-10-CM | POA: Diagnosis not present

## 2021-03-03 DIAGNOSIS — J45909 Unspecified asthma, uncomplicated: Secondary | ICD-10-CM | POA: Diagnosis not present

## 2021-03-03 NOTE — Progress Notes (Signed)
Subjective:    Patient ID: Tommy Jackson, male    DOB: 1955/07/12, 66 y.o.   MRN: [id number]  HPI  Chief Complaint  Patient presents with   Consult    Sleep apnea consult. Pt currently does use a CPAP. He used to see an old provider here and is just here to establish new care    66 year old man presents to establish care for OSA and asthma. He is originally from [place], [place] OSA was diagnosed in 2009, initial titration showed residual events on CPAP of 14 cm and subsequent titration required BiPAP 16/10 cm.  On his last follow-up visit in 2017, he was noted to be on CPAP of 14 cm with good compliance and download was reviewed which showed residual AHI of 5/hour. He has been very compliant with his machine, settled down with a full facemask and reports good improvement in his daytime somnolence and fatigue.  He is still using his first machine. Weight is more or less unchanged since his sleep study. Epworth sleepiness score is 5. He has always been a night owl, bedtime is around midnight but could be as late as 3 AM, he sleeps on his back but rolls over anxiety, reports few nocturnal awakenings but sleeps fitfully until he gets out of bed between 7 and 9 AM.  Has had of bed is elevated a few inches and he sleeps on 1 pillow.  Wife has not noted breakthrough snoring on CPAP, he denies daytime naps There is no history suggestive of cataplexy, sleep paralysis or parasomnias   He was diagnosed with asthma in 2012.  He now only uses albuterol on an as-needed basis.  He denies any major exacerbations in the last 3 years and has not needed steroids   Significant tests/ events reviewed  NPSG 01/2008: AHI 86 CPAP 14 cm with residula AHI 8/h BiPAP titration 01/2008 16/10   Past Medical History:  Diagnosis Date   Allergy    ASTHMA 09/07/2009   Herniated disc    HYPERLIPIDEMIA 02/07/2007   HYPERTENSION 11/29/2005   OBESITY 10/01/2007   Sleep apnea    cpap   SLEEP APNEA,  OBSTRUCTIVE 10/01/2007    Past Surgical History:  Procedure Laterality Date   bicep surgery Right 2014   COLONOSCOPY     ELBOW SURGERY Right 2013   POLYPECTOMY     removal disc neck  2015   SHOULDER SURGERY Right 1990    Allergies  Allergen Reactions   Aspirin     REACTION: unspecified   Glycerin Nausea Only   Penicillins     REACTION: family hx   Metformin And Related Diarrhea    Social History   Socioeconomic History   Marital status: Married    Spouse name: Not on file   Number of children: 3   Years of education: Not on file   Highest education level: Not on file  Occupational History   Occupation: retired truck driver,long term disability    Employer: RETIRED  Tobacco Use   Smoking status: Never   Smokeless tobacco: Never  Substance and Sexual Activity   Alcohol use: Yes    Alcohol/week: 0.0 standard drinks    Comment: rare alcohol intake   Drug use: No   Sexual activity: Not on file  Other Topics Concern   Not on file  Social History Narrative   Not on file   Social Determinants of Health   Financial Resource Strain: Low Risk    Difficulty of  Paying Living Expenses: Not hard at all  Food Insecurity: No Food Insecurity   Worried About [place] in the Last Year: Never true   Ran Out of Food in the Last Year: Never true  Transportation Needs: No Transportation Needs   Lack of Transportation (Medical): No   Lack of Transportation (Non-Medical): No  Physical Activity: Inactive   Days of Exercise per Week: 0 days   Minutes of Exercise per Session: 0 min  Stress: No Stress Concern Present   Feeling of Stress : Not at all  Social Connections: Moderately Integrated   Frequency of Communication with Friends and Family: More than three times a week   Frequency of Social Gatherings with Friends and Family: More than three times a week   Attends Religious Services: 1 to 4 times per year   Active Member of [employer] or Organizations: No   Attends Arts development officer: Never   Marital Status: Married  Human resources officer Violence: Not At Risk   Fear of Current or Ex-Partner: No   Emotionally Abused: No   Physically Abused: No   Sexually Abused: No      Family History  Problem Relation Age of Onset   Colon cancer Father 16   Lung cancer Father    CAD Father    Diabetes Father    Heart attack Father    Colon cancer Brother 39   Heart attack Paternal Grandfather    CAD Brother    Esophageal cancer Neg Hx    Stomach cancer Neg Hx    Rectal cancer Neg Hx      Review of Systems   Constitutional: negative for anorexia, fevers and sweats  Eyes: negative for irritation, redness and visual disturbance  Ears, nose, mouth, throat, and face: negative for earaches, epistaxis and sore throat  Respiratory: negative for cough, dyspnea on exertion, sputum and wheezing  Cardiovascular: negative for chest pain, dyspnea, lower extremity edema, orthopnea, palpitations and syncope  Gastrointestinal: negative for abdominal pain, constipation, diarrhea, melena, nausea and vomiting  Genitourinary:negative for dysuria, frequency and hematuria  Hematologic/lymphatic: negative for bleeding, easy bruising and lymphadenopathy  Musculoskeletal:negative for arthralgias, muscle weakness  Neurological: negative for coordination problems, gait problems, headaches and weakness  Endocrine: negative for diabetic symptoms including polydipsia, polyuria and weight loss     Objective:   Physical Exam  Gen. Pleasant, obese, in no distress, normal affect ENT - no pallor,icterus, no post nasal drip, class 2-3 airway Neck: No JVD, no thyromegaly, no carotid bruits Lungs: no use of accessory muscles, no dullness to percussion, decreased without rales or rhonchi  Cardiovascular: Rhythm regular, heart sounds  normal, no murmurs or gallops, no peripheral edema Abdomen: soft and non-tender, no hepatosplenomegaly, BS normal. Musculoskeletal: No deformities,  no cyanosis or clubbing Neuro:  alert, non focal, no tremors       Assessment & Plan:

## 2021-03-03 NOTE — Patient Instructions (Signed)
X Rx for autoCPAP 10-16 cm will be sent to DME

## 2021-03-03 NOTE — Assessment & Plan Note (Signed)
His asthma seems to be mild intermittent and I am not even sure this is true asthma, however it could possibly be related to obesity and deconditioning. Will continue to use albuterol on an as-needed basis and will consider spirometry in the future if this becomes an active issue

## 2021-03-03 NOTE — Assessment & Plan Note (Signed)
He is very compliant by report.  CPAP is certainly helped improve his daytime somnolence and fatigue.  His current CPAP is seems to be set at 14 cm.  He would be eligible for replacement machine.  We will send in prescription for auto CPAP and set at between 10 and 16 cm.  We will obtain a download after he uses this for a month and tweak settings as needed In the meantime, he will be provided with CPAP supplies  Weight loss encouraged, compliance with goal of at least 4-6 hrs every night is the expectation. Advised against medications with sedative side effects Cautioned against driving when sleepy - understanding that sleepiness will vary on a day to day basis

## 2021-03-21 ENCOUNTER — Ambulatory Visit (INDEPENDENT_AMBULATORY_CARE_PROVIDER_SITE_OTHER): Payer: Medicare HMO

## 2021-03-21 VITALS — Ht 72.0 in | Wt 308.0 lb

## 2021-03-21 DIAGNOSIS — Z0000 Encounter for general adult medical examination without abnormal findings: Secondary | ICD-10-CM | POA: Diagnosis not present

## 2021-03-21 NOTE — Progress Notes (Signed)
I connected with [name] today by telephone and verified that I am speaking with the correct person using two identifiers. Location patient: home Location provider: work Persons participating in the virtual visit: Jong, Rickman LPN.   I discussed the limitations, risks, security and privacy concerns of performing an evaluation and management service by telephone and the availability of in person appointments. I also discussed with the patient that there may be a patient responsible charge related to this service. The patient expressed understanding and verbally consented to this telephonic visit.    Interactive audio and video telecommunications were attempted between this provider and patient, however failed, due to patient having technical difficulties OR patient did not have access to video capability.  We continued and completed visit with audio only.     Vital signs may be patient reported or missing.  Subjective:   Tommy Jackson is a 66 y.o. male who presents for Medicare Annual/Subsequent preventive examination.  Review of Systems     Cardiac Risk Factors include: advanced age (>22men, >70 women);dyslipidemia;hypertension;male gender;obesity (BMI >30kg/m2)     Objective:    Today's Vitals   03/21/21 1026 03/21/21 1027  Weight: (!) 308 lb (139.7 kg)   Height: 6' (1.829 m)   PainSc:  3    Body mass index is 41.77 kg/m.  Advanced Directives 03/21/2021 07/07/2020 03/23/2020 05/02/2016 04/29/2015  Does Patient Have a Medical Advance Directive? Yes Yes No Yes Yes  Type of Paramedic of [place];Living will - - Living will Living will;Healthcare Power of Attorney  Does patient want to make changes to medical advance directive? - No - Patient declined No - Patient declined - -  Copy of [place] in Chart? No - copy requested - - - -    Current Medications (verified) Outpatient Encounter Medications as of 03/21/2021   Medication Sig   albuterol (VENTOLIN HFA) 108 (90 Base) MCG/ACT inhaler Inhale 2 puffs into the lungs every 4 (four) hours as needed.   amLODipine (NORVASC) 10 MG tablet TAKE 1 TABLET EVERY DAY   Ascorbic Acid (VITAMIN C) 100 MG tablet Take 100 mg by mouth daily.   azelastine (ASTELIN) 0.1 % nasal spray one spray by Both Nostrils route 2 (two) times daily. Use in each nostril as directed   Dulaglutide (TRULICITY) 1.5 LD/3.5TS SOPN Inject 1.5 mg into the skin once a week.   fenofibrate (TRICOR) 145 MG tablet TAKE 1 TABLET (145 MG TOTAL) BY MOUTH DAILY.   furosemide (LASIX) 20 MG tablet TAKE 1 TABLET EVERY DAY   gabapentin (NEURONTIN) 400 MG capsule Take by mouth.   meloxicam (MOBIC) 15 MG tablet Take 15 mg by mouth daily.   metoprolol tartrate (LOPRESSOR) 100 MG tablet TAKE 1 TABLET TWICE DAILY   Multiple Vitamin (MULTIVITAMIN) capsule Take 1 capsule by mouth daily.   potassium chloride (KLOR-CON) 10 MEQ tablet TAKE 1 TABLET EVERY DAY   pravastatin (PRAVACHOL) 40 MG tablet TAKE 1 TABLET (40 MG TOTAL) BY MOUTH DAILY.   valsartan-hydrochlorothiazide (DIOVAN-HCT) 320-25 MG tablet TAKE 1 TABLET EVERY DAY   cetirizine (ZYRTEC) 10 MG tablet Take by mouth. (Patient not taking: Reported on 03/21/2021)   No facility-administered encounter medications on file as of 03/21/2021.    Allergies (verified) Aspirin, Glycerin, Penicillins, and Metformin and related   History: Past Medical History:  Diagnosis Date   Allergy    ASTHMA 09/07/2009   Herniated disc    HYPERLIPIDEMIA 02/07/2007   HYPERTENSION 11/29/2005  OBESITY 10/01/2007   Sleep apnea    cpap   SLEEP APNEA, OBSTRUCTIVE 10/01/2007   Past Surgical History:  Procedure Laterality Date   bicep surgery Right 2014   COLONOSCOPY     ELBOW SURGERY Right 2013   POLYPECTOMY     removal disc neck  2015   SHOULDER SURGERY Right 1990   Family History  Problem Relation Age of Onset   Colon cancer Father 84   Lung cancer Father    CAD Father     Diabetes Father    Heart attack Father    Colon cancer Brother 99   Heart attack Paternal Grandfather    CAD Brother    Esophageal cancer Neg Hx    Stomach cancer Neg Hx    Rectal cancer Neg Hx    Social History   Socioeconomic History   Marital status: Married    Spouse name: Not on file   Number of children: 3   Years of education: Not on file   Highest education level: Not on file  Occupational History   Occupation: retired truck Chief Technology Officer term disability    Employer: RETIRED  Tobacco Use   Smoking status: Never   Smokeless tobacco: Never  Vaping Use   Vaping Use: Never used  Substance and Sexual Activity   Alcohol use: Yes    Alcohol/week: 0.0 standard drinks    Comment: rare alcohol intake   Drug use: No   Sexual activity: Not on file  Other Topics Concern   Not on file  Social History Narrative   Not on file   Social Determinants of Health   Financial Resource Strain: Unknown   Difficulty of Paying Living Expenses: Patient refused  Food Insecurity: No Food Insecurity   Worried About Running Out of Food in the Last Year: Never true   Ran Out of Food in the Last Year: Never true  Transportation Needs: No Transportation Needs   Lack of Transportation (Medical): No   Lack of Transportation (Non-Medical): No  Physical Activity: Inactive   Days of Exercise per Week: 0 days   Minutes of Exercise per Session: 0 min  Stress: No Stress Concern Present   Feeling of Stress : Not at all  Social Connections: Moderately Integrated   Frequency of Communication with Friends and Family: More than three times a week   Frequency of Social Gatherings with Friends and Family: More than three times a week   Attends Religious Services: 1 to 4 times per year   Active Member of [employer] or Organizations: No   Attends Music therapist: Never   Marital Status: Married    Tobacco Counseling Counseling given: Not Answered   Clinical Intake:  Pre-visit  preparation completed: Yes  Pain : 0-10 Pain Score: 3  Pain Type: Chronic pain Pain Location: Back Pain Orientation: Lower Pain Descriptors / Indicators: Aching Pain Onset: More than a month ago Pain Frequency: Constant     Nutritional Status: BMI > 30  Obese Nutritional Risks: None Diabetes: No  How often do you need to have someone help you when you read instructions, pamphlets, or other written materials from your doctor or pharmacy?: 1 - Never What is the last grade level you completed in school?: 12th grade  Diabetic? no  Interpreter Needed?: No  Information entered by :: NAllen LPN   Activities of Daily Living In your present state of health, do you have any difficulty performing the following activities: 03/21/2021 03/17/2021  Hearing? N  N  Vision? N N  Difficulty concentrating or making decisions? N N  Walking or climbing stairs? Y Y  Comment - -  Dressing or bathing? N N  Doing errands, shopping? N N  Preparing Food and eating ? N N  Using the Toilet? N N  In the past six months, have you accidently leaked urine? N N  Do you have problems with loss of bowel control? N N  Managing your Medications? N N  Managing your Finances? N N  Housekeeping or managing your Housekeeping? N N  Some recent data might be hidden    Patient Care Team: Dorothyann Peng, NP as PCP - General (Family Medicine) Myrle Sheng, MD as Referring Physician (Neurosurgery) Milas Kocher, NP as Nurse Practitioner (Nurse Practitioner)  Indicate any recent Medical Services you may have received from other than [facility] providers in the past year (date may be approximate).     Assessment:   This is a routine wellness examination for [place].  Hearing/Vision screen Vision Screening - Comments:: No regular eye exams, Dr. Amalia Hailey  Dietary issues and exercise activities discussed: Current Exercise Habits: The patient does not participate in regular exercise at present   Goals  Addressed             This Visit's Progress    Patient Stated       03/21/2021, no goals       Depression Screen PHQ 2/9 Scores 03/21/2021 03/23/2020 03/25/2019 11/01/2016  PHQ - 2 Score 0 0 0 0  PHQ- 9 Score - 0 - -    Fall Risk Fall Risk  03/21/2021 03/17/2021 03/23/2020 11/01/2016  Falls in the past year? 0 0 0 No  Number falls in past yr: - 0 0 -  Injury with Fall? - 0 0 -  Risk for fall due to : Medication side effect - Orthopedic patient -  Follow up Falls evaluation completed;Education provided;Falls prevention discussed - Falls evaluation completed;Falls prevention discussed -    FALL RISK PREVENTION PERTAINING TO THE HOME:  Any stairs in or around the home? No  If so, are there any without handrails?  N/a Home free of loose throw rugs in walkways, pet beds, electrical cords, etc? Yes  Adequate lighting in your home to reduce risk of falls? Yes   ASSISTIVE DEVICES UTILIZED TO PREVENT FALLS:  Life alert? No  Use of a cane, walker or w/c? No  Grab bars in the bathroom? Yes  Shower chair or bench in shower? Yes  Elevated toilet seat or a handicapped toilet? Yes   TIMED UP AND GO:  Was the test performed? No .      Cognitive Function:        Immunizations Immunization History  Administered Date(s) Administered   Influenza Split 02/05/2012   Influenza Whole 10/27/2009   Influenza,inj,Quad PF,6+ Mos 11/11/2014, 11/04/2015, 11/02/2017, 03/25/2019, 01/19/2021   Pneumococcal Polysaccharide-23 02/05/2012   Td 06/08/2009    TDAP status: Due, Education has been provided regarding the importance of this vaccine. Advised may receive this vaccine at local pharmacy or Health Dept. Aware to provide a copy of the vaccination record if obtained from local pharmacy or Health Dept. Verbalized acceptance and understanding.  Flu Vaccine status: Up to date  Pneumococcal vaccine status: Due, Education has been provided regarding the importance of this vaccine. Advised may  receive this vaccine at local pharmacy or Health Dept. Aware to provide a copy of the vaccination record if obtained from local pharmacy  or Health Dept. Verbalized acceptance and understanding.  Covid-19 vaccine status: Declined, Education has been provided regarding the importance of this vaccine but patient still declined. Advised may receive this vaccine at local pharmacy or Health Dept.or vaccine clinic. Aware to provide a copy of the vaccination record if obtained from local pharmacy or Health Dept. Verbalized acceptance and understanding.  Qualifies for Shingles Vaccine? Yes   Zostavax completed No   Shingrix Completed?: No.    Education has been provided regarding the importance of this vaccine. Patient has been advised to call insurance company to determine out of pocket expense if they have not yet received this vaccine. Advised may also receive vaccine at local pharmacy or Health Dept. Verbalized acceptance and understanding.  Screening Tests Health Maintenance  Topic Date Due   Zoster Vaccines- Shingrix (1 of 2) Never done   TETANUS/TDAP  06/09/2019   COVID-19 Vaccine (1) 04/06/2021 (Originally 01/15/1956)   COLONOSCOPY (Pts 45-100yrs Insurance coverage will need to be confirmed)  08/29/2023   INFLUENZA VACCINE  Completed   Hepatitis C Screening  Completed   HIV Screening  Completed   HPV VACCINES  Aged Out    Health Maintenance  Health Maintenance Due  Topic Date Due   Zoster Vaccines- Shingrix (1 of 2) Never done   TETANUS/TDAP  06/09/2019    Colorectal cancer screening: Type of screening: Colonoscopy. Completed 08/29/2018. Repeat every 5 years  Lung Cancer Screening: (Low Dose CT Chest recommended if Age 11-80 years, 30 pack-year currently smoking OR have quit w/in 15years.) does not qualify.   Lung Cancer Screening Referral: no  Additional Screening:  Hepatitis C Screening: does qualify; Completed 11/02/2017  Vision Screening: Recommended annual ophthalmology exams  for early detection of glaucoma and other disorders of the eye. Is the patient up to date with their annual eye exam?  No  Who is the provider or what is the name of the office in which the patient attends annual eye exams? Dr. Amalia Hailey If pt is not established with a provider, would they like to be referred to a provider to establish care? No .   Dental Screening: Recommended annual dental exams for proper oral hygiene  Community Resource Referral / Chronic Care Management: CRR required this visit?  No   CCM required this visit?  No      Plan:     I have personally reviewed and noted the following in the patients chart:   Medical and social history Use of alcohol, tobacco or illicit drugs  Current medications and supplements including opioid prescriptions. Patient is not currently taking opioid prescriptions. Functional ability and status Nutritional status Physical activity Advanced directives List of other physicians Hospitalizations, surgeries, and ER visits in previous 12 months Vitals Screenings to include cognitive, depression, and falls Referrals and appointments  In addition, I have reviewed and discussed with patient certain preventive protocols, quality metrics, and best practice recommendations. A written personalized care plan for preventive services as well as general preventive health recommendations were provided to patient.     Kellie Simmering, LPN   1/61/0960   Nurse Notes: 6 CIT not administered. Patient appeared cognitive per conversation.

## 2021-03-21 NOTE — Patient Instructions (Signed)
Tommy Jackson , Thank you for taking time to come for your Medicare Wellness Visit. I appreciate your ongoing commitment to your health goals. Please review the following plan we discussed and let me know if I can assist you in the future.   Screening recommendations/referrals: Colonoscopy: completed 08/29/2018, due 08/29/2023 Recommended yearly ophthalmology/optometry visit for glaucoma screening and checkup Recommended yearly dental visit for hygiene and checkup  Vaccinations: Influenza vaccine: completed 01/19/2021 Pneumococcal vaccine: n/a Tdap vaccine: due Shingles vaccine: discussed   Covid-19:  decline  Advanced directives: Please bring a copy of your POA (Power of Attorney) and/or Living Will to your next appointment.   Conditions/risks identified: none  Next appointment: Follow up in one year for your annual wellness visit   Preventive Care 40-64 Years, Male Preventive care refers to lifestyle choices and visits with your health care provider that can promote health and wellness. What does preventive care include? A yearly physical exam. This is also called an annual well check. Dental exams once or twice a year. Routine eye exams. Ask your health care provider how often you should have your eyes checked. Personal lifestyle choices, including: Daily care of your teeth and gums. Regular physical activity. Eating a healthy diet. Avoiding tobacco and drug use. Limiting alcohol use. Practicing safe sex. Taking low-dose aspirin every day starting at age 19. What happens during an annual well check? The services and screenings done by your health care provider during your annual well check will depend on your age, overall health, lifestyle risk factors, and family history of disease. Counseling  Your health care provider may ask you questions about your: Alcohol use. Tobacco use. Drug use. Emotional well-being. Home and relationship well-being. Sexual activity. Eating  habits. Work and work Statistician. Screening  You may have the following tests or measurements: Height, weight, and BMI. Blood pressure. Lipid and cholesterol levels. These may be checked every 5 years, or more frequently if you are over 66 years old. Skin check. Lung cancer screening. You may have this screening every year starting at age 61 if you have a 30-pack-year history of smoking and currently smoke or have quit within the past 15 years. Fecal occult blood test (FOBT) of the stool. You may have this test every year starting at age 5. Flexible sigmoidoscopy or colonoscopy. You may have a sigmoidoscopy every 5 years or a colonoscopy every 10 years starting at age 4. Prostate cancer screening. Recommendations will vary depending on your family history and other risks. Hepatitis C blood test. Hepatitis B blood test. Sexually transmitted disease (STD) testing. Diabetes screening. This is done by checking your blood sugar (glucose) after you have not eaten for a while (fasting). You may have this done every 1-3 years. Discuss your test results, treatment options, and if necessary, the need for more tests with your health care provider. Vaccines  Your health care provider may recommend certain vaccines, such as: Influenza vaccine. This is recommended every year. Tetanus, diphtheria, and acellular pertussis (Tdap, Td) vaccine. You may need a Td booster every 10 years. Zoster vaccine. You may need this after age 6. Pneumococcal 13-valent conjugate (PCV13) vaccine. You may need this if you have certain conditions and have not been vaccinated. Pneumococcal polysaccharide (PPSV23) vaccine. You may need one or two doses if you smoke cigarettes or if you have certain conditions. Talk to your health care provider about which screenings and vaccines you need and how often you need them. This information is not intended to replace advice  given to you by your health care provider. Make sure you  discuss any questions you have with your health care provider. Document Released: 03/12/2015 Document Revised: 11/03/2015 Document Reviewed: 12/15/2014 Elsevier Interactive Patient Education  2017 [place] Prevention in the Home Falls can cause injuries. They can happen to people of all ages. There are many things you can do to make your home safe and to help prevent falls. What can I do on the outside of my home? Regularly fix the edges of walkways and driveways and fix any cracks. Remove anything that might make you trip as you walk through a door, such as a raised step or threshold. Trim any bushes or trees on the path to your home. Use bright outdoor lighting. Clear any walking paths of anything that might make someone trip, such as rocks or tools. Regularly check to see if handrails are loose or broken. Make sure that both sides of any steps have handrails. Any raised decks and porches should have guardrails on the edges. Have any leaves, snow, or ice cleared regularly. Use sand or salt on walking paths during winter. Clean up any spills in your garage right away. This includes oil or grease spills. What can I do in the bathroom? Use night lights. Install grab bars by the toilet and in the tub and shower. Do not use towel bars as grab bars. Use non-skid mats or decals in the tub or shower. If you need to sit down in the shower, use a plastic, non-slip stool. Keep the floor dry. Clean up any water that spills on the floor as soon as it happens. Remove soap buildup in the tub or shower regularly. Attach bath mats securely with double-sided non-slip rug tape. Do not have throw rugs and other things on the floor that can make you trip. What can I do in the bedroom? Use night lights. Make sure that you have a light by your bed that is easy to reach. Do not use any sheets or blankets that are too big for your bed. They should not hang down onto the floor. Have a firm chair  that has side arms. You can use this for support while you get dressed. Do not have throw rugs and other things on the floor that can make you trip. What can I do in the kitchen? Clean up any spills right away. Avoid walking on wet floors. Keep items that you use a lot in easy-to-reach places. If you need to reach something above you, use a strong step stool that has a grab bar. Keep electrical cords out of the way. Do not use floor polish or wax that makes floors slippery. If you must use wax, use non-skid floor wax. Do not have throw rugs and other things on the floor that can make you trip. What can I do with my stairs? Do not leave any items on the stairs. Make sure that there are handrails on both sides of the stairs and use them. Fix handrails that are broken or loose. Make sure that handrails are as long as the stairways. Check any carpeting to make sure that it is firmly attached to the stairs. Fix any carpet that is loose or worn. Avoid having throw rugs at the top or bottom of the stairs. If you do have throw rugs, attach them to the floor with carpet tape. Make sure that you have a light switch at the top of the stairs and the bottom of  the stairs. If you do not have them, ask someone to add them for you. What else can I do to help prevent falls? Wear shoes that: Do not have high heels. Have rubber bottoms. Are comfortable and fit you well. Are closed at the toe. Do not wear sandals. If you use a stepladder: Make sure that it is fully opened. Do not climb a closed stepladder. Make sure that both sides of the stepladder are locked into place. Ask someone to hold it for you, if possible. Clearly mark and make sure that you can see: Any grab bars or handrails. First and last steps. Where the edge of each step is. Use tools that help you move around (mobility aids) if they are needed. These include: Canes. Walkers. Scooters. Crutches. Turn on the lights when you go into a  dark area. Replace any light bulbs as soon as they burn out. Set up your furniture so you have a clear path. Avoid moving your furniture around. If any of your floors are uneven, fix them. If there are any pets around you, be aware of where they are. Review your medicines with your doctor. Some medicines can make you feel dizzy. This can increase your chance of falling. Ask your doctor what other things that you can do to help prevent falls. This information is not intended to replace advice given to you by your health care provider. Make sure you discuss any questions you have with your health care provider. Document Released: 12/10/2008 Document Revised: 07/22/2015 Document Reviewed: 03/20/2014 Elsevier Interactive Patient Education  2017 [employer].

## 2021-04-06 ENCOUNTER — Other Ambulatory Visit: Payer: Self-pay

## 2021-04-06 ENCOUNTER — Ambulatory Visit (INDEPENDENT_AMBULATORY_CARE_PROVIDER_SITE_OTHER): Payer: Medicare HMO

## 2021-04-06 ENCOUNTER — Ambulatory Visit (INDEPENDENT_AMBULATORY_CARE_PROVIDER_SITE_OTHER): Payer: Medicare HMO | Admitting: Adult Health

## 2021-04-06 ENCOUNTER — Encounter: Payer: Self-pay | Admitting: Adult Health

## 2021-04-06 VITALS — BP 136/84 | HR 110 | Temp 98.7°F | Ht 72.0 in | Wt 317.0 lb

## 2021-04-06 DIAGNOSIS — M79672 Pain in left foot: Secondary | ICD-10-CM

## 2021-04-06 DIAGNOSIS — I10 Essential (primary) hypertension: Secondary | ICD-10-CM | POA: Diagnosis not present

## 2021-04-06 DIAGNOSIS — E1169 Type 2 diabetes mellitus with other specified complication: Secondary | ICD-10-CM | POA: Diagnosis not present

## 2021-04-06 LAB — POCT GLYCOSYLATED HEMOGLOBIN (HGB A1C): Hemoglobin A1C: 6.2 % — AB (ref 4.0–5.6)

## 2021-04-06 NOTE — Progress Notes (Signed)
Subjective:    Patient ID: Tommy Jackson, male    DOB: Jul 01, 1955, 66 y.o.   MRN: [id number]  HPI 66 year old male who  has a past medical history of Allergy, ASTHMA (09/07/2009), Herniated disc, HYPERLIPIDEMIA (02/07/2007), HYPERTENSION (11/29/2005), OBESITY (10/01/2007), Sleep apnea, and SLEEP APNEA, OBSTRUCTIVE (10/01/2007).  He presents to the office today for follow up regarding HTN and DM   DM -he is currently managed with Trulicity 1.5 mg weekly.  He has not had any side effects of this medication.  Does report early satiety has helped him cut back on portion sizes but continues to snack in the late night.   In the past we have trialed him on metformin but this caused subsequently stopped.  Wt Readings from Last 10 Encounters:  04/06/21 (!) 317 lb (143.8 kg)  03/21/21 (!) 308 lb (139.7 kg)  03/03/21 (!) 316 lb 6.4 oz (143.5 kg)  01/18/21 (!) 313 lb (142 kg)  09/16/20 (!) 323 lb (146.5 kg)  08/04/20 (!) 325 lb (147.4 kg)  06/02/20 (!) 334 lb 9.6 oz (151.8 kg)  03/25/19 (!) 326 lb (147.9 kg)  08/29/18 300 lb (136.1 kg)  08/15/18 300 lb (136.1 kg)    HTN -blood pressure is controlled with Norvasc 10 mg daily, Diovan/HCTZ 320-25 mg daily, Lopressor 100 mg daily and Lasix 20 mg daily.  He denies dizziness, lightheadedness, chest pain, shortness of breath, or syncopal episodes BP Readings from Last 3 Encounters:  04/06/21 136/84  03/03/21 136/82  01/18/21 130/80   Left foot pain - acute issue. Two weeks ago he was bending over to get something of the floor and felt a popping sensation in his left foot. Denies swelling or bruising at this time. Continues to have pain across the top of his foot closest to the toes and along the lateral aspect of his foot - only when walking. No pain with palpation   Review of Systems See HPI   Past Medical History:  Diagnosis Date   Allergy    ASTHMA 09/07/2009   Herniated disc    HYPERLIPIDEMIA 02/07/2007   HYPERTENSION 11/29/2005   OBESITY  10/01/2007   Sleep apnea    cpap   SLEEP APNEA, OBSTRUCTIVE 10/01/2007    Social History   Socioeconomic History   Marital status: Married    Spouse name: Not on file   Number of children: 3   Years of education: Not on file   Highest education level: Not on file  Occupational History   Occupation: retired truck driver,long term disability    Employer: RETIRED  Tobacco Use   Smoking status: Never   Smokeless tobacco: Never  Vaping Use   Vaping Use: Never used  Substance and Sexual Activity   Alcohol use: Yes    Alcohol/week: 0.0 standard drinks    Comment: rare alcohol intake   Drug use: No   Sexual activity: Not on file  Other Topics Concern   Not on file  Social History Narrative   Not on file   Social Determinants of Health   Financial Resource Strain: Unknown   Difficulty of Paying Living Expenses: Patient refused  Food Insecurity: Unknown   Worried About Running Out of Food in the Last Year: Patient refused   [place] in the Last Year: Patient refused  Transportation Needs: Unknown   Film/video editor (Medical): Patient refused   Lack of Transportation (Non-Medical): Patient refused  Physical Activity: Unknown   Days of Exercise per Week:  Patient refused   Minutes of Exercise per Session: 0 min  Stress: No Stress Concern Present   Feeling of Stress : Not at all  Social Connections: Unknown   Frequency of Communication with Friends and Family: Patient refused   Frequency of Social Gatherings with Friends and Family: Patient refused   Attends Religious Services: Patient refused   Marine scientist or Organizations: Patient refused   Attends Music therapist: Not on file   Marital Status: Patient refused  Intimate Partner Violence: Not on file    Past Surgical History:  Procedure Laterality Date   bicep surgery Right 2014   COLONOSCOPY     ELBOW SURGERY Right 2013   POLYPECTOMY     removal disc neck  2015   SHOULDER SURGERY  Right 1990    Family History  Problem Relation Age of Onset   Colon cancer Father 29   Lung cancer Father    CAD Father    Diabetes Father    Heart attack Father    Colon cancer Brother 39   Heart attack Paternal Grandfather    CAD Brother    Esophageal cancer Neg Hx    Stomach cancer Neg Hx    Rectal cancer Neg Hx     Allergies  Allergen Reactions   Aspirin     REACTION: unspecified   Glycerin Nausea Only   Penicillins     REACTION: family hx   Metformin And Related Diarrhea    Current Outpatient Medications on File Prior to Visit  Medication Sig Dispense Refill   albuterol (VENTOLIN HFA) 108 (90 Base) MCG/ACT inhaler Inhale 2 puffs into the lungs every 4 (four) hours as needed. 6.7 g 3   amLODipine (NORVASC) 10 MG tablet TAKE 1 TABLET EVERY DAY 90 tablet 3   Ascorbic Acid (VITAMIN C) 100 MG tablet Take 100 mg by mouth daily.     azelastine (ASTELIN) 0.1 % nasal spray one spray by Both Nostrils route 2 (two) times daily. Use in each nostril as directed     cetirizine (ZYRTEC) 10 MG tablet Take by mouth.     Dulaglutide (TRULICITY) 1.5 NI/7.7OE SOPN Inject 1.5 mg into the skin once a week.     fenofibrate (TRICOR) 145 MG tablet TAKE 1 TABLET (145 MG TOTAL) BY MOUTH DAILY. 90 tablet 3   furosemide (LASIX) 20 MG tablet TAKE 1 TABLET EVERY DAY 90 tablet 3   gabapentin (NEURONTIN) 400 MG capsule Take by mouth.     meloxicam (MOBIC) 15 MG tablet Take 15 mg by mouth daily.     metoprolol tartrate (LOPRESSOR) 100 MG tablet TAKE 1 TABLET TWICE DAILY 180 tablet 3   Multiple Vitamin (MULTIVITAMIN) capsule Take 1 capsule by mouth daily.     potassium chloride (KLOR-CON) 10 MEQ tablet TAKE 1 TABLET EVERY DAY 90 tablet 3   pravastatin (PRAVACHOL) 40 MG tablet TAKE 1 TABLET (40 MG TOTAL) BY MOUTH DAILY. 90 tablet 3   valsartan-hydrochlorothiazide (DIOVAN-HCT) 320-25 MG tablet TAKE 1 TABLET EVERY DAY 90 tablet 3   No current facility-administered medications on file prior to visit.     BP 136/84    Pulse (!) 110    Temp 98.7 F (37.1 C) (Oral)    Ht 6' (1.829 m)    Wt (!) 317 lb (143.8 kg)    SpO2 100%    BMI 42.99 kg/m       Objective:   Physical Exam Vitals and nursing note reviewed.  Constitutional:      Appearance: Normal appearance. He is obese.  Musculoskeletal:        General: Normal range of motion.     Right foot: Normal.     Left foot: Normal. Normal range of motion. No tenderness, bony tenderness or crepitus.     Comments: Has limping gait   Skin:    General: Skin is warm and dry.     Capillary Refill: Capillary refill takes less than 2 seconds.  Neurological:     General: No focal deficit present.     Mental Status: He is alert and oriented to person, place, and time.  Psychiatric:        Mood and Affect: Mood normal.        Behavior: Behavior normal.        Thought Content: Thought content normal.        Judgment: Judgment normal.       Assessment & Plan:  1. Left foot pain  - DG Foot Complete Left; Future  2. Type 2 diabetes mellitus with other specified complication, unspecified whether long term insulin use (HCC)  - POC HgB A1c- 6.2 - Continue with Trulicity 1.5 mg  - Follow up at [facility] in 6 months   3. Morbid obesity ([place]) - Work on reducing evening snacking   4. Essential hypertension - Controlled    Dorothyann Peng, NP

## 2021-09-12 DIAGNOSIS — H02834 Dermatochalasis of left upper eyelid: Secondary | ICD-10-CM | POA: Diagnosis not present

## 2021-09-12 DIAGNOSIS — H2513 Age-related nuclear cataract, bilateral: Secondary | ICD-10-CM | POA: Diagnosis not present

## 2021-09-12 DIAGNOSIS — H524 Presbyopia: Secondary | ICD-10-CM | POA: Diagnosis not present

## 2021-09-12 DIAGNOSIS — E119 Type 2 diabetes mellitus without complications: Secondary | ICD-10-CM | POA: Diagnosis not present

## 2021-09-12 DIAGNOSIS — H5203 Hypermetropia, bilateral: Secondary | ICD-10-CM | POA: Diagnosis not present

## 2021-09-12 DIAGNOSIS — H43393 Other vitreous opacities, bilateral: Secondary | ICD-10-CM | POA: Diagnosis not present

## 2021-09-12 DIAGNOSIS — H52203 Unspecified astigmatism, bilateral: Secondary | ICD-10-CM | POA: Diagnosis not present

## 2021-09-12 DIAGNOSIS — H02831 Dermatochalasis of right upper eyelid: Secondary | ICD-10-CM | POA: Diagnosis not present

## 2021-10-13 ENCOUNTER — Other Ambulatory Visit: Payer: Self-pay

## 2021-10-13 ENCOUNTER — Telehealth: Payer: Self-pay | Admitting: Adult Health

## 2021-10-13 DIAGNOSIS — R600 Localized edema: Secondary | ICD-10-CM

## 2021-10-13 DIAGNOSIS — I10 Essential (primary) hypertension: Secondary | ICD-10-CM

## 2021-10-13 DIAGNOSIS — E785 Hyperlipidemia, unspecified: Secondary | ICD-10-CM

## 2021-10-13 DIAGNOSIS — J4520 Mild intermittent asthma, uncomplicated: Secondary | ICD-10-CM

## 2021-10-13 MED ORDER — AMLODIPINE BESYLATE 10 MG PO TABS: 10.0000 mg | ORAL_TABLET | Freq: Every day | ORAL | 0 refills | Status: DC

## 2021-10-13 MED ORDER — FENOFIBRATE 145 MG PO TABS: 145.0000 mg | ORAL_TABLET | Freq: Every day | ORAL | 0 refills | Status: DC

## 2021-10-13 NOTE — Telephone Encounter (Signed)
Rx refilled. Pt notified of update.

## 2021-10-13 NOTE — Telephone Encounter (Signed)
Pt call and stated he need a refill on amLODipine (NORVASC) 10 MG tablet and fenofibrate (TRICOR) 145 MG tablet  sent to [facility] ,[place]

## 2021-10-18 ENCOUNTER — Encounter: Payer: Self-pay | Admitting: Adult Health

## 2021-10-18 ENCOUNTER — Ambulatory Visit (INDEPENDENT_AMBULATORY_CARE_PROVIDER_SITE_OTHER): Payer: Medicare Other | Admitting: Adult Health

## 2021-10-18 VITALS — BP 130/82 | HR 70 | Temp 97.6°F | Ht 72.0 in | Wt 322.0 lb

## 2021-10-18 DIAGNOSIS — M79672 Pain in left foot: Secondary | ICD-10-CM

## 2021-10-18 NOTE — Progress Notes (Signed)
Subjective:    Patient ID: Tommy Jackson, male    DOB: [birth year], 66 y.o.   MRN: [id number]  HPI 66 year old male who is being evaluated today for an acute issue.  He reports 2 weeks ago he put his foot down onto the ground and felt something pop up, had a throbbing pain afterwards but as the 2 weeks has gone on pain has improved to the throbbing pain occasionally.  He did notice some swelling along the top of his foot.  Pain sensation was felt to the MP joint of the middle toe   He did have an xray down in February 2023 of his left foot which showed IMPRESSION: 1. Diffuse degenerative change, most prominent about the first metatarsal phalangeal and first interphalangeal joints. No acute abnormality identified.  Review of Systems See HPI   Past Medical History:  Diagnosis Date   Allergy    ASTHMA 09/07/2009   Herniated disc    HYPERLIPIDEMIA 02/07/2007   HYPERTENSION 11/29/2005   OBESITY 10/01/2007   Sleep apnea    cpap   SLEEP APNEA, OBSTRUCTIVE 10/01/2007    Social History   Socioeconomic History   Marital status: Married    Spouse name: Not on file   Number of children: 3   Years of education: Not on file   Highest education level: Not on file  Occupational History   Occupation: retired truck driver,long term disability    Employer: RETIRED  Tobacco Use   Smoking status: Never   Smokeless tobacco: Never  Vaping Use   Vaping Use: Never used  Substance and Sexual Activity   Alcohol use: Yes    Alcohol/week: 0.0 standard drinks of alcohol    Comment: rare alcohol intake   Drug use: No   Sexual activity: Not on file  Other Topics Concern   Not on file  Social History Narrative   Not on file   Social Determinants of Health   Financial Resource Strain: Unknown (04/02/2021)   Overall Financial Resource Strain (CARDIA)    Difficulty of Paying Living Expenses: Patient refused  Food Insecurity: Unknown (04/02/2021)   Hunger Vital Sign    Worried About Running Out  of Food in the Last Year: Patient refused    [place] in the Last Year: Patient refused  Transportation Needs: Unknown (04/02/2021)   PRAPARE - Transportation    Lack of Transportation (Medical): Patient refused    Lack of Transportation (Non-Medical): Patient refused  Physical Activity: Unknown (04/02/2021)   Exercise Vital Sign    Days of Exercise per Week: Patient refused    Minutes of Exercise per Session: 0 min  Recent Concern: Physical Activity - Inactive (03/21/2021)   Exercise Vital Sign    Days of Exercise per Week: 0 days    Minutes of Exercise per Session: 0 min  Stress: No Stress Concern Present (04/02/2021)   [place] of Stress : Not at all  Social Connections: Unknown (04/02/2021)   Social Connection and Isolation Panel [NHANES]    Frequency of Communication with Friends and Family: Patient refused    Frequency of Social Gatherings with Friends and Family: Patient refused    Attends Religious Services: Patient refused    Active Member of Clubs or Organizations: Patient refused    Attends Archivist Meetings: Not on file    Marital Status: Patient refused  Intimate Partner Violence: Not At Risk (03/23/2020)  Humiliation, Afraid, Rape, and Kick questionnaire    Fear of Current or Ex-Partner: No    Emotionally Abused: No    Physically Abused: No    Sexually Abused: No    Past Surgical History:  Procedure Laterality Date   bicep surgery Right 2014   COLONOSCOPY     ELBOW SURGERY Right 2013   POLYPECTOMY     removal disc neck  2015   SHOULDER SURGERY Right 1990    Family History  Problem Relation Age of Onset   Colon cancer Father 47   Lung cancer Father    CAD Father    Diabetes Father    Heart attack Father    Colon cancer Brother 7   Heart attack Paternal Grandfather    CAD Brother    Esophageal cancer Neg Hx    Stomach cancer Neg Hx    Rectal cancer Neg Hx      Allergies  Allergen Reactions   Aspirin     REACTION: unspecified   Glycerin Nausea Only   Penicillins     REACTION: family hx   Metformin And Related Diarrhea    Current Outpatient Medications on File Prior to Visit  Medication Sig Dispense Refill   albuterol (VENTOLIN HFA) 108 (90 Base) MCG/ACT inhaler Inhale 2 puffs into the lungs every 4 (four) hours as needed. 6.7 g 3   amLODipine (NORVASC) 10 MG tablet Take 1 tablet (10 mg total) by mouth daily. 90 tablet 0   Ascorbic Acid (VITAMIN C) 100 MG tablet Take 100 mg by mouth daily.     azelastine (ASTELIN) 0.1 % nasal spray Place into the nose.     cetirizine (ZYRTEC) 10 MG tablet Take by mouth.     Dulaglutide (TRULICITY) 1.5 VV/6.1YW SOPN Inject 1.5 mg into the skin once a week.     fenofibrate (TRICOR) 145 MG tablet Take 1 tablet (145 mg total) by mouth daily. 90 tablet 0   fluticasone (FLONASE) 50 MCG/ACT nasal spray Place into the nose.     furosemide (LASIX) 20 MG tablet TAKE 1 TABLET EVERY DAY 90 tablet 3   meloxicam (MOBIC) 15 MG tablet Take 1 tablet by mouth daily.     metoprolol tartrate (LOPRESSOR) 100 MG tablet TAKE 1 TABLET TWICE DAILY 180 tablet 3   Multiple Vitamin (MULTIVITAMIN) capsule Take 1 capsule by mouth daily.     potassium chloride (KLOR-CON) 10 MEQ tablet TAKE 1 TABLET EVERY DAY 90 tablet 3   pravastatin (PRAVACHOL) 40 MG tablet TAKE 1 TABLET (40 MG TOTAL) BY MOUTH DAILY. 90 tablet 3   valsartan-hydrochlorothiazide (DIOVAN-HCT) 320-25 MG tablet TAKE 1 TABLET EVERY DAY 90 tablet 3   No current facility-administered medications on file prior to visit.    BP 130/82   Pulse 70   Temp 97.6 F (36.4 C) (Oral)   Ht 6' (1.829 m)   Wt (!) 322 lb (146.1 kg)   SpO2 95%   BMI 43.67 kg/m       Objective:   Physical Exam Vitals and nursing note reviewed.  Constitutional:      Appearance: Normal appearance.  Musculoskeletal:        General: No swelling, tenderness or deformity. Normal range of motion.      Left foot: Normal range of motion and normal capillary refill. No swelling, deformity or bony tenderness. Normal pulse.  Skin:    Capillary Refill: Capillary refill takes less than 2 seconds.  Neurological:     General: No focal  deficit present.     Mental Status: He is alert and oriented to person, place, and time.  Psychiatric:        Mood and Affect: Mood normal.        Behavior: Behavior normal.        Thought Content: Thought content normal.       Assessment & Plan:  1. Left foot pain -No signs of trauma or injury.  Likely pain from osteoarthritis.  Can continue with conservative measures  Dorothyann Peng, NP

## 2021-11-02 DIAGNOSIS — M4726 Other spondylosis with radiculopathy, lumbar region: Secondary | ICD-10-CM | POA: Diagnosis not present

## 2021-11-02 DIAGNOSIS — M5136 Other intervertebral disc degeneration, lumbar region: Secondary | ICD-10-CM | POA: Diagnosis not present

## 2021-11-02 DIAGNOSIS — M48062 Spinal stenosis, lumbar region with neurogenic claudication: Secondary | ICD-10-CM | POA: Diagnosis not present

## 2021-12-20 ENCOUNTER — Encounter: Payer: Self-pay | Admitting: Primary Care

## 2021-12-20 ENCOUNTER — Ambulatory Visit (INDEPENDENT_AMBULATORY_CARE_PROVIDER_SITE_OTHER): Payer: Medicare Other | Admitting: Primary Care

## 2021-12-20 VITALS — BP 162/84 | HR 75 | Temp 98.6°F | Ht 72.0 in | Wt 327.8 lb

## 2021-12-20 DIAGNOSIS — G4733 Obstructive sleep apnea (adult) (pediatric): Secondary | ICD-10-CM

## 2021-12-20 DIAGNOSIS — M48061 Spinal stenosis, lumbar region without neurogenic claudication: Secondary | ICD-10-CM | POA: Diagnosis not present

## 2021-12-20 DIAGNOSIS — M48062 Spinal stenosis, lumbar region with neurogenic claudication: Secondary | ICD-10-CM | POA: Diagnosis not present

## 2021-12-20 DIAGNOSIS — G894 Chronic pain syndrome: Secondary | ICD-10-CM | POA: Diagnosis not present

## 2021-12-20 DIAGNOSIS — M4726 Other spondylosis with radiculopathy, lumbar region: Secondary | ICD-10-CM | POA: Diagnosis not present

## 2021-12-20 DIAGNOSIS — M5136 Other intervertebral disc degeneration, lumbar region: Secondary | ICD-10-CM | POA: Diagnosis not present

## 2021-12-20 NOTE — Progress Notes (Signed)
$'@Patient'B$  ID: [name], male    DOB: 04/24/1955, 66 y.o.   MRN: [id number]  Chief Complaint  Patient presents with   Follow-up    Referring provider: Dorothyann Peng, NP  HPI: 66 year old male, never smoked.  Past medical history significant for OSA, asthma, allergic rhinitis, HTN, prediabetes.  Patient of Dr. Elsworth Soho, last seen in office on 03/03/2021.   12/20/2021 Patient presents today for overdue follow-up for OSA.  He is doing well today without acute complaints.  He is sleeping comfortably.  He is maintained on auto CPAP 10 to 16 cm H2O.  He gets on average 7 hours of sleep at night.  No significant daytime sleepiness.  He takes an afternoon nap and recliner.  His weight is up 10 pounds since January.  He reports not being able to exercise due to chronic back pain, he is following with Dr. Francesco Runner for injections.   Airview download 11/19/2021 - 12/18/2021 30/30 days used; 97% greater than 4 hours Average usage 7 hours Pressure 10 to 16 cm H2O (14.6 cm H2O-95th percentile) Air leaks 9.3 L/min (95%)  AHI 5.1  Allergies  Allergen Reactions   Aspirin     REACTION: unspecified   Glycerin Nausea Only   Penicillins     REACTION: family hx   Metformin And Related Diarrhea    Immunization History  Administered Date(s) Administered   Influenza Split 02/05/2012   Influenza Whole 10/27/2009   Influenza,inj,Quad PF,6+ Mos 11/11/2014, 11/04/2015, 11/02/2017, 03/25/2019, 01/19/2021   Pneumococcal Polysaccharide-23 02/05/2012   Td 06/08/2009    Past Medical History:  Diagnosis Date   Allergy    ASTHMA 09/07/2009   Herniated disc    HYPERLIPIDEMIA 02/07/2007   HYPERTENSION 11/29/2005   OBESITY 10/01/2007   Sleep apnea    cpap   SLEEP APNEA, OBSTRUCTIVE 10/01/2007    Tobacco History: Social History   Tobacco Use  Smoking Status Never  Smokeless Tobacco Never   Counseling given: Not Answered   Outpatient Medications Prior to Visit  Medication Sig Dispense Refill    albuterol (VENTOLIN HFA) 108 (90 Base) MCG/ACT inhaler Inhale 2 puffs into the lungs every 4 (four) hours as needed. 6.7 g 3   amLODipine (NORVASC) 10 MG tablet Take 1 tablet (10 mg total) by mouth daily. 90 tablet 0   Ascorbic Acid (VITAMIN C) 100 MG tablet Take 100 mg by mouth daily.     azelastine (ASTELIN) 0.1 % nasal spray Place into the nose.     cetirizine (ZYRTEC) 10 MG tablet Take by mouth.     Dulaglutide (TRULICITY) 1.5 IP/3.8SN SOPN Inject 1.5 mg into the skin once a week.     fenofibrate (TRICOR) 145 MG tablet Take 1 tablet (145 mg total) by mouth daily. 90 tablet 0   fluticasone (FLONASE) 50 MCG/ACT nasal spray Place into the nose.     furosemide (LASIX) 20 MG tablet TAKE 1 TABLET EVERY DAY 90 tablet 3   meloxicam (MOBIC) 15 MG tablet Take 1 tablet by mouth daily.     metoprolol tartrate (LOPRESSOR) 100 MG tablet TAKE 1 TABLET TWICE DAILY 180 tablet 3   Multiple Vitamin (MULTIVITAMIN) capsule Take 1 capsule by mouth daily.     potassium chloride (KLOR-CON) 10 MEQ tablet TAKE 1 TABLET EVERY DAY 90 tablet 3   pravastatin (PRAVACHOL) 40 MG tablet TAKE 1 TABLET (40 MG TOTAL) BY MOUTH DAILY. 90 tablet 3   valsartan-hydrochlorothiazide (DIOVAN-HCT) 320-25 MG tablet TAKE 1 TABLET EVERY DAY 90 tablet 3  No facility-administered medications prior to visit.   Review of Systems  Review of Systems  Constitutional: Negative.  Negative for fatigue.  HENT: Negative.    Eyes: Negative.   Respiratory: Negative.  Negative for apnea.   Psychiatric/Behavioral:  Negative for sleep disturbance.     Physical Exam  BP (!) 162/84 (BP Location: Right Arm, Patient Position: Sitting, Cuff Size: Large)   Pulse 75   Temp 98.6 F (37 C) (Oral)   Ht 6' (1.829 m)   Wt (!) 327 lb 12.8 oz (148.7 kg)   SpO2 99%   BMI 44.46 kg/m  Physical Exam Constitutional:      General: He is not in acute distress.    Appearance: Normal appearance. He is obese. He is not ill-appearing.  HENT:     Head:  Normocephalic and atraumatic.  Cardiovascular:     Rate and Rhythm: Normal rate and regular rhythm.  Pulmonary:     Effort: Pulmonary effort is normal.     Breath sounds: Normal breath sounds.  Musculoskeletal:        General: Normal range of motion.  Skin:    General: Skin is warm.  Neurological:     General: No focal deficit present.     Mental Status: He is alert and oriented to person, place, and time. Mental status is at baseline.  Psychiatric:        Mood and Affect: Mood normal.        Behavior: Behavior normal.        Thought Content: Thought content normal.        Judgment: Judgment normal.      Lab Results:  CBC    Component Value Date/Time   WBC 6.5 09/16/2020 1109   RBC 4.73 09/16/2020 1109   HGB 14.7 09/16/2020 1109   HCT 43.2 09/16/2020 1109   PLT 189.0 09/16/2020 1109   MCV 91.3 09/16/2020 1109   MCH 31.9 05/06/2012 2330   MCHC 34.0 09/16/2020 1109   RDW 13.8 09/16/2020 1109   LYMPHSABS 1.9 09/16/2020 1109   MONOABS 0.5 09/16/2020 1109   EOSABS 0.3 09/16/2020 1109   BASOSABS 0.0 09/16/2020 1109    BMET    Component Value Date/Time   NA 141 09/16/2020 1109   K 3.8 09/16/2020 1109   CL 102 09/16/2020 1109   CO2 29 09/16/2020 1109   GLUCOSE 128 (H) 09/16/2020 1109   BUN 19 09/16/2020 1109   CREATININE 0.97 09/16/2020 1109   CALCIUM 9.9 09/16/2020 1109   GFRNONAA >90 05/06/2012 2330   GFRAA >90 05/06/2012 2330    BNP No results found for: "BNP"  ProBNP No results found for: "PROBNP"  Imaging: No results found.   Assessment & Plan:   Obstructive sleep apnea - NPSG 2012 showed severe obstructive sleep apnea, AHI 88 an hour.  Patient is 100% compliant with CPAP use over the last 30 days and reports benefit in sleep quality.  Current pressure setting 10 to 16 cm H2O; AHI 5.1 (14.6 cm H2O-95th percentile).  We will increase maximum pressure 18 cm H2O d/t mild residual apneas.  Encourage weight loss efforts.  Advised against driving  experiencing excessive daytime sleepiness fatigue.  Follow-up in 1 year or sooner if needed.     Martyn Ehrich, NP 12/20/2021

## 2021-12-20 NOTE — Patient Instructions (Signed)
Excellent compliance with CPAP You have very mild residual apneas on current CPAP pressure settings We will increase maximum CPAP pressure from 16 cm H2O to 18 cm H2O  Recommendations: Continue CPAP every night for minimum 4 to 6 hours or longer  Orders Adjust CPAP pressure 10 to 18 cm H2O  Follow-up 1 year with Dr. Elsworth Soho or sooner if needed

## 2021-12-20 NOTE — Assessment & Plan Note (Signed)
-   NPSG 2012 showed severe obstructive sleep apnea, AHI 88 an hour.  Patient is 100% compliant with CPAP use over the last 30 days and reports benefit in sleep quality.  Current pressure setting 10 to 16 cm H2O; AHI 5.1 (14.6 cm H2O-95th percentile).  We will increase maximum pressure 18 cm H2O d/t mild residual apneas.  Encourage weight loss efforts.  Advised against driving experiencing excessive daytime sleepiness fatigue.  Follow-up in 1 year or sooner if needed.

## 2022-01-07 ENCOUNTER — Other Ambulatory Visit: Payer: Self-pay | Admitting: Adult Health

## 2022-01-07 DIAGNOSIS — E785 Hyperlipidemia, unspecified: Secondary | ICD-10-CM

## 2022-01-07 DIAGNOSIS — I10 Essential (primary) hypertension: Secondary | ICD-10-CM

## 2022-01-09 ENCOUNTER — Encounter: Payer: Self-pay | Admitting: Adult Health

## 2022-01-09 ENCOUNTER — Other Ambulatory Visit: Payer: Self-pay | Admitting: Adult Health

## 2022-01-09 DIAGNOSIS — E785 Hyperlipidemia, unspecified: Secondary | ICD-10-CM

## 2022-01-09 DIAGNOSIS — R600 Localized edema: Secondary | ICD-10-CM

## 2022-01-09 DIAGNOSIS — I10 Essential (primary) hypertension: Secondary | ICD-10-CM

## 2022-01-09 MED ORDER — METOPROLOL TARTRATE 100 MG PO TABS: 100.0000 mg | ORAL_TABLET | Freq: Two times a day (BID) | ORAL | 3 refills | Status: DC

## 2022-01-09 MED ORDER — FENOFIBRATE 145 MG PO TABS: 145.0000 mg | ORAL_TABLET | Freq: Every day | ORAL | 0 refills | Status: DC

## 2022-01-09 MED ORDER — AMLODIPINE BESYLATE 10 MG PO TABS: 10.0000 mg | ORAL_TABLET | Freq: Every day | ORAL | 0 refills | Status: DC

## 2022-01-09 MED ORDER — VALSARTAN-HYDROCHLOROTHIAZIDE 320-25 MG PO TABS: 1.0000 | ORAL_TABLET | Freq: Every day | ORAL | 3 refills | Status: DC

## 2022-01-09 MED ORDER — POTASSIUM CHLORIDE CRYS ER 10 MEQ PO TBCR: 10.0000 meq | EXTENDED_RELEASE_TABLET | Freq: Every day | ORAL | 3 refills | Status: DC

## 2022-01-09 MED ORDER — PRAVASTATIN SODIUM 40 MG PO TABS: 40.0000 mg | ORAL_TABLET | Freq: Every day | ORAL | 3 refills | Status: DC

## 2022-01-09 MED ORDER — FUROSEMIDE 20 MG PO TABS: 20.0000 mg | ORAL_TABLET | Freq: Every day | ORAL | 3 refills | Status: DC

## 2022-02-06 DIAGNOSIS — M4726 Other spondylosis with radiculopathy, lumbar region: Secondary | ICD-10-CM | POA: Diagnosis not present

## 2022-02-06 DIAGNOSIS — M48061 Spinal stenosis, lumbar region without neurogenic claudication: Secondary | ICD-10-CM | POA: Diagnosis not present

## 2022-02-06 DIAGNOSIS — M5136 Other intervertebral disc degeneration, lumbar region: Secondary | ICD-10-CM | POA: Diagnosis not present

## 2022-02-15 ENCOUNTER — Encounter: Payer: Self-pay | Admitting: Adult Health

## 2022-02-15 ENCOUNTER — Ambulatory Visit (INDEPENDENT_AMBULATORY_CARE_PROVIDER_SITE_OTHER): Payer: Medicare Other | Admitting: Adult Health

## 2022-02-15 ENCOUNTER — Ambulatory Visit (INDEPENDENT_AMBULATORY_CARE_PROVIDER_SITE_OTHER): Payer: Medicare Other

## 2022-02-15 VITALS — BP 140/100 | HR 82 | Temp 97.9°F | Ht 72.0 in | Wt 325.0 lb

## 2022-02-15 DIAGNOSIS — R2242 Localized swelling, mass and lump, left lower limb: Secondary | ICD-10-CM

## 2022-02-15 DIAGNOSIS — M19072 Primary osteoarthritis, left ankle and foot: Secondary | ICD-10-CM | POA: Diagnosis not present

## 2022-02-15 DIAGNOSIS — E1169 Type 2 diabetes mellitus with other specified complication: Secondary | ICD-10-CM

## 2022-02-15 DIAGNOSIS — M7732 Calcaneal spur, left foot: Secondary | ICD-10-CM | POA: Diagnosis not present

## 2022-02-15 DIAGNOSIS — M79672 Pain in left foot: Secondary | ICD-10-CM

## 2022-02-15 LAB — MICROALBUMIN / CREATININE URINE RATIO
Creatinine,U: 114.8 mg/dL
Microalb Creat Ratio: 3.2 mg/g (ref 0.0–30.0)
Microalb, Ur: 3.7 mg/dL — ABNORMAL HIGH (ref 0.0–1.9)

## 2022-02-15 LAB — HEMOGLOBIN A1C: Hgb A1c MFr Bld: 6.6 % — ABNORMAL HIGH (ref 4.6–6.5)

## 2022-02-15 NOTE — Progress Notes (Signed)
Subjective:    Patient ID: Karlis Cregg, male    DOB: 22-Apr-1955, 66 y.o.   MRN: [id number]  HPI  66 year old male who  has a past medical history of Allergy, ASTHMA (09/07/2009), Herniated disc, HYPERLIPIDEMIA (02/07/2007), HYPERTENSION (11/29/2005), OBESITY (10/01/2007), Sleep apnea, and SLEEP APNEA, OBSTRUCTIVE (10/01/2007).  He presents to the office today for multiple issues  His first issues is that of a "knot" on the top of his left foot.  Reports that this has been present for the last few months but seems to have gotten larger over the last few weeks.  Yesterday this knot was about 3 times the size what it is today.  He does not have any pain associated with this, walking does not aggravate it.  He has full range of motion.  Has not noticed any redness or warmth  Secondly he is here for diabetes follow-up.  Currently managed with Trulicity 1.5 mg weekly.  He has not been up for a diabetes follow-up in roughly 9 months.  Lab Results  Component Value Date   HGBA1C 6.2 (A) 04/06/2021      Review of Systems See HPI   Past Medical History:  Diagnosis Date   Allergy    ASTHMA 09/07/2009   Herniated disc    HYPERLIPIDEMIA 02/07/2007   HYPERTENSION 11/29/2005   OBESITY 10/01/2007   Sleep apnea    cpap   SLEEP APNEA, OBSTRUCTIVE 10/01/2007    Social History   Socioeconomic History   Marital status: Married    Spouse name: Not on file   Number of children: 3   Years of education: Not on file   Highest education level: Not on file  Occupational History   Occupation: retired truck driver,long term disability    Employer: RETIRED  Tobacco Use   Smoking status: Never   Smokeless tobacco: Never  Vaping Use   Vaping Use: Never used  Substance and Sexual Activity   Alcohol use: Yes    Alcohol/week: 0.0 standard drinks of alcohol    Comment: rare alcohol intake   Drug use: No   Sexual activity: Not on file  Other Topics Concern   Not on file  Social History Narrative    Not on file   Social Determinants of Health   Financial Resource Strain: Unknown (04/02/2021)   Overall Financial Resource Strain (CARDIA)    Difficulty of Paying Living Expenses: Patient refused  Food Insecurity: Unknown (04/02/2021)   Hunger Vital Sign    Worried About Running Out of Food in the Last Year: Patient refused    [place] in the Last Year: Patient refused  Transportation Needs: Unknown (04/02/2021)   PRAPARE - Transportation    Lack of Transportation (Medical): Patient refused    Lack of Transportation (Non-Medical): Patient refused  Physical Activity: Unknown (04/02/2021)   Exercise Vital Sign    Days of Exercise per Week: Patient refused    Minutes of Exercise per Session: 0 min  Recent Concern: Physical Activity - Inactive (03/21/2021)   Exercise Vital Sign    Days of Exercise per Week: 0 days    Minutes of Exercise per Session: 0 min  Stress: No Stress Concern Present (04/02/2021)   [place] of Stress : Not at all  Social Connections: Unknown (04/02/2021)   Social Connection and Isolation Panel [NHANES]    Frequency of Communication with Friends and Family: Patient refused  Frequency of Social Gatherings with Friends and Family: Patient refused    Attends Religious Services: Patient refused    Active Member of Clubs or Organizations: Patient refused    Attends Archivist Meetings: Not on file    Marital Status: Patient refused  Intimate Partner Violence: Not At Risk (03/23/2020)   Humiliation, Afraid, Rape, and Kick questionnaire    Fear of Current or Ex-Partner: No    Emotionally Abused: No    Physically Abused: No    Sexually Abused: No    Past Surgical History:  Procedure Laterality Date   bicep surgery Right 2014   COLONOSCOPY     ELBOW SURGERY Right 2013   POLYPECTOMY     removal disc neck  2015   SHOULDER SURGERY Right 1990    Family History  Problem  Relation Age of Onset   Colon cancer Father 17   Lung cancer Father    CAD Father    Diabetes Father    Heart attack Father    Colon cancer Brother 27   Heart attack Paternal Grandfather    CAD Brother    Esophageal cancer Neg Hx    Stomach cancer Neg Hx    Rectal cancer Neg Hx     Allergies  Allergen Reactions   Aspirin     REACTION: unspecified   Glycerin Nausea Only   Penicillins     REACTION: family hx   Metformin And Related Diarrhea    Current Outpatient Medications on File Prior to Visit  Medication Sig Dispense Refill   albuterol (VENTOLIN HFA) 108 (90 Base) MCG/ACT inhaler Inhale 2 puffs into the lungs every 4 (four) hours as needed. 6.7 g 3   amLODipine (NORVASC) 10 MG tablet Take 1 tablet (10 mg total) by mouth daily. 90 tablet 0   Ascorbic Acid (VITAMIN C) 100 MG tablet Take 100 mg by mouth daily.     azelastine (ASTELIN) 0.1 % nasal spray Place into the nose.     cetirizine (ZYRTEC) 10 MG tablet Take by mouth.     Dulaglutide (TRULICITY) 1.5 DV/7.6HY SOPN Inject 1.5 mg into the skin once a week.     fenofibrate (TRICOR) 145 MG tablet Take 1 tablet (145 mg total) by mouth daily. 90 tablet 0   fluticasone (FLONASE) 50 MCG/ACT nasal spray Place into the nose.     furosemide (LASIX) 20 MG tablet Take 1 tablet (20 mg total) by mouth daily. 90 tablet 3   meloxicam (MOBIC) 15 MG tablet Take 1 tablet by mouth daily.     metoprolol tartrate (LOPRESSOR) 100 MG tablet Take 1 tablet (100 mg total) by mouth 2 (two) times daily. 180 tablet 3   Multiple Vitamin (MULTIVITAMIN) capsule Take 1 capsule by mouth daily.     potassium chloride (KLOR-CON M) 10 MEQ tablet Take 1 tablet (10 mEq total) by mouth daily. 90 tablet 3   pravastatin (PRAVACHOL) 40 MG tablet Take 1 tablet (40 mg total) by mouth daily. 90 tablet 3   valsartan-hydrochlorothiazide (DIOVAN-HCT) 320-25 MG tablet Take 1 tablet by mouth daily. 90 tablet 3   No current facility-administered medications on file prior to  visit.    BP (!) 140/100   Pulse 82   Temp 97.9 F (36.6 C) (Oral)   Ht 6' (1.829 m)   Wt (!) 325 lb (147.4 kg)   SpO2 99%   BMI 44.08 kg/m      Objective:   Physical Exam Vitals and nursing note reviewed.  Constitutional:      Appearance: Normal appearance.  Musculoskeletal:        General: Normal range of motion.       Legs:     Comments: Have a quarter sized raised, slightly soft mass noted on the top of his left foot.  No pain with palpation.  Skin:    General: Skin is warm and dry.     Capillary Refill: Capillary refill takes less than 2 seconds.  Neurological:     General: No focal deficit present.     Mental Status: He is alert and oriented to person, place, and time.       Assessment & Plan:  1. Mass of left foot  - DG Foot Complete Left; Future - Consider further imaging  2. Type 2 diabetes mellitus with other specified complication, unspecified whether long term insulin use (HCC)  - Hemoglobin A1c; Future - Microalbumin/Creatinine Ratio, Urine; Future   Dorothyann Peng, NP

## 2022-02-20 ENCOUNTER — Other Ambulatory Visit: Payer: Self-pay | Admitting: Adult Health

## 2022-02-23 ENCOUNTER — Other Ambulatory Visit: Payer: Self-pay | Admitting: Adult Health

## 2022-02-23 DIAGNOSIS — R2242 Localized swelling, mass and lump, left lower limb: Secondary | ICD-10-CM

## 2022-03-04 ENCOUNTER — Ambulatory Visit (HOSPITAL_BASED_OUTPATIENT_CLINIC_OR_DEPARTMENT_OTHER): Payer: Medicare Other

## 2022-03-15 ENCOUNTER — Encounter: Payer: Self-pay | Admitting: Adult Health

## 2022-03-15 ENCOUNTER — Ambulatory Visit (INDEPENDENT_AMBULATORY_CARE_PROVIDER_SITE_OTHER): Payer: Medicare Other | Admitting: Adult Health

## 2022-03-15 VITALS — BP 140/90 | HR 65 | Temp 98.1°F | Ht 72.0 in | Wt 322.0 lb

## 2022-03-15 DIAGNOSIS — M25551 Pain in right hip: Secondary | ICD-10-CM | POA: Diagnosis not present

## 2022-03-15 MED ORDER — HYDROCODONE-ACETAMINOPHEN 7.5-325 MG PO TABS: 1.0000 | ORAL_TABLET | Freq: Four times a day (QID) | ORAL | 0 refills | Status: AC | PRN

## 2022-03-15 NOTE — Progress Notes (Signed)
Subjective:    Patient ID: Tommy Jackson, male    DOB: [birth year], 67 y.o.   MRN: [id number]  HPI 67 year old male who  has a past medical history of Allergy, ASTHMA (09/07/2009), Herniated disc, HYPERLIPIDEMIA (02/07/2007), HYPERTENSION (11/29/2005), OBESITY (10/01/2007), Sleep apnea, and SLEEP APNEA, OBSTRUCTIVE (10/01/2007).  He presents to the office today for an acute on chronic issue of right hip pain. Pain started about a week ago and has been constant since. Pain is worse when changing positions and walking. He has no pain when sitting. Pain is described as a " sharp". He denies trauma or aggravating injury. Pain radiates to groin. He feels unsteady with walking and is having to use a cane. Pain not relieved with OTC medication   He has had xrays done of both hips in 05/2020   AP pelvis lateral view of the right hip: Both hips are well located.   Slight narrowing of the right hip joint.  Otherwise no acute fractures no  bony abnormalities.    Review of Systems See HPI   Past Medical History:  Diagnosis Date   Allergy    ASTHMA 09/07/2009   Herniated disc    HYPERLIPIDEMIA 02/07/2007   HYPERTENSION 11/29/2005   OBESITY 10/01/2007   Sleep apnea    cpap   SLEEP APNEA, OBSTRUCTIVE 10/01/2007    Social History   Socioeconomic History   Marital status: Married    Spouse name: Not on file   Number of children: 3   Years of education: Not on file   Highest education level: Not on file  Occupational History   Occupation: retired truck driver,long term disability    Employer: RETIRED  Tobacco Use   Smoking status: Never   Smokeless tobacco: Never  Vaping Use   Vaping Use: Never used  Substance and Sexual Activity   Alcohol use: Yes    Alcohol/week: 0.0 standard drinks of alcohol    Comment: rare alcohol intake   Drug use: No   Sexual activity: Not on file  Other Topics Concern   Not on file  Social History Narrative   Not on file   Social Determinants of Health    Financial Resource Strain: Unknown (04/02/2021)   Overall Financial Resource Strain (CARDIA)    Difficulty of Paying Living Expenses: Patient refused  Food Insecurity: Unknown (04/02/2021)   Hunger Vital Sign    Worried About Running Out of Food in the Last Year: Patient refused    [place] in the Last Year: Patient refused  Transportation Needs: Unknown (04/02/2021)   PRAPARE - Transportation    Lack of Transportation (Medical): Patient refused    Lack of Transportation (Non-Medical): Patient refused  Physical Activity: Unknown (04/02/2021)   Exercise Vital Sign    Days of Exercise per Week: Patient refused    Minutes of Exercise per Session: 0 min  Recent Concern: Physical Activity - Inactive (03/21/2021)   Exercise Vital Sign    Days of Exercise per Week: 0 days    Minutes of Exercise per Session: 0 min  Stress: No Stress Concern Present (04/02/2021)   [place] of Stress : Not at all  Social Connections: Unknown (04/02/2021)   Social Connection and Isolation Panel [NHANES]    Frequency of Communication with Friends and Family: Patient refused    Frequency of Social Gatherings with Friends and Family: Patient refused    Attends Religious Services:  Patient refused    Active Member of Clubs or Organizations: Patient refused    Attends Archivist Meetings: Not on file    Marital Status: Patient refused  Intimate Partner Violence: Not At Risk (03/23/2020)   Humiliation, Afraid, Rape, and Kick questionnaire    Fear of Current or Ex-Partner: No    Emotionally Abused: No    Physically Abused: No    Sexually Abused: No    Past Surgical History:  Procedure Laterality Date   bicep surgery Right 2014   COLONOSCOPY     ELBOW SURGERY Right 2013   POLYPECTOMY     removal disc neck  2015   SHOULDER SURGERY Right 1990    Family History  Problem Relation Age of Onset   Colon cancer Father 64    Lung cancer Father    CAD Father    Diabetes Father    Heart attack Father    Colon cancer Brother 26   Heart attack Paternal Grandfather    CAD Brother    Esophageal cancer Neg Hx    Stomach cancer Neg Hx    Rectal cancer Neg Hx     Allergies  Allergen Reactions   Aspirin     REACTION: unspecified   Glycerin Nausea Only   Penicillins     REACTION: family hx   Metformin And Related Diarrhea    Current Outpatient Medications on File Prior to Visit  Medication Sig Dispense Refill   albuterol (VENTOLIN HFA) 108 (90 Base) MCG/ACT inhaler Inhale 2 puffs into the lungs every 4 (four) hours as needed. 6.7 g 3   amLODipine (NORVASC) 10 MG tablet Take 1 tablet (10 mg total) by mouth daily. 90 tablet 0   Ascorbic Acid (VITAMIN C) 100 MG tablet Take 100 mg by mouth daily.     azelastine (ASTELIN) 0.1 % nasal spray Place into the nose.     cetirizine (ZYRTEC) 10 MG tablet Take by mouth.     fenofibrate (TRICOR) 145 MG tablet Take 1 tablet (145 mg total) by mouth daily. 90 tablet 0   fluticasone (FLONASE) 50 MCG/ACT nasal spray Place into the nose.     furosemide (LASIX) 20 MG tablet Take 1 tablet (20 mg total) by mouth daily. 90 tablet 3   meloxicam (MOBIC) 15 MG tablet Take 1 tablet by mouth daily.     metoprolol tartrate (LOPRESSOR) 100 MG tablet Take 1 tablet (100 mg total) by mouth 2 (two) times daily. 180 tablet 3   Multiple Vitamin (MULTIVITAMIN) capsule Take 1 capsule by mouth daily.     potassium chloride (KLOR-CON M) 10 MEQ tablet Take 1 tablet (10 mEq total) by mouth daily. 90 tablet 3   pravastatin (PRAVACHOL) 40 MG tablet Take 1 tablet (40 mg total) by mouth daily. 90 tablet 3   TRULICITY 1.5 YB/0.1BP SOPN INJECT 1.5 MG (0.5ML) UNDER THE SKIN ONCE A WEEK 8 mL 0   valsartan-hydrochlorothiazide (DIOVAN-HCT) 320-25 MG tablet Take 1 tablet by mouth daily. 90 tablet 3   No current facility-administered medications on file prior to visit.    There were no vitals taken for this  visit.      Objective:   Physical Exam Vitals and nursing note reviewed.  Constitutional:      Appearance: Normal appearance. He is obese.  Cardiovascular:     Rate and Rhythm: Normal rate and regular rhythm.     Pulses: Normal pulses.     Heart sounds: Normal heart sounds.  Pulmonary:  Effort: Pulmonary effort is normal.     Breath sounds: Normal breath sounds.  Musculoskeletal:        General: No tenderness.     Right hip: No deformity, tenderness or bony tenderness. Decreased range of motion. Decreased strength.     Comments: Has right sided hip and groin pain with internal and external movements of the leg as well as knee to chest and internal and external rotation of the right knee.   Neurological:     General: No focal deficit present.     Mental Status: He is alert and oriented to person, place, and time.  Psychiatric:        Mood and Affect: Mood normal.        Behavior: Behavior normal.        Thought Content: Thought content normal.        Judgment: Judgment normal.       Assessment & Plan:  1. Right hip pain - likely labral tear. Will order MRI of hip and likely need to be referred to orthopedics  - Will prescribed Norco for pain relief. - MR HIP RIGHT WO CONTRAST; Future - HYDROcodone-acetaminophen (NORCO) 7.5-325 MG tablet; Take 1 tablet by mouth every 6 (six) hours as needed for up to 5 days for moderate pain.  Dispense: 20 tablet; Refill: 0  Dorothyann Peng, NP

## 2022-03-16 DIAGNOSIS — M4316 Spondylolisthesis, lumbar region: Secondary | ICD-10-CM | POA: Diagnosis not present

## 2022-03-16 DIAGNOSIS — M48062 Spinal stenosis, lumbar region with neurogenic claudication: Secondary | ICD-10-CM | POA: Diagnosis not present

## 2022-03-16 DIAGNOSIS — G894 Chronic pain syndrome: Secondary | ICD-10-CM | POA: Diagnosis not present

## 2022-03-16 DIAGNOSIS — M47816 Spondylosis without myelopathy or radiculopathy, lumbar region: Secondary | ICD-10-CM | POA: Diagnosis not present

## 2022-03-16 DIAGNOSIS — M48061 Spinal stenosis, lumbar region without neurogenic claudication: Secondary | ICD-10-CM | POA: Diagnosis not present

## 2022-03-16 DIAGNOSIS — M2578 Osteophyte, vertebrae: Secondary | ICD-10-CM | POA: Diagnosis not present

## 2022-03-16 DIAGNOSIS — M5136 Other intervertebral disc degeneration, lumbar region: Secondary | ICD-10-CM | POA: Diagnosis not present

## 2022-03-18 ENCOUNTER — Ambulatory Visit (HOSPITAL_BASED_OUTPATIENT_CLINIC_OR_DEPARTMENT_OTHER)
Admission: RE | Admit: 2022-03-18 | Discharge: 2022-03-18 | Disposition: A | Discharge status: Home / Self Care | Payer: Medicare Other | Source: Ambulatory Visit | Attending: Adult Health | Admitting: Adult Health

## 2022-03-18 DIAGNOSIS — R2242 Localized swelling, mass and lump, left lower limb: Secondary | ICD-10-CM | POA: Diagnosis not present

## 2022-03-18 DIAGNOSIS — M67472 Ganglion, left ankle and foot: Secondary | ICD-10-CM | POA: Diagnosis not present

## 2022-03-18 DIAGNOSIS — M19072 Primary osteoarthritis, left ankle and foot: Secondary | ICD-10-CM | POA: Diagnosis not present

## 2022-03-22 ENCOUNTER — Telehealth (HOSPITAL_BASED_OUTPATIENT_CLINIC_OR_DEPARTMENT_OTHER): Payer: Self-pay

## 2022-03-23 ENCOUNTER — Encounter: Payer: Self-pay | Admitting: Adult Health

## 2022-03-23 NOTE — Telephone Encounter (Signed)
Spoke to pt and advised that the referral was sent to [facility]. Pt verbalized understanding and will reach out to them.

## 2022-03-27 ENCOUNTER — Ambulatory Visit: Payer: Medicare HMO

## 2022-03-30 ENCOUNTER — Encounter: Payer: Self-pay | Admitting: Family Medicine

## 2022-03-30 ENCOUNTER — Telehealth: Payer: Medicare Other | Admitting: Family Medicine

## 2022-03-30 VITALS — BP 141/91 | HR 77 | Ht 72.0 in | Wt 322.0 lb

## 2022-03-30 DIAGNOSIS — Z0000 Encounter for general adult medical examination without abnormal findings: Secondary | ICD-10-CM

## 2022-03-30 NOTE — Progress Notes (Signed)
PATIENT CHECK-IN and HEALTH RISK ASSESSMENT QUESTIONNAIRE:  -completed by phone/video for upcoming Medicare Preventive Visit  Pre-Visit Check-in: 1)Vitals (height, wt, BP, etc) - record in vitals section for visit on day of visit 2)Review and Update Medications, Allergies PMH, Surgeries, Social history in Epic 3)Hospitalizations in the last year with date/reason? No  4)Review and Update Care Team (patient's specialists) in Epic 5) Complete PHQ9 in Epic  6) Complete Fall Screening in Epic 7)Review all Health Maintenance Due and order under PCP if not done.  Medicare Wellness Patient Questionnaire:  Answer theses question about your habits: Do you drink alcohol? Rarely, does not drink much Have you ever smoked? Yes  Quit date if applicable? 1975  How many packs a day do/did you smoke? Less than 1  Do you use smokeless tobacco?No Do you use an illicit drugs?No Do you exercises? No, walks with a cane with the hip bothering him - has MRI coming up, excruciated pain when turns a certain way so taking it easy right now Are you sexually active? No Number of partners?N/A Eats out a lot, does some cooking at home too,  Typical breakfast: Chicken and eggs  Typical lunch: Varies Typical dinner: Varies  Typical snacks: Chips, Cashews, Ice cream   Beverages: Diet pepsi, lemonade, Tea   Answer theses question about you: Can you perform most household chores?Yes Do you find it hard to follow a conversation in a noisy room?No Do you often ask people to speak up or repeat themselves?No Do you feel that you have a problem with memory?No Do you balance your checkbook and or bank acounts?No Do you feel safe at home? Yes Last dentist visit? Goes yearly Do you need assistance with any of the following:  Driving? No  Feeding yourself? No  Getting from bed to chair? No  Getting to the toilet? No  Bathing or showering? No  Dressing yourself? No  Managing money? No  Climbing a flight of stairs  No  Preparing meals? No    Do you have Advanced Directives in place (Living Will, Healthcare Power or Attorney)? Yes   Last eye Exam and location? 1 year ago/ [facility]    Do you currently use prescribed or non-prescribed narcotic or opioid pain medications? Yes   Do you have a history or close family history of breast, ovarian, tubal or peritoneal cancer or a family member with BRCA (breast cancer susceptibility 1 and 2) gene mutations? No  Nurse/Assistant Credentials/time stamp: MG 4:03 PM   ----------------------------------------------------------------------------------------------------------------------------------------------------------------------------------------------------------------------    MEDICARE ANNUAL PREVENTIVE CARE VISIT WITH PROVIDER (Welcome to Medicare, initial annual wellness or annual wellness exam)  Virtual Visit via Phone Note  I connected with Tommy Jackson  on 03/30/2022 verified that I am speaking with the correct person using two identifiers.  Location patient: home Location provider:work or home office Persons participating in the virtual visit: patient, provider  Concerns and/or follow up today: Seeing Tommi Rumps about hip pain - has MRI this weekend. The pain has caused him to have elevated BP. He is on nsaids, but reports has been on them forever with chronic back pain. The BP goes up when in pain. Denies CP, SOB, swelling, palpitations, HA.    See HM section in Epic for other details of completed HM.    ROS: negative for report of fevers, unintentional weight loss, vision changes, vision loss, hearing loss or change, chest pain, sob, hemoptysis, melena, hematochezia, hematuria, genital discharge or lesions, falls, bleeding or bruising, loc, thoughts of suicide or self  harm, memory loss  Patient-completed extensive health risk assessment - reviewed and discussed with the patient: See Health Risk Assessment completed with patient prior to the visit  either above or in recent phone note. This was reviewed in detailed with the patient today and appropriate recommendations, orders and referrals were placed as needed per Summary below and patient instructions.   Review of Medical History: -PMH, [place], Family History and current specialty and care providers reviewed and updated and listed below   Patient Care Team: Dorothyann Peng, NP as PCP - General (Family Medicine) Myrle Sheng, MD as Referring Physician (Neurosurgery) Milas Kocher, NP as Nurse Practitioner (Nurse Practitioner)   Past Medical History:  Diagnosis Date   Allergy    ASTHMA 09/07/2009   Herniated disc    HYPERLIPIDEMIA 02/07/2007   HYPERTENSION 11/29/2005   OBESITY 10/01/2007   Sleep apnea    cpap   SLEEP APNEA, OBSTRUCTIVE 10/01/2007    Past Surgical History:  Procedure Laterality Date   bicep surgery Right 2014   COLONOSCOPY     ELBOW SURGERY Right 2013   POLYPECTOMY     removal disc neck  2015   SHOULDER SURGERY Right 1990    Social History   Socioeconomic History   Marital status: Married    Spouse name: Not on file   Number of children: 3   Years of education: Not on file   Highest education level: Not on file  Occupational History   Occupation: retired truck driver,long term disability    Employer: RETIRED  Tobacco Use   Smoking status: Never   Smokeless tobacco: Never  Vaping Use   Vaping Use: Never used  Substance and Sexual Activity   Alcohol use: Yes    Alcohol/week: 0.0 standard drinks of alcohol    Comment: rare alcohol intake   Drug use: No   Sexual activity: Not on file  Other Topics Concern   Not on file  Social History Narrative   Not on file   Social Determinants of Health   Financial Resource Strain: Unknown (04/02/2021)   Overall Financial Resource Strain (CARDIA)    Difficulty of Paying Living Expenses: Patient refused  Food Insecurity: Unknown (04/02/2021)   Hunger Vital Sign    Worried About Running Out of  Food in the Last Year: Patient refused    [place] in the Last Year: Patient refused  Transportation Needs: Unknown (04/02/2021)   PRAPARE - Transportation    Lack of Transportation (Medical): Patient refused    Lack of Transportation (Non-Medical): Patient refused  Physical Activity: Unknown (04/02/2021)   Exercise Vital Sign    Days of Exercise per Week: Patient refused    Minutes of Exercise per Session: Not on file  Recent Concern: Physical Activity - Inactive (03/21/2021)   Exercise Vital Sign    Days of Exercise per Week: 0 days    Minutes of Exercise per Session: 0 min  Stress: No Stress Concern Present (04/02/2021)   [place] of Stress : Not at all  Social Connections: Unknown (04/02/2021)   Social Connection and Isolation Panel [NHANES]    Frequency of Communication with Friends and Family: Patient refused    Frequency of Social Gatherings with Friends and Family: Patient refused    Attends Religious Services: Patient refused    Active Member of Clubs or Organizations: Patient refused    Attends Archivist Meetings: Not on file  Marital Status: Patient refused  Intimate Partner Violence: Not At Risk (03/23/2020)   Humiliation, Afraid, Rape, and Kick questionnaire    Fear of Current or Ex-Partner: No    Emotionally Abused: No    Physically Abused: No    Sexually Abused: No    Family History  Problem Relation Age of Onset   Colon cancer Father 55   Lung cancer Father    CAD Father    Diabetes Father    Heart attack Father    Colon cancer Brother 67   Heart attack Paternal Grandfather    CAD Brother    Esophageal cancer Neg Hx    Stomach cancer Neg Hx    Rectal cancer Neg Hx     Current Outpatient Medications on File Prior to Visit  Medication Sig Dispense Refill   albuterol (VENTOLIN HFA) 108 (90 Base) MCG/ACT inhaler Inhale 2 puffs into the lungs every 4 (four) hours as  needed. 6.7 g 3   amLODipine (NORVASC) 10 MG tablet Take 1 tablet (10 mg total) by mouth daily. 90 tablet 0   Ascorbic Acid (VITAMIN C) 100 MG tablet Take 500 mg by mouth daily.     azelastine (ASTELIN) 0.1 % nasal spray Place into the nose.     cetirizine (ZYRTEC) 10 MG tablet Take by mouth.     fenofibrate (TRICOR) 145 MG tablet Take 1 tablet (145 mg total) by mouth daily. 90 tablet 0   fluticasone (FLONASE) 50 MCG/ACT nasal spray Place into the nose.     furosemide (LASIX) 20 MG tablet Take 1 tablet (20 mg total) by mouth daily. 90 tablet 3   meloxicam (MOBIC) 15 MG tablet Take 1 tablet by mouth daily.     metoprolol tartrate (LOPRESSOR) 100 MG tablet Take 1 tablet (100 mg total) by mouth 2 (two) times daily. 180 tablet 3   Multiple Vitamin (MULTIVITAMIN) capsule Take 1 capsule by mouth daily.     potassium chloride (KLOR-CON M) 10 MEQ tablet Take 1 tablet (10 mEq total) by mouth daily. 90 tablet 3   pravastatin (PRAVACHOL) 40 MG tablet Take 1 tablet (40 mg total) by mouth daily. 90 tablet 3   TRULICITY 1.5 YK/5.9DJ SOPN INJECT 1.5 MG (0.5ML) UNDER THE SKIN ONCE A WEEK 8 mL 0   valsartan-hydrochlorothiazide (DIOVAN-HCT) 320-25 MG tablet Take 1 tablet by mouth daily. 90 tablet 3   No current facility-administered medications on file prior to visit.    Allergies  Allergen Reactions   Aspirin     REACTION: unspecified   Glycerin Nausea Only   Penicillins     REACTION: family hx   Metformin And Related Diarrhea       Physical Exam Vitals:   03/30/22 1544  BP: (!) 141/91  Pulse: 77   Estimated body mass index is 43.67 kg/m as calculated from the following:   Height as of this encounter: 6' (1.829 m).   Weight as of this encounter: 322 lb (146.1 kg).  EKG (optional): deferred due to virtual visit  GENERAL: alert, oriented, no acute distress detected; full vision exam deferred due to pandemic and/or virtual encounter  PSYCH/NEURO: pleasant and cooperative, no obvious  depression or anxiety, speech and thought processing grossly intact, Cognitive function grossly intact  Flowsheet Row Video Visit from 03/30/2022 in [place] at [facility]  PHQ-9 Total Score 0           03/30/2022    3:54 PM 03/15/2022    1:20 PM 03/21/2021  10:35 AM 03/23/2020   11:44 AM 03/25/2019   10:26 AM  Depression screen PHQ 2/9  Decreased Interest 0 0 0 0 0  Down, Depressed, Hopeless 0 0 0 0 0  PHQ - 2 Score 0 0 0 0 0  Altered sleeping 0 0  0   Tired, decreased energy 0 0  0   Change in appetite 0 0  0   Feeling bad or failure about yourself  0 0  0   Trouble concentrating 0 0  0   Moving slowly or fidgety/restless 0 0  0   Suicidal thoughts 0 0  0   PHQ-9 Score 0 0  0   Difficult doing work/chores Not difficult at all Not difficult at all  Not difficult at all        03/23/2020   11:43 AM 03/17/2021    9:24 AM 03/21/2021   10:35 AM 02/13/2022    3:57 PM 03/30/2022    3:56 PM  Fall Risk  Falls in the past year? 0 0 0 0 0  Was there an injury with Fall? 0 0   0  Fall Risk Category Calculator 0 0   0  Fall Risk Category (Retired) Low Low     (RETIRED) Patient Fall Risk Level Low fall risk      Patient at Risk for Falls Due to Orthopedic patient  Medication side effect  No Fall Risks  Fall risk Follow up Falls evaluation completed;Falls prevention discussed  Falls evaluation completed;Education provided;Falls prevention discussed  Falls evaluation completed     SUMMARY AND PLAN:  Encounter for Medicare annual wellness exam   Discussed applicable health maintenance/preventive health measures and advised and referred or ordered per patient preferences:  Health Maintenance  Topic Date Due   DTaP/Tdap/Td (2 - Tdap) 06/09/2019, considering, plans to do with PCP if decides to do   Pneumonia Vaccine 47+ Years old (2 - PCV) 07/15/2021, considering   Diabetic kidney evaluation - eGFR measurement  09/16/2021, does with PCP   INFLUENZA VACCINE   05/28/2022 (Originally 09/27/2021) pt declined   COVID-19 Vaccine (1) 04/16/2027 (Originally 01/15/1956)pt declined   Zoster Vaccines- Shingrix (1 of 2) 06/28/2027 (Originally 07/16/2006) pt declined   Diabetic kidney evaluation - Urine ACR  02/16/2023   Medicare Annual Wellness (AWV)  03/31/2023   COLONOSCOPY (Pts 45-20yr Insurance coverage will need to be confirmed)  08/29/2023   Hepatitis C Screening  Completed   HIV Screening  Completed   HPV VACCINES  Aged Out   Patient does prostate ca screening with PCP, plans to do with physical.   Education and counseling on the following was provided based on the above review of health and a plan/checklist for the patient, along with additional information discussed, was provided for the patient in the patient instructions :  -Advised and counseled on maintaining healthy weight and healthy lifestyle - including the importance of a health diet, regular physical activity - info on social connections and stress management provided in pt instructions -Advised and counseled on a whole foods based healthy diet and regular exercise: discussed a heart healthy whole foods based diet at length. A summary of a healthy diet was provided in the Patient Instructions. Also advised limiting sodium - discussed ways to lower sodium while eating out. -For now advised not starting exercise until hip pain has been evaluated and is cleared with his provider. Guidelines provided to consider once over acute issues.  -Advised yearly dental visits at minimum and  regular eye exams -Advised and counseled on alcohol safer limites/risks.  -advised healthy diet, sodium reduction for HTN and advised monitoring BP, improved on recheck, advised to alert PCP if BP running high over the next few days  Follow up: see patient instructions   Patient Instructions  I really enjoyed getting to talk with you today! I am available on Tuesdays and Thursdays for virtual visits if you have any  questions or concerns, or if I can be of any further assistance.   CHECKLIST FROM ANNUAL WELLNESS VISIT:  -Follow up (please call to schedule if not scheduled after visit):  -Inperson visit with your Primary Doctor office: please schedule in office physical in a few months -yearly for annual wellness visit with primary care office  Here is a list of your preventive care/health maintenance measures and the plan for each if any are due:  Health Maintenance  Topic Date Due   DTaP/Tdap/Td (2 - Tdap) 06/09/2019   Pneumonia Vaccine 7+ Years old (2 - PCV) 07/15/2021   Diabetic kidney evaluation - eGFR measurement  09/16/2021   INFLUENZA VACCINE  05/28/2022 (Originally 09/27/2021)   COVID-19 Vaccine (1) 04/16/2027 (Originally 01/15/1956)   Zoster Vaccines- Shingrix (1 of 2) 06/28/2027 (Originally 07/16/2006)   Diabetic kidney evaluation - Urine ACR  02/16/2023   Medicare Annual Wellness (AWV)  03/31/2023   COLONOSCOPY (Pts 45-76yr Insurance coverage will need to be confirmed)  08/29/2023   Hepatitis C Screening  Completed   HIV Screening  Completed   HPV VACCINES  Aged Out    -See a dentist at least yearly  -Get your eyes checked and then per your eye specialist's recommendations  -Other issues addressed today:   -I have included below further information regarding a healthy whole foods based diet, physical activity guidelines for adults, stress management and opportunities for social connections. I hope you find this information useful.     NUTRITION: -eat real food: lots of colorful vegetables (half the plate) and fruits -5-7 servings of vegetables and fruits per day (fresh or steamed is best), exp. 2 servings of vegetables with lunch and  dinner and 2 servings of fruit per day. Berries and greens such as kale and collards are great choices.  -consume on a regular basis: whole grains (make sure first ingredient on label contains the word "whole"), fresh fruits, fish, nuts, seeds, healthy oils (such as olive oil, avocado oil, grape seed oil) -may eat small amounts of dairy and lean meat on occasion, but avoid processed meats such as ham, bacon, lunch meat, etc. -drink water -try to avoid fast food and pre-packaged foods, processed meat -most experts advise limiting sodium to < '2300mg'$  per day, should limit further is any chronic conditions such as high blood pressure, heart disease, diabetes, etc. The American Heart Association advised that < '1500mg'$  is is ideal -try to avoid foods that contain any ingredients with names you do not recognize  -try to avoid sugar/sweets (except for the natural sugar that occurs in fresh fruit) -try to avoid sweet drinks -try to avoid white rice, white bread, pasta (unless whole grain), white or yellow potatoes  EXERCISE GUIDELINES FOR ADULTS: -if you wish to increase your physical activity, do so gradually and with the approval of your doctor - wait until acute hip issues are resolved and ask your doctor before starting -STOP and seek medical care immediately if you have any chest pain, chest discomfort or trouble breathing when starting or increasing exercise  -move and stretch your  body, legs, feet and arms when sitting for long periods -Physical activity guidelines for optimal health in adults: -least 150 minutes per week of aerobic exercise (can talk, but not sing) once approved by your doctor, 20-30 minutes of sustained activity or two 10 minute episodes of sustained activity every day.  -resistance training at least 2 days per week if approved by your doctor -balance exercises 3+ days per week:   Stand somewhere where you have something sturdy to hold onto if you lose balance.    1) lift up on  toes, start with 5x per day and work up to 20x   2) stand and lift on leg straight out to the side so that foot is a few inches of the floor, start with 5x each side and work up to 20x each side   3) stand on one foot, start with 5 seconds each side and work up to 20 seconds on each side  If you need ideas or help with getting more active:  -Silver sneakers https://tools.silversneakers.com  -Walk with a Doc: http://stephens-thompson.biz/  -try to include resistance (weight lifting/strength building) and balance exercises twice per week: or the following link for ideas: ChessContest.fr  UpdateClothing.com.cy  STRESS MANAGEMENT: -can try meditating, or just sitting quietly with deep breathing while intentionally relaxing all parts of your body for 5 minutes daily -if you need further help with stress, anxiety or depression please follow up with your primary doctor or contact the wonderful folks at [place]: [place]: -options in [place] if you wish to engage in more social and exercise related activities:  -Silver sneakers https://tools.silversneakers.com  -Walk with a Doc: http://stephens-thompson.biz/  -Check out the [place] 50+ section on the [place] of [employer] (hiking clubs, book clubs, cards and games, chess, exercise classes, aquatic classes and much more) - see the website for details: https://www.[place].gov/departments/parks-recreation/active-adults50  -YouTube has lots of exercise videos for different ages and abilities as well  -[place] (a variety of indoor and outdoor inperson activities for adults). 253-688-1357. [address].  -Virtual Online Classes (a variety of topics): see seniorplanet.org or call [phone]  -consider volunteering at a school, hospice center, church, senior  center or elsewhere           Lucretia Kern, DO

## 2022-03-30 NOTE — Patient Instructions (Addendum)
I really enjoyed getting to talk with you today! I am available on Tuesdays and Thursdays for virtual visits if you have any questions or concerns, or if I can be of any further assistance.   CHECKLIST FROM ANNUAL WELLNESS VISIT:  -Follow up (please call to schedule if not scheduled after visit):  -Inperson visit with your Primary Doctor office: please schedule in office physical in a few months -yearly for annual wellness visit with primary care office  Here is a list of your preventive care/health maintenance measures and the plan for each if any are due:  Health Maintenance  Topic Date Due   DTaP/Tdap/Td (2 - Tdap) 06/09/2019   Pneumonia Vaccine 71+ Years old (2 - PCV) 07/15/2021   Diabetic kidney evaluation - eGFR measurement  09/16/2021   INFLUENZA VACCINE  05/28/2022 (Originally 09/27/2021)   COVID-19 Vaccine (1) 04/16/2027 (Originally 01/15/1956)   Zoster Vaccines- Shingrix (1 of 2) 06/28/2027 (Originally 07/16/2006)   Diabetic kidney evaluation - Urine ACR  02/16/2023   Medicare Annual Wellness (AWV)  03/31/2023   COLONOSCOPY (Pts 45-82yr Insurance coverage will need to be confirmed)  08/29/2023   Hepatitis C Screening  Completed   HIV Screening  Completed   HPV VACCINES  Aged Out    -See a dentist at least yearly  -Get your eyes checked and then per your eye specialist's recommendations  -Other issues addressed today:   -I have included below further information regarding a healthy whole foods based diet, physical activity guidelines for adults, stress management and opportunities for social connections. I hope you find this information useful.     NUTRITION: -eat real food: lots of colorful vegetables (half the plate) and  fruits -5-7 servings of vegetables and fruits per day (fresh or steamed is best), exp. 2 servings of vegetables with lunch and dinner and 2 servings of fruit per day. Berries and greens such as kale and collards are great choices.  -consume on a regular basis: whole grains (make sure first ingredient on label contains the word "whole"), fresh fruits, fish, nuts, seeds, healthy oils (such as olive oil, avocado oil, grape seed oil) -may eat small amounts of dairy and lean meat on occasion, but avoid processed meats such as ham, bacon, lunch meat, etc. -drink water -try to avoid fast food and pre-packaged foods, processed meat -most experts advise limiting sodium to < '2300mg'$  per day, should limit further is any chronic conditions such as high blood pressure, heart disease, diabetes, etc. The American Heart Association advised that < '1500mg'$  is is ideal -try to avoid foods that contain any ingredients with names you do not recognize  -try to avoid sugar/sweets (except for the natural sugar that occurs in fresh fruit) -try to avoid sweet drinks -try to avoid white rice, white bread, pasta (unless whole grain), white or yellow potatoes  EXERCISE GUIDELINES FOR ADULTS: -if you wish to increase your physical activity, do so gradually and with the approval of your doctor - wait until acute hip issues are resolved and ask your doctor before starting -STOP and seek medical care immediately if you have any chest pain, chest discomfort or trouble breathing when starting or increasing exercise  -move and stretch your body, legs, feet and arms when sitting for long periods -Physical activity guidelines for optimal health in adults: -least 150 minutes per week of aerobic exercise (can talk, but not sing) once approved by your doctor, 20-30 minutes of sustained activity or two 10 minute episodes of sustained activity every  day.  -resistance training at least 2 days per week if approved by your doctor -balance  exercises 3+ days per week:   Stand somewhere where you have something sturdy to hold onto if you lose balance.    1) lift up on toes, start with 5x per day and work up to 20x   2) stand and lift on leg straight out to the side so that foot is a few inches of the floor, start with 5x each side and work up to 20x each side   3) stand on one foot, start with 5 seconds each side and work up to 20 seconds on each side  If you need ideas or help with getting more active:  -Silver sneakers https://tools.silversneakers.com  -Walk with a Doc: http://stephens-thompson.biz/  -try to include resistance (weight lifting/strength building) and balance exercises twice per week: or the following link for ideas: ChessContest.fr  UpdateClothing.com.cy  STRESS MANAGEMENT: -can try meditating, or just sitting quietly with deep breathing while intentionally relaxing all parts of your body for 5 minutes daily -if you need further help with stress, anxiety or depression please follow up with your primary doctor or contact the wonderful folks at [place]: [place]: -options in [place] if you wish to engage in more social and exercise related activities:  -Silver sneakers https://tools.silversneakers.com  -Walk with a Doc: http://stephens-thompson.biz/  -Check out the [place] 50+ section on the [place] [employer] (hiking clubs, book clubs, cards and games, chess, exercise classes, aquatic classes and much more) - see the website for details: https://www.[place].gov/departments/parks-recreation/active-adults50  -YouTube has lots of exercise videos for different ages and abilities as well  -[place] (a variety of indoor and outdoor inperson activities for adults). 803-280-5161. [address]  -Virtual Online Classes (a  variety of topics): see seniorplanet.org or call 602-454-7364  -consider volunteering at a school, hospice center, church, senior center or elsewhere

## 2022-04-08 ENCOUNTER — Ambulatory Visit (HOSPITAL_BASED_OUTPATIENT_CLINIC_OR_DEPARTMENT_OTHER)
Admission: RE | Admit: 2022-04-08 | Discharge: 2022-04-08 | Disposition: A | Discharge status: Home / Self Care | Payer: Medicare Other | Source: Ambulatory Visit | Attending: Adult Health | Admitting: Adult Health

## 2022-04-08 DIAGNOSIS — M25551 Pain in right hip: Secondary | ICD-10-CM | POA: Diagnosis not present

## 2022-04-08 DIAGNOSIS — R531 Weakness: Secondary | ICD-10-CM | POA: Diagnosis not present

## 2022-04-08 DIAGNOSIS — S76311A Strain of muscle, fascia and tendon of the posterior muscle group at thigh level, right thigh, initial encounter: Secondary | ICD-10-CM | POA: Diagnosis not present

## 2022-04-08 DIAGNOSIS — M533 Sacrococcygeal disorders, not elsewhere classified: Secondary | ICD-10-CM | POA: Diagnosis not present

## 2022-04-08 DIAGNOSIS — M1611 Unilateral primary osteoarthritis, right hip: Secondary | ICD-10-CM | POA: Diagnosis not present

## 2022-04-12 ENCOUNTER — Other Ambulatory Visit: Payer: Self-pay

## 2022-04-12 ENCOUNTER — Encounter: Payer: Self-pay | Admitting: Adult Health

## 2022-04-13 ENCOUNTER — Other Ambulatory Visit: Payer: Self-pay | Admitting: Adult Health

## 2022-04-13 DIAGNOSIS — M25551 Pain in right hip: Secondary | ICD-10-CM

## 2022-04-13 NOTE — Telephone Encounter (Signed)
Pt was notified of results

## 2022-04-21 ENCOUNTER — Telehealth: Payer: Self-pay

## 2022-04-21 NOTE — Telephone Encounter (Signed)
Called and advised pt. He stated understanding and would call his pcp

## 2022-04-21 NOTE — Telephone Encounter (Signed)
Pt called and states that he has an appt with Artis Delay on Thursday s/p MRI of the hip. Pt states that he is having terrible pain and he cannot hardly sit, walk and wants to know if something can be called in until his appt on Thursday with Artis Delay.  [facility] [place]

## 2022-04-25 ENCOUNTER — Ambulatory Visit: Payer: Medicare Other | Admitting: Adult Health

## 2022-04-27 ENCOUNTER — Encounter: Payer: Self-pay | Admitting: Physician Assistant

## 2022-04-27 ENCOUNTER — Ambulatory Visit (INDEPENDENT_AMBULATORY_CARE_PROVIDER_SITE_OTHER): Payer: Medicare Other

## 2022-04-27 ENCOUNTER — Ambulatory Visit (INDEPENDENT_AMBULATORY_CARE_PROVIDER_SITE_OTHER): Payer: Medicare Other | Admitting: Physician Assistant

## 2022-04-27 VITALS — Ht 72.0 in | Wt 318.0 lb

## 2022-04-27 DIAGNOSIS — M25551 Pain in right hip: Secondary | ICD-10-CM

## 2022-04-27 DIAGNOSIS — M1611 Unilateral primary osteoarthritis, right hip: Secondary | ICD-10-CM | POA: Diagnosis not present

## 2022-04-27 DIAGNOSIS — Z6841 Body Mass Index (BMI) 40.0 and over, adult: Secondary | ICD-10-CM | POA: Diagnosis not present

## 2022-04-27 MED ORDER — HYDROCODONE-ACETAMINOPHEN 5-325 MG PO TABS: 1.0000 | ORAL_TABLET | Freq: Four times a day (QID) | ORAL | 0 refills | Status: DC | PRN

## 2022-04-27 NOTE — Progress Notes (Signed)
Office Visit Note   Patient: Tommy Jackson           Date of Birth: 1955-06-01           MRN: [id number] Visit Date: 04/27/2022              Requested by: Dorothyann Peng, NP [address],  [place] [address] PCP: Dorothyann Peng, NP   Assessment & Plan: Visit Diagnoses:  1. Pain in right hip   2. Primary osteoarthritis of right hip   3. Class 3 severe obesity with body mass index (BMI) of 40.0 to 44.9 in adult, unspecified obesity type, unspecified whether serious comorbidity present [facility])     Plan: Discussed with the patient the need to have him lose some weight prior to scheduling for right total hip arthroplasty.  He also need to continue to have good control of his diabetes.  Will see him back in 1 month and obtain a height and weight on him.  Did give him a refill on his Norco he will start doing this from my assistant with his primary care physician.  Like for him to use this sparingly.  He can take some Tylenol during the day.  Questions were encouraged and answered at length today.  Follow-Up Instructions: Return in about 4 weeks (around 05/25/2022).   Orders:  Orders Placed This Encounter  Procedures   XR HIP UNILAT W OR W/O PELVIS 2-3 VIEWS RIGHT   Meds ordered this encounter  Medications   HYDROcodone-acetaminophen (NORCO) 5-325 MG tablet    Sig: Take 1 tablet by mouth every 6 (six) hours as needed for moderate pain.    Dispense:  30 tablet    Refill:  0      Procedures: No procedures performed   Clinical Data: No additional findings.   Subjective: Chief Complaint  Patient presents with   Right Hip - Pain    HPI [name] is a 67 year old male comes in today with right hip pain.  We have seen him in the past however over the last few months he has developed severe right hip pain.  He has denies any injury.  He is having to use a cane to ambulate.  He is having difficulty sleeping at night and is having to sleep in a recliner due to his hip  pain.  Notes he has groin pain is constant.  He states his pain to be dull at times.  4 out of 10 to severe to pain at times.  Pain is taken some hydrocodone that he had from his primary care physician at night mostly.  He is also tried Retail banker and old lidocaine patches.  He states he is prediabetic.  His last hemoglobin A1c was 6.6. MRI was ordered by his primary provider.  Dated 04/10/2022 shows right hip arthropathy with hip joint effusion.  Also moderate amount of fluid around the right iliopsoas bursa region.  Subchondral edema of the right femoral head and acetabulum which most likely are due to the osteoarthritis of the hip.  Small paralabral cysts seen.  No acute osseous findings.   Review of Systems  Constitutional:  Negative for chills and fever.  Cardiovascular:  Negative for chest pain.  Denies any orthopnea Denies any ongoing infections.   Objective: Vital Signs: Ht 6' (1.829 m)   Wt (!) 318 lb (144.2 kg)   BMI 43.13 kg/m   Physical Exam Constitutional:      Appearance: He is not ill-appearing or diaphoretic.  Pulmonary:     Effort: Pulmonary effort is normal.  Neurological:     Mental Status: He is alert and oriented to person, place, and time.  Psychiatric:        Mood and Affect: Mood normal.     Ortho Exam Left hip excellent range of motion without pain.  Right hip decreased internal rotation with pain.  Limited external rotation with some discomfort.  Calves are supple nontender bilaterally.  Dorsiflexion plantarflexion bilateral ankles intact.  Was able to lay flat and he has a mobile abdomen.  The area of surgical incision with him lying down is assessable.  Specialty Comments:  No specialty comments available.  Imaging: XR HIP UNILAT W OR W/O PELVIS 2-3 VIEWS RIGHT  Result Date: 04/27/2022 AP pelvis lateral view right hip: Bilateral hips well located.  Bone-on-bone right hip.  No acute fractures or acute findings.  Left hip appears well-preserved.     PMFS History: Patient Active Problem List   Diagnosis Date Noted   Prediabetes 02/18/2015   Left leg pain 09/01/2013   Left knee pain 09/01/2013   Left hip pain 08/04/2013   Allergic rhinitis 04/09/2013   Other abnormal glucose 10/24/2012   Dermatitis 05/07/2012   Cervical radiculopathy 02/22/2012   HERNIATED DISC 05/11/2010   Asthma 09/07/2009   Obesity 10/01/2007   Obstructive sleep apnea 10/01/2007   Dyslipidemia 02/07/2007   Essential hypertension 11/29/2005   Past Medical History:  Diagnosis Date   Allergy    ASTHMA 09/07/2009   Herniated disc    HYPERLIPIDEMIA 02/07/2007   HYPERTENSION 11/29/2005   OBESITY 10/01/2007   Sleep apnea    cpap   SLEEP APNEA, OBSTRUCTIVE 10/01/2007    Family History  Problem Relation Age of Onset   Colon cancer Father 93   Lung cancer Father    CAD Father    Diabetes Father    Heart attack Father    Colon cancer Brother 68   Heart attack Paternal Grandfather    CAD Brother    Esophageal cancer Neg Hx    Stomach cancer Neg Hx    Rectal cancer Neg Hx     Past Surgical History:  Procedure Laterality Date   bicep surgery Right 2014   COLONOSCOPY     ELBOW SURGERY Right 2013   POLYPECTOMY     removal disc neck  2015   SHOULDER SURGERY Right 1990   Social History   Occupational History   Occupation: retired truck Chief Technology Officer term disability    Employer: RETIRED  Tobacco Use   Smoking status: Never   Smokeless tobacco: Never  Vaping Use   Vaping Use: Never used  Substance and Sexual Activity   Alcohol use: Yes    Alcohol/week: 0.0 standard drinks of alcohol    Comment: rare alcohol intake   Drug use: No   Sexual activity: Not on file

## 2022-04-28 ENCOUNTER — Telehealth: Payer: Self-pay | Admitting: Physician Assistant

## 2022-04-28 NOTE — Telephone Encounter (Signed)
FYI

## 2022-04-28 NOTE — Telephone Encounter (Signed)
Patient asking about his pain medication. Please advise

## 2022-04-28 NOTE — Telephone Encounter (Signed)
He said he never got an rx and it wasn't at drug store

## 2022-04-30 ENCOUNTER — Other Ambulatory Visit: Payer: Self-pay | Admitting: Adult Health

## 2022-04-30 DIAGNOSIS — I10 Essential (primary) hypertension: Secondary | ICD-10-CM

## 2022-05-01 ENCOUNTER — Other Ambulatory Visit: Payer: Self-pay | Admitting: Physician Assistant

## 2022-05-01 ENCOUNTER — Telehealth: Payer: Self-pay | Admitting: Physician Assistant

## 2022-05-01 MED ORDER — HYDROCODONE-ACETAMINOPHEN 5-325 MG PO TABS: 1.0000 | ORAL_TABLET | Freq: Four times a day (QID) | ORAL | 0 refills | Status: DC | PRN

## 2022-05-01 NOTE — Telephone Encounter (Signed)
Unable to reach pt. Can you please just sent to pharmacy in chart

## 2022-05-01 NOTE — Telephone Encounter (Signed)
Lvm for pt to cb with this information

## 2022-05-01 NOTE — Telephone Encounter (Signed)
Patient called advised the Rx for Hydrocodone is not showing at the pharmacy. Patient said he jst checked. Patient [facility] [address] [facility] [place]. The number to contact patient is [phone]

## 2022-05-04 ENCOUNTER — Encounter: Payer: Self-pay | Admitting: Radiology

## 2022-06-01 ENCOUNTER — Ambulatory Visit (INDEPENDENT_AMBULATORY_CARE_PROVIDER_SITE_OTHER): Payer: Medicare Other | Admitting: Physician Assistant

## 2022-06-01 ENCOUNTER — Encounter: Payer: Self-pay | Admitting: Physician Assistant

## 2022-06-01 VITALS — Ht 72.0 in | Wt 303.0 lb

## 2022-06-01 DIAGNOSIS — M1611 Unilateral primary osteoarthritis, right hip: Secondary | ICD-10-CM

## 2022-06-01 MED ORDER — HYDROCODONE-ACETAMINOPHEN 5-325 MG PO TABS: 1.0000 | ORAL_TABLET | Freq: Four times a day (QID) | ORAL | 0 refills | Status: DC | PRN

## 2022-06-01 NOTE — Progress Notes (Signed)
HPI: Tommy Jackson returns today for follow-up of his right hip osteoarthritis.  He has been working on weight loss.  He continues to have severe pain in his right hip that affects his quality of life.  He is having to use a cane to ambulate.  He continues to take hydrocodone at night due to hip pain pain in the hip.  His pain is became severe.  He reports that he is prediabetic.  His last hemoglobin A1c was 6.6.  He is on no anticoagulants.  Denies any history of DVT personally or with any close relatives.  Review of systems: Denies any fevers chills.  Physical exam: Height 6 foot weight 303 pounds BMI 41.09 General well-developed well-nourished male" with an antalgic gait and the use of a cane in his left hand.  Impression: Right hip osteoarthritis  Plan: Given patients severe right hip osteoarthritis which has failed conservative treatment.  Treatments included offloading the hip with a cane, medications and weight loss.  He is lost some 15 pounds since we first saw him.  Recommend that he continue to work on weight loss.  Recommend right total hip arthroplasty.  He is agreeable with this.  Risk benefits of surgery discussed postoperative protocol also discussed.  Risk include but is not limited to DVT/PE, leg length discrepancy, nerve or vessel injury, blood loss, wound healing problems, prolonged pain worsening pain and infection.  Handout was given on anterior total hip arthroplasty.  Hip components were shown.  Questions were encouraged and answered by Dr. Ninfa Linden and myself.  Will work on scheduling in the near future.

## 2022-06-16 ENCOUNTER — Ambulatory Visit (INDEPENDENT_AMBULATORY_CARE_PROVIDER_SITE_OTHER): Payer: Medicare Other | Admitting: Adult Health

## 2022-06-16 ENCOUNTER — Encounter: Payer: Self-pay | Admitting: Adult Health

## 2022-06-16 VITALS — BP 140/100 | HR 85 | Temp 98.1°F | Ht 72.0 in | Wt 300.0 lb

## 2022-06-16 DIAGNOSIS — R351 Nocturia: Secondary | ICD-10-CM | POA: Diagnosis not present

## 2022-06-16 DIAGNOSIS — Z7985 Long-term (current) use of injectable non-insulin antidiabetic drugs: Secondary | ICD-10-CM | POA: Diagnosis not present

## 2022-06-16 DIAGNOSIS — J4520 Mild intermittent asthma, uncomplicated: Secondary | ICD-10-CM

## 2022-06-16 DIAGNOSIS — E785 Hyperlipidemia, unspecified: Secondary | ICD-10-CM | POA: Diagnosis not present

## 2022-06-16 DIAGNOSIS — Z23 Encounter for immunization: Secondary | ICD-10-CM | POA: Diagnosis not present

## 2022-06-16 DIAGNOSIS — E1169 Type 2 diabetes mellitus with other specified complication: Secondary | ICD-10-CM | POA: Diagnosis not present

## 2022-06-16 DIAGNOSIS — M1611 Unilateral primary osteoarthritis, right hip: Secondary | ICD-10-CM | POA: Diagnosis not present

## 2022-06-16 DIAGNOSIS — I10 Essential (primary) hypertension: Secondary | ICD-10-CM | POA: Diagnosis not present

## 2022-06-16 DIAGNOSIS — N401 Enlarged prostate with lower urinary tract symptoms: Secondary | ICD-10-CM

## 2022-06-16 LAB — LIPID PANEL
Cholesterol: 135 mg/dL (ref 0–200)
HDL: 36.5 mg/dL — ABNORMAL LOW (ref >39.00)
NonHDL: 98.62
Total CHOL/HDL Ratio: 4
Triglycerides: 275 mg/dL — ABNORMAL HIGH (ref 0.0–149.0)
VLDL: 55 mg/dL — ABNORMAL HIGH (ref 0.0–40.0)

## 2022-06-16 LAB — COMPREHENSIVE METABOLIC PANEL
ALT: 42 U/L (ref 0–53)
AST: 26 U/L (ref 0–37)
Albumin: 4.8 g/dL (ref 3.5–5.2)
Alkaline Phosphatase: 55 U/L (ref 39–117)
BUN: 16 mg/dL (ref 6–23)
CO2: 29 mEq/L (ref 19–32)
Calcium: 10.2 mg/dL (ref 8.4–10.5)
Chloride: 100 mEq/L (ref 96–112)
Creatinine, Ser: 0.93 mg/dL (ref 0.40–1.50)
GFR: 85.92 mL/min (ref >60.00)
Glucose, Bld: 105 mg/dL — ABNORMAL HIGH (ref 70–99)
Potassium: 3.7 mEq/L (ref 3.5–5.1)
Sodium: 142 mEq/L (ref 135–145)
Total Bilirubin: 0.7 mg/dL (ref 0.2–1.2)
Total Protein: 7.3 g/dL (ref 6.0–8.3)

## 2022-06-16 LAB — CBC
HCT: 45.7 % (ref 39.0–52.0)
Hemoglobin: 15.7 g/dL (ref 13.0–17.0)
MCHC: 34.4 g/dL (ref 30.0–36.0)
MCV: 92.8 fl (ref 78.0–100.0)
Platelets: 192 10*3/uL (ref 150.0–400.0)
RBC: 4.92 Mil/uL (ref 4.22–5.81)
RDW: 13 % (ref 11.5–15.5)
WBC: 6.7 10*3/uL (ref 4.0–10.5)

## 2022-06-16 LAB — TSH: TSH: 2.04 u[IU]/mL (ref 0.35–5.50)

## 2022-06-16 LAB — HEMOGLOBIN A1C: Hgb A1c MFr Bld: 5.9 % (ref 4.6–6.5)

## 2022-06-16 LAB — LDL CHOLESTEROL, DIRECT: Direct LDL: 59 mg/dL

## 2022-06-16 LAB — PSA: PSA: 0.88 ng/mL (ref 0.10–4.00)

## 2022-06-16 MED ORDER — FENOFIBRATE 145 MG PO TABS
145.0000 mg | ORAL_TABLET | Freq: Every day | ORAL | 3 refills | Status: DC
Start: 2022-06-16 — End: 2023-03-27

## 2022-06-16 NOTE — Progress Notes (Signed)
Subjective:    Patient ID: Tommy Jackson, male    DOB: 1955-12-31, 67 y.o.   MRN: [id number]  HPI Patient presents for yearly preventative medicine examination. He is a pleasant 67 year old male who  has a past medical history of Allergy, ASTHMA (09/07/2009), Herniated disc, HYPERLIPIDEMIA (02/07/2007), HYPERTENSION (11/29/2005), OBESITY (10/01/2007), Sleep apnea, and SLEEP APNEA, OBSTRUCTIVE (10/01/2007).  DM -he is currently managed with Trulicity 1.5 mg weekly.  He has not had any side effects of this medication.  Does report early satiety has helped him cut back on portion sizes but continues to snack in the late night.   In the past we have trialed him on metformin but this caused subsequently stopped. Lab Results  Component Value Date   HGBA1C 6.6 (H) 02/15/2022   Essential Hypertension - prescribed Norvasc 10 mg, Diovan HCTZ 320-25 mg, Lopressor 100 mg, and Lasix 20 mg.  He denies dizziness, lightheadedness, chest pain, shortness of breath, syncopal episodes.  He does monitor his blood pressure at home and reports .readings in the 120/80's  He did not take his blood pressure medication at this time.  BP Readings from Last 3 Encounters:  06/16/22 (!) 140/100  03/30/22 (!) 141/91  03/15/22 (!) 140/90   Hyperlipidemia -prescribed pravastatin 40 mg and fenofibrate 145 mg daily.  He denies myalgia or fatigue Lab Results  Component Value Date   CHOL 119 09/16/2020   HDL 40.70 09/16/2020   LDLCALC 43 09/16/2020   LDLDIRECT 69.0 04/08/2019   TRIG 175.0 (H) 09/16/2020   CHOLHDL 3 09/16/2020   Asthma -mild and intermittent.  He uses rescue inhaler as needed  Morbid Obesity- he has been able to lose about 25 pounds since December 2023. This is though diet due to chronic right shoulder pain.  Wt Readings from Last 10 Encounters:  06/16/22 300 lb (136.1 kg)  06/01/22 (!) 303 lb (137.4 kg)  04/27/22 (!) 318 lb (144.2 kg)  03/30/22 (!) 322 lb (146.1 kg)  03/15/22 (!) 322 lb (146.1 kg)   02/15/22 (!) 325 lb (147.4 kg)  12/20/21 (!) 327 lb 12.8 oz (148.7 kg)  10/18/21 (!) 322 lb (146.1 kg)  04/06/21 (!) 317 lb (143.8 kg)  03/21/21 (!) 308 lb (139.7 kg)   Right Hip Osteoarthritis - Has end stage osteoarthritis in right hip. He has been seen recently by orthopedics and has lost enough weight that they will proceed with total hip replacement. His surgery is set for May 31st.   BPH - asymptomatic    All immunizations and health maintenance protocols were reviewed with the patient and needed orders were placed.  Appropriate screening laboratory values were ordered for the patient including screening of hyperlipidemia, renal function and hepatic function. If indicated by BPH, a PSA was ordered.  Medication reconciliation,  past medical history, social history, problem list and allergies were reviewed in detail with the patient  Goals were established with regard to weight loss, exercise, and  diet in compliance with medications  He is up to date on routine colon cancer screening    Review of Systems  Constitutional: Negative.   HENT: Negative.    Eyes: Negative.   Respiratory: Negative.    Cardiovascular: Negative.   Gastrointestinal: Negative.   Endocrine: Negative.   Genitourinary: Negative.   Musculoskeletal:  Positive for arthralgias, back pain and gait problem.  Skin: Negative.   Allergic/Immunologic: Negative.   Hematological: Negative.   Psychiatric/Behavioral: Negative.    All other systems reviewed and are negative.  Past Medical History:  Diagnosis Date   Allergy    ASTHMA 09/07/2009   Herniated disc    HYPERLIPIDEMIA 02/07/2007   HYPERTENSION 11/29/2005   OBESITY 10/01/2007   Sleep apnea    cpap   SLEEP APNEA, OBSTRUCTIVE 10/01/2007    Social History   Socioeconomic History   Marital status: Married    Spouse name: Not on file   Number of children: 3   Years of education: Not on file   Highest education level: Not on file  Occupational  History   Occupation: retired truck driver,long term disability    Employer: RETIRED  Tobacco Use   Smoking status: Never   Smokeless tobacco: Never  Vaping Use   Vaping Use: Never used  Substance and Sexual Activity   Alcohol use: Yes    Alcohol/week: 0.0 standard drinks of alcohol    Comment: rare alcohol intake   Drug use: No   Sexual activity: Not on file  Other Topics Concern   Not on file  Social History Narrative   Not on file   Social Determinants of Health   Financial Resource Strain: Unknown (04/02/2021)   Overall Financial Resource Strain (CARDIA)    Difficulty of Paying Living Expenses: Patient declined  Food Insecurity: Unknown (04/02/2021)   Hunger Vital Sign    Worried About Running Out of Food in the Last Year: Patient declined    Ran Out of Food in the Last Year: Patient declined  Transportation Needs: Unknown (04/02/2021)   PRAPARE - Administrator, Civil Service (Medical): Patient declined    Lack of Transportation (Non-Medical): Patient declined  Physical Activity: Unknown (04/02/2021)   Exercise Vital Sign    Days of Exercise per Week: Patient declined    Minutes of Exercise per Session: Not on file  Recent Concern: Physical Activity - Inactive (03/21/2021)   Exercise Vital Sign    Days of Exercise per Week: 0 days    Minutes of Exercise per Session: 0 min  Stress: No Stress Concern Present (04/02/2021)   [employer] of Occupational Health - Occupational Stress Questionnaire    Feeling of Stress : Not at all  Social Connections: Unknown (04/02/2021)   Social Connection and Isolation Panel [NHANES]    Frequency of Communication with Friends and Family: Patient declined    Frequency of Social Gatherings with Friends and Family: Patient declined    Attends Religious Services: Patient declined    Database administrator or Organizations: Patient declined    Attends Banker Meetings: Not on file    Marital Status: Patient declined   Intimate Partner Violence: Not At Risk (03/23/2020)   Humiliation, Afraid, Rape, and Kick questionnaire    Fear of Current or Ex-Partner: No    Emotionally Abused: No    Physically Abused: No    Sexually Abused: No    Past Surgical History:  Procedure Laterality Date   bicep surgery Right 2014   COLONOSCOPY     ELBOW SURGERY Right 2013   POLYPECTOMY     removal disc neck  2015   SHOULDER SURGERY Right 1990    Family History  Problem Relation Age of Onset   Colon cancer Father 66   Lung cancer Father    CAD Father    Diabetes Father    Heart attack Father    Colon cancer Brother 60   Heart attack Paternal Grandfather    CAD Brother    Esophageal cancer Neg Hx  Stomach cancer Neg Hx    Rectal cancer Neg Hx     Allergies  Allergen Reactions   Aspirin     REACTION: unspecified   Glycerin Nausea Only   Penicillins     REACTION: family hx   Metformin And Related Diarrhea    Current Outpatient Medications on File Prior to Visit  Medication Sig Dispense Refill   albuterol (VENTOLIN HFA) 108 (90 Base) MCG/ACT inhaler Inhale 2 puffs into the lungs every 4 (four) hours as needed. 6.7 g 3   amLODipine (NORVASC) 10 MG tablet TAKE 1 TABLET BY MOUTH EVERY DAY 90 tablet 0   Ascorbic Acid (VITAMIN C) 100 MG tablet Take 500 mg by mouth daily.     azelastine (ASTELIN) 0.1 % nasal spray Place into the nose.     cetirizine (ZYRTEC) 10 MG tablet Take by mouth.     fenofibrate (TRICOR) 145 MG tablet Take 1 tablet (145 mg total) by mouth daily. 90 tablet 0   fluticasone (FLONASE) 50 MCG/ACT nasal spray Place into the nose.     furosemide (LASIX) 20 MG tablet Take 1 tablet (20 mg total) by mouth daily. 90 tablet 3   HYDROcodone-acetaminophen (NORCO) 5-325 MG tablet Take 1 tablet by mouth every 6 (six) hours as needed for moderate pain. 30 tablet 0   meloxicam (MOBIC) 15 MG tablet Take 1 tablet by mouth daily.     metoprolol tartrate (LOPRESSOR) 100 MG tablet Take 1 tablet (100 mg  total) by mouth 2 (two) times daily. 180 tablet 3   Multiple Vitamin (MULTIVITAMIN) capsule Take 1 capsule by mouth daily.     potassium chloride (KLOR-CON M) 10 MEQ tablet Take 1 tablet (10 mEq total) by mouth daily. 90 tablet 3   pravastatin (PRAVACHOL) 40 MG tablet Take 1 tablet (40 mg total) by mouth daily. 90 tablet 3   TRULICITY 1.5 MG/0.5ML SOPN INJECT 1.5 MG (0.5ML) UNDER THE SKIN ONCE A WEEK 8 mL 0   valsartan-hydrochlorothiazide (DIOVAN-HCT) 320-25 MG tablet Take 1 tablet by mouth daily. 90 tablet 3   Multiple Vitamin (MULTIVITAMIN ADULT PO) Take 1 capsule by mouth daily.     No current facility-administered medications on file prior to visit.    BP (!) 140/100   Pulse 85   Temp 98.1 F (36.7 C) (Oral)   Ht 6' (1.829 m)   Wt 300 lb (136.1 kg)   SpO2 98%   BMI 40.69 kg/m       Objective:   Physical Exam Vitals and nursing note reviewed.  Constitutional:      General: He is not in acute distress.    Appearance: Normal appearance. He is obese. He is not ill-appearing.  HENT:     Head: Normocephalic and atraumatic.     Right Ear: Tympanic membrane, ear canal and external ear normal. There is no impacted cerumen.     Left Ear: Tympanic membrane, ear canal and external ear normal. There is no impacted cerumen.     Nose: Nose normal. No congestion or rhinorrhea.     Mouth/Throat:     Mouth: Mucous membranes are moist.     Pharynx: Oropharynx is clear.  Eyes:     Extraocular Movements: Extraocular movements intact.     Conjunctiva/sclera: Conjunctivae normal.     Pupils: Pupils are equal, round, and reactive to light.  Neck:     Vascular: No carotid bruit.  Cardiovascular:     Rate and Rhythm: Normal rate and regular rhythm.  Pulses: Normal pulses.     Heart sounds: Normal heart sounds. No murmur heard.    No friction rub. No gallop.  Pulmonary:     Effort: Pulmonary effort is normal.     Breath sounds: Normal breath sounds.  Abdominal:     General: Abdomen is  flat. Bowel sounds are normal. There is no distension.     Palpations: Abdomen is soft. There is no mass.     Tenderness: There is no abdominal tenderness. There is no guarding or rebound.     Hernia: No hernia is present.  Musculoskeletal:        General: Normal range of motion.     Cervical back: Normal range of motion and neck supple.  Lymphadenopathy:     Cervical: No cervical adenopathy.  Skin:    General: Skin is warm and dry.     Capillary Refill: Capillary refill takes less than 2 seconds.  Neurological:     General: No focal deficit present.     Mental Status: He is alert and oriented to person, place, and time.     Motor: Weakness present.     Gait: Gait abnormal (use cane to ambulate).  Psychiatric:        Mood and Affect: Mood normal.        Behavior: Behavior normal.        Thought Content: Thought content normal.        Judgment: Judgment normal.        Assessment & Plan:  1. Type 2 diabetes mellitus with other specified complication, unspecified whether long term insulin use - Consider increase in trulicity  - Lipid panel; Future - TSH; Future - CBC; Future - Comprehensive metabolic panel; Future - Hemoglobin A1c; Future  2. Dyslipidemia - Consider higher intensity statin  - Lipid panel; Future - TSH; Future - CBC; Future - Comprehensive metabolic panel; Future - Hemoglobin A1c; Future - fenofibrate (TRICOR) 145 MG tablet; Take 1 tablet (145 mg total) by mouth daily.  Dispense: 90 tablet; Refill: 3  3. Essential hypertension -  - Lipid panel; Future - TSH; Future - CBC; Future - Comprehensive metabolic panel; Future - Hemoglobin A1c; Future  4. Mild intermittent asthma without complication - Continue rescue inhaler  - Lipid panel; Future - TSH; Future - CBC; Future - Comprehensive metabolic panel; Future - Hemoglobin A1c; Future  5. Morbid obesity - Continue with weight loss measures. Hopefully once he has his hip replacement he will be more  active and he will be able to exercise  - Lipid panel; Future - TSH; Future - CBC; Future - Comprehensive metabolic panel; Future - Hemoglobin A1c; Future  6. Benign prostatic hyperplasia with nocturia  - PSA; Future  7. Primary osteoarthritis of right hip - Per general surgery  - Lipid panel; Future - TSH; Future - CBC; Future - Comprehensive metabolic panel; Future - Hemoglobin A1c; Future   Shirline Frees, NP

## 2022-06-29 ENCOUNTER — Other Ambulatory Visit: Payer: Self-pay

## 2022-07-03 ENCOUNTER — Other Ambulatory Visit: Payer: Self-pay | Admitting: Physician Assistant

## 2022-07-03 ENCOUNTER — Telehealth: Payer: Self-pay | Admitting: Orthopaedic Surgery

## 2022-07-03 MED ORDER — HYDROCODONE-ACETAMINOPHEN 5-325 MG PO TABS: 1.0000 | ORAL_TABLET | Freq: Four times a day (QID) | ORAL | 0 refills | Status: DC | PRN

## 2022-07-03 NOTE — Telephone Encounter (Signed)
Patient would like refill on Hydrocodone sent to [facility] on file

## 2022-07-14 NOTE — Progress Notes (Signed)
COVID Vaccine Completed:  Date of COVID positive in last 90 days:  PCP - [name], NP Cardiologist -  Earney Hamburg, MD (Last OV 2013) Pulmonologist - Cyril Mourning, MD  Chest x-ray -  EKG - 07-17-22 Epic Stress Test - 12-21-11 Epic ECHO - 08-15-16 Epic Cardiac Cath -  Pacemaker/ICD device last checked: Spinal Cord Stimulator:  Bowel Prep -   Sleep Study -  CPAP -   Fasting Blood Sugar -  Checks Blood Sugar _____ times a day  Last dose of GLP1 agonist-  N/A GLP1 instructions:  N/A   Last dose of SGLT-2 inhibitors-  N/A SGLT-2 instructions: N/A   Blood Thinner Instructions:  Time Aspirin Instructions: Last Dose:  Activity level:  Can go up a flight of stairs and perform activities of daily living without stopping and without symptoms of chest pain or shortness of breath.  Able to exercise without symptoms  Unable to go up a flight of stairs without symptoms of     Anesthesia review:  Dyspnea and LVH evaluated by cardiology  Patient denies shortness of breath, fever, cough and chest pain at PAT appointment  Patient verbalized understanding of instructions that were given to them at the PAT appointment. Patient was also instructed that they will need to review over the PAT instructions again at home before surgery.

## 2022-07-14 NOTE — Patient Instructions (Signed)
SURGICAL WAITING ROOM VISITATION Patients having surgery or a procedure may have no more than 2 support people in the waiting area - these visitors may rotate.    Children under the age of 75 must have an adult with them who is not the patient.  If the patient needs to stay at the hospital during part of their recovery, the visitor guidelines for inpatient rooms apply. Pre-op nurse will coordinate an appropriate time for 1 support person to accompany patient in pre-op.  This support person may not rotate.    Please refer to the [facility] website for the visitor guidelines for Inpatients (after your surgery is over and you are in a regular room).       Your procedure is scheduled on: 07-28-22   Report to [facility] Main Entrance    Report to admitting at 10:30 AM   Call this number if you have problems the morning of surgery [phone]   Do not eat food :After Midnight.   After Midnight you may have the following liquids until 10:00 AM DAY OF SURGERY  Water Non-Citrus Juices (without pulp, NO RED-Apple, White grape, White cranberry) Black Coffee (NO MILK/CREAM OR CREAMERS, sugar ok)  Clear Tea (NO MILK/CREAM OR CREAMERS, sugar ok) regular and decaf                             Plain Jell-O (NO RED)                                           Fruit ices (not with fruit pulp, NO RED)                                     Popsicles (NO RED)                                                               Sports drinks like Gatorade (NO RED)                   The day of surgery:  Drink ONE (1) Pre-Surgery G2 at 10:00 AM the morning of surgery. Drink in one sitting. Do not sip.  This drink was given to you during your hospital  pre-op appointment visit. Nothing else to drink after completing the Pre-Surgery G2.          If you have questions, please contact your surgeon's office.   FOLLOW  ANY ADDITIONAL PRE OP INSTRUCTIONS YOU RECEIVED FROM YOUR SURGEON'S OFFICE!!!      Oral Hygiene is also important to reduce your risk of infection.                                    Remember - BRUSH YOUR TEETH THE MORNING OF SURGERY WITH YOUR REGULAR TOOTHPASTE   Do NOT smoke after Midnight   Take these medicines the morning of surgery with A SIP OF WATER:   Amlodipine  Zyrtec  Fenofibrate  Metoprolol  Pravastatin  Hydrocodone if needed  Okay to use inhalers and nasal spray  How to Manage Your Diabetes Before and After Surgery  Why is it important to control my blood sugar before and after surgery? Improving blood sugar levels before and after surgery helps healing and can limit problems. A way of improving blood sugar control is eating a healthy diet by:  Eating less sugar and carbohydrates  Increasing activity/exercise  Talking with your doctor about reaching your blood sugar goals High blood sugars (greater than 180 mg/dL) can raise your risk of infections and slow your recovery, so you will need to focus on controlling your diabetes during the weeks before surgery. Make sure that the doctor who takes care of your diabetes knows about your planned surgery including the date and location.  How do I manage my blood sugar before surgery? Check your blood sugar at least 4 times a day, starting 2 days before surgery, to make sure that the level is not too high or low. Check your blood sugar the morning of your surgery when you wake up and every 2 hours until you get to the Short Stay unit. If your blood sugar is less than 70 mg/dL, you will need to treat for low blood sugar: Do not take insulin. Treat a low blood sugar (less than 70 mg/dL) with  cup of clear juice (cranberry or apple), 4 glucose tablets, OR glucose gel. Recheck blood sugar in 15 minutes after treatment (to make sure it is greater than 70 mg/dL). If your blood sugar is not greater than 70 mg/dL on recheck, call [phone] for further instructions. Report your blood sugar to the short stay nurse  when you get to Short Stay.  If you are admitted to the hospital after surgery: Your blood sugar will be checked by the staff and you will probably be given insulin after surgery (instead of oral diabetes medicines) to make sure you have good blood sugar levels. The goal for blood sugar control after surgery is 80-180 mg/dL.   WHAT DO I DO ABOUT MY DIABETES MEDICATION?  Do not take oral diabetes medicines (pills) the morning of surgery.        Hold Trulicity 7 days before surgery (do not take after 07-20-22)  DO NOT TAKE THE FOLLOWING 7 DAYS PRIOR TO SURGERY: Ozempic, Wegovy, Rybelsus (Semaglutide), Byetta (exenatide), Bydureon (exenatide ER), Victoza, Saxenda (liraglutide), or Trulicity (dulaglutide) Mounjaro (Tirzepatide) Adlyxin (Lixisenatide), Polyethylene Glycol Loxenatide.   Reviewed and Endorsed by [facility] Patient Education Committee, August 2015  Bring CPAP mask and tubing day of surgery.                              You may not have any metal on your body including jewelry, and body piercing             Do not wear lotions, powders, cologne, or deodorant              Men may shave face and neck.   Do not bring valuables to the hospital. [facility] IS NOT RESPONSIBLE   FOR VALUABLES.   Contacts, dentures or bridgework may not be worn into surgery.   Bring small overnight bag day of surgery.   DO NOT BRING YOUR HOME MEDICATIONS TO THE HOSPITAL. PHARMACY WILL DISPENSE MEDICATIONS LISTED ON YOUR MEDICATION LIST TO YOU DURING YOUR ADMISSION IN THE HOSPITAL!     Special Instructions: Bring a  copy of your healthcare power of attorney and living will documents the day of surgery if you haven't scanned them before.              Please read over the following fact sheets you were given: IF YOU HAVE QUESTIONS ABOUT YOUR PRE-OP INSTRUCTIONS PLEASE CALL [phone] [name]  If you received a COVID test during your pre-op visit  it is requested that you wear a mask when out in  public, stay away from anyone that may not be feeling well and notify your surgeon if you develop symptoms. If you test positive for Covid or have been in contact with anyone that has tested positive in the last 10 days please notify you surgeon.    Pre-operative 5 CHG Bath Instructions   You can play a key role in reducing the risk of infection after surgery. Your skin needs to be as free of germs as possible. You can reduce the number of germs on your skin by washing with CHG (chlorhexidine gluconate) soap before surgery. CHG is an antiseptic soap that kills germs and continues to kill germs even after washing.   DO NOT use if you have an allergy to chlorhexidine/CHG or antibacterial soaps. If your skin becomes reddened or irritated, stop using the CHG and notify one of our RNs at  [phone] .   Please shower with the CHG soap starting 4 days before surgery using the following schedule:     Please keep in mind the following:  DO NOT shave, including legs and underarms, starting the day of your first shower.   You may shave your face at any point before/day of surgery.  Place clean sheets on your bed the day you start using CHG soap. Use a clean washcloth (not used since being washed) for each shower. DO NOT sleep with pets once you start using the CHG.   CHG Shower Instructions:  If you choose to wash your hair and private area, wash first with your normal shampoo/soap.  After you use shampoo/soap, rinse your hair and body thoroughly to remove shampoo/soap residue.  Turn the water OFF and apply about 3 tablespoons (45 ml) of CHG soap to a CLEAN washcloth.  Apply CHG soap ONLY FROM YOUR NECK DOWN TO YOUR TOES (washing for 3-5 minutes)  DO NOT use CHG soap on face, private areas, open wounds, or sores.  Pay special attention to the area where your surgery is being performed.  If you are having back surgery, having someone wash your back for you may be helpful. Wait 2 minutes after CHG  soap is applied, then you may rinse off the CHG soap.  Pat dry with a clean towel  Put on clean clothes/pajamas   If you choose to wear lotion, please use ONLY the CHG-compatible lotions on the back of this paper.     Additional instructions for the day of surgery: DO NOT APPLY any lotions, deodorants, cologne, or perfumes.   Put on clean/comfortable clothes.  Brush your teeth.  Ask your nurse before applying any prescription medications to the skin.      CHG Compatible Lotions   Aveeno Moisturizing lotion  Cetaphil Moisturizing Cream  Cetaphil Moisturizing Lotion  Clairol Herbal Essence Moisturizing Lotion, Dry Skin  Clairol Herbal Essence Moisturizing Lotion, Extra Dry Skin  Clairol Herbal Essence Moisturizing Lotion, Normal Skin  Curel Age Defying Therapeutic Moisturizing Lotion with Alpha Hydroxy  Curel Extreme Care Body Lotion  Curel Soothing Hands Moisturizing Hand Lotion  Curel Therapeutic Moisturizing Cream, Fragrance-Free  Curel Therapeutic Moisturizing Lotion, Fragrance-Free  Curel Therapeutic Moisturizing Lotion, Original Formula  Eucerin Daily Replenishing Lotion  Eucerin Dry Skin Therapy Plus Alpha Hydroxy Crme  Eucerin Dry Skin Therapy Plus Alpha Hydroxy Lotion  Eucerin Original Crme  Eucerin Original Lotion  Eucerin Plus Crme Eucerin Plus Lotion  Eucerin TriLipid Replenishing Lotion  Keri Anti-Bacterial Hand Lotion  Keri Deep Conditioning Original Lotion Dry Skin Formula Softly Scented  Keri Deep Conditioning Original Lotion, Fragrance Free Sensitive Skin Formula  Keri Lotion Fast Absorbing Fragrance Free Sensitive Skin Formula  Keri Lotion Fast Absorbing Softly Scented Dry Skin Formula  Keri Original Lotion  Keri Skin Renewal Lotion Keri Silky Smooth Lotion  Keri Silky Smooth Sensitive Skin Lotion  Nivea Body Creamy Conditioning Oil  Nivea Body Extra Enriched Lotion  Nivea Body Original Lotion  Nivea Body Sheer Moisturizing Lotion Nivea Crme  Nivea  Skin Firming Lotion  NutraDerm 30 Skin Lotion  NutraDerm Skin Lotion  NutraDerm Therapeutic Skin Cream  NutraDerm Therapeutic Skin Lotion  ProShield Protective Hand Cream  Provon moisturizing lotion   PATIENT SIGNATURE_________________________________  NURSE SIGNATURE__________________________________  ________________________________________________________________________    Tommy Jackson  An incentive spirometer is a tool that can help keep your lungs clear and active. This tool measures how well you are filling your lungs with each breath. Taking long deep breaths may help reverse or decrease the chance of developing breathing (pulmonary) problems (especially infection) following: A long period of time when you are unable to move or be active. BEFORE THE PROCEDURE  If the spirometer includes an indicator to show your best effort, your nurse or respiratory therapist will set it to a desired goal. If possible, sit up straight or lean slightly forward. Try not to slouch. Hold the incentive spirometer in an upright position. INSTRUCTIONS FOR USE  Sit on the edge of your bed if possible, or sit up as far as you can in bed or on a chair. Hold the incentive spirometer in an upright position. Breathe out normally. Place the mouthpiece in your mouth and seal your lips tightly around it. Breathe in slowly and as deeply as possible, raising the piston or the ball toward the top of the column. Hold your breath for 3-5 seconds or for as long as possible. Allow the piston or ball to fall to the bottom of the column. Remove the mouthpiece from your mouth and breathe out normally. Rest for a few seconds and repeat Steps 1 through 7 at least 10 times every 1-2 hours when you are awake. Take your time and take a few normal breaths between deep breaths. The spirometer may include an indicator to show your best effort. Use the indicator as a goal to work toward during each repetition. After  each set of 10 deep breaths, practice coughing to be sure your lungs are clear. If you have an incision (the cut made at the time of surgery), support your incision when coughing by placing a pillow or rolled up towels firmly against it. Once you are able to get out of bed, walk around indoors and cough well. You may stop using the incentive spirometer when instructed by your caregiver.  RISKS AND COMPLICATIONS Take your time so you do not get dizzy or light-headed. If you are in pain, you may need to take or ask for pain medication before doing incentive spirometry. It is harder to take a deep breath if you are having pain. AFTER USE Rest and breathe slowly  and easily. It can be helpful to keep track of a log of your progress. Your caregiver can provide you with a simple table to help with this. If you are using the spirometer at home, follow these instructions: SEEK MEDICAL CARE IF:  You are having difficultly using the spirometer. You have trouble using the spirometer as often as instructed. Your pain medication is not giving enough relief while using the spirometer. You develop fever of 100.5 F (38.1 C) or higher. SEEK IMMEDIATE MEDICAL CARE IF:  You cough up bloody sputum that had not been present before. You develop fever of 102 F (38.9 C) or greater. You develop worsening pain at or near the incision site. MAKE SURE YOU:  Understand these instructions. Will watch your condition. Will get help right away if you are not doing well or get worse. Document Released: 06/26/2006 Document Revised: 05/08/2011 Document Reviewed: 08/27/2006 Exit[employer] Patient Information 2014 Exit[employer], [place].   ________________________________________________________________________ WHAT IS A BLOOD TRANSFUSION? Blood Transfusion Information  A transfusion is the replacement of blood or some of its parts. Blood is made up of multiple cells which provide different functions. Red blood cells carry oxygen and  are used for blood loss replacement. White blood cells fight against infection. Platelets control bleeding. Plasma helps clot blood. Other blood products are available for specialized needs, such as hemophilia or other clotting disorders. BEFORE THE TRANSFUSION  Who gives blood for transfusions?  Healthy volunteers who are fully evaluated to make sure their blood is safe. This is blood bank blood. Transfusion therapy is the safest it has ever been in the practice of medicine. Before blood is taken from a donor, a complete history is taken to make sure that person has no history of diseases nor engages in risky social behavior (examples are intravenous drug use or sexual activity with multiple partners). The donor's travel history is screened to minimize risk of transmitting infections, such as malaria. The donated blood is tested for signs of infectious diseases, such as HIV and hepatitis. The blood is then tested to be sure it is compatible with you in order to minimize the chance of a transfusion reaction. If you or a relative donates blood, this is often done in anticipation of surgery and is not appropriate for emergency situations. It takes many days to process the donated blood. RISKS AND COMPLICATIONS Although transfusion therapy is very safe and saves many lives, the main dangers of transfusion include:  Getting an infectious disease. Developing a transfusion reaction. This is an allergic reaction to something in the blood you were given. Every precaution is taken to prevent this. The decision to have a blood transfusion has been considered carefully by your caregiver before blood is given. Blood is not given unless the benefits outweigh the risks. AFTER THE TRANSFUSION Right after receiving a blood transfusion, you will usually feel much better and more energetic. This is especially true if your red blood cells have gotten low (anemic). The transfusion raises the level of the red blood cells  which carry oxygen, and this usually causes an energy increase. The nurse administering the transfusion will monitor you carefully for complications. HOME CARE INSTRUCTIONS  No special instructions are needed after a transfusion. You may find your energy is better. Speak with your caregiver about any limitations on activity for underlying diseases you may have. SEEK MEDICAL CARE IF:  Your condition is not improving after your transfusion. You develop redness or irritation at the intravenous (IV) site. SEEK IMMEDIATE MEDICAL  CARE IF:  Any of the following symptoms occur over the next 12 hours: Shaking chills. You have a temperature by mouth above 102 F (38.9 C), not controlled by medicine. Chest, back, or muscle pain. People around you feel you are not acting correctly or are confused. Shortness of breath or difficulty breathing. Dizziness and fainting. You get a rash or develop hives. You have a decrease in urine output. Your urine turns a dark color or changes to pink, red, or brown. Any of the following symptoms occur over the next 10 days: You have a temperature by mouth above 102 F (38.9 C), not controlled by medicine. Shortness of breath. Weakness after normal activity. The white part of the eye turns yellow (jaundice). You have a decrease in the amount of urine or are urinating less often. Your urine turns a dark color or changes to pink, red, or brown. Document Released: 02/11/2000 Document Revised: 05/08/2011 Document Reviewed: 09/30/2007 [facility] Patient Information 2014 [place], [place].  _______________________________________________________________________

## 2022-07-17 ENCOUNTER — Encounter (HOSPITAL_COMMUNITY)
Admission: RE | Admit: 2022-07-17 | Discharge: 2022-07-17 | Disposition: A | Discharge status: Home / Self Care | Payer: Medicare Other | Source: Ambulatory Visit | Attending: Orthopaedic Surgery | Admitting: Orthopaedic Surgery

## 2022-07-17 ENCOUNTER — Other Ambulatory Visit: Payer: Self-pay | Admitting: Adult Health

## 2022-07-17 ENCOUNTER — Other Ambulatory Visit: Payer: Self-pay

## 2022-07-17 ENCOUNTER — Encounter (HOSPITAL_COMMUNITY): Payer: Self-pay

## 2022-07-17 VITALS — BP 135/98 | HR 75 | Temp 98.2°F | Resp 20 | Ht 72.0 in | Wt 298.2 lb

## 2022-07-17 DIAGNOSIS — M1611 Unilateral primary osteoarthritis, right hip: Secondary | ICD-10-CM | POA: Insufficient documentation

## 2022-07-17 DIAGNOSIS — I10 Essential (primary) hypertension: Secondary | ICD-10-CM | POA: Insufficient documentation

## 2022-07-17 DIAGNOSIS — E119 Type 2 diabetes mellitus without complications: Secondary | ICD-10-CM

## 2022-07-17 DIAGNOSIS — Z01818 Encounter for other preprocedural examination: Secondary | ICD-10-CM | POA: Insufficient documentation

## 2022-07-17 HISTORY — DX: Type 2 diabetes mellitus without complications: E11.9

## 2022-07-17 HISTORY — DX: Unspecified osteoarthritis, unspecified site: M19.90

## 2022-07-17 LAB — COMPREHENSIVE METABOLIC PANEL
ALT: 29 U/L (ref 0–44)
AST: 22 U/L (ref 15–41)
Albumin: 4.2 g/dL (ref 3.5–5.0)
Alkaline Phosphatase: 42 U/L (ref 38–126)
Anion gap: 11 (ref 5–15)
BUN: 26 mg/dL — ABNORMAL HIGH (ref 8–23)
CO2: 26 mmol/L (ref 22–32)
Calcium: 9.3 mg/dL (ref 8.9–10.3)
Chloride: 101 mmol/L (ref 98–111)
Creatinine, Ser: 0.99 mg/dL (ref 0.61–1.24)
GFR, Estimated: >60 mL/min (ref >60)
Glucose, Bld: 111 mg/dL — ABNORMAL HIGH (ref 70–99)
Potassium: 3.7 mmol/L (ref 3.5–5.1)
Sodium: 138 mmol/L (ref 135–145)
Total Bilirubin: 0.6 mg/dL (ref 0.3–1.2)
Total Protein: 7.4 g/dL (ref 6.5–8.1)

## 2022-07-17 LAB — CBC
HCT: 43.9 % (ref 39.0–52.0)
Hemoglobin: 14.9 g/dL (ref 13.0–17.0)
MCH: 31.4 pg (ref 26.0–34.0)
MCHC: 33.9 g/dL (ref 30.0–36.0)
MCV: 92.6 fL (ref 80.0–100.0)
Platelets: 194 10*3/uL (ref 150–400)
RBC: 4.74 MIL/uL (ref 4.22–5.81)
RDW: 12.4 % (ref 11.5–15.5)
WBC: 8 10*3/uL (ref 4.0–10.5)
nRBC: 0 % (ref 0.0–0.2)

## 2022-07-17 LAB — GLUCOSE, CAPILLARY: Glucose-Capillary: 110 mg/dL — ABNORMAL HIGH (ref 70–99)

## 2022-07-17 LAB — TYPE AND SCREEN
ABO/RH(D): A POS
Antibody Screen: NEGATIVE

## 2022-07-17 LAB — SURGICAL PCR SCREEN
MRSA, PCR: NEGATIVE
Staphylococcus aureus: NEGATIVE

## 2022-07-20 ENCOUNTER — Encounter (HOSPITAL_COMMUNITY): Payer: Self-pay

## 2022-07-20 NOTE — Progress Notes (Signed)
Case: [id number] Date/Time: 07/28/22 1245   Procedure: RIGHT TOTAL HIP ARTHROPLASTY ANTERIOR APPROACH (Right: Hip)   Anesthesia type: Spinal   Pre-op diagnosis: right hip osteoarthritis   Location: [facility] ROOM 09 / [facility]   Surgeons: Kathryne Hitch, MD       DISCUSSION: Tommy Jackson is a 67 yo male who presents to PAT prior to surgery listed above. PMH of HTN, obesity, OSA (on CPAP), asthma, DM, chronic back pain.  Patient last saw his PCP on 06/16/22 for his chronic medical conditions which were all stable.   Has seen Cardiology remotely (last seen 2015) for cardiac w/u for SOB. Had an echo which showed moderate LVH. Had stress testing done which was normal. Has not needed follow up since then.  VS: BP (!) 135/98   Pulse 75   Temp 36.8 C (Oral)   Resp 20   Ht 6' (1.829 m)   Wt 135.3 kg   SpO2 96%   BMI 40.44 kg/m   PROVIDERS: PCP - [name], NP Cardiologist -  Earney Hamburg, MD (Last OV 2013) Pulmonologist - Cyril Mourning, MD   LABS: Labs reviewed: Acceptable for surgery. (all labs ordered are listed, but only abnormal results are displayed)  Labs Reviewed  COMPREHENSIVE METABOLIC PANEL - Abnormal; Notable for the following components:      Result Value   Glucose, Bld 111 (*)    BUN 26 (*)    All other components within normal limits  GLUCOSE, CAPILLARY - Abnormal; Notable for the following components:   Glucose-Capillary 110 (*)    All other components within normal limits  SURGICAL PCR SCREEN  CBC  TYPE AND SCREEN     IMAGES: n/a   EKG 07/17/22:  Normal sinus rhythm   CV:  Echo 08/15/2016:  Study Conclusions   - Left ventricle: The cavity size was normal. Wall thickness was    increased in a pattern of moderate LVH. Systolic function was    normal. The estimated ejection fraction was in the range of 60%    to 65%. Wall motion was normal; there were no regional wall    motion abnormalities. Features are consistent with a  pseudonormal    left ventricular filling pattern, with concomitant abnormal    relaxation and increased filling pressure (grade 2 diastolic    dysfunction).  - Right atrium: The atrium was mildly dilated.   Lexiscan myoview 12/26/11:  Impression  Exercise Capacity: Lexiscan with no exercise.  BP Response: Hypertensive blood pressure response.  Clinical Symptoms: There is dyspnea.  ECG Impression: No significant ST segment change suggestive of ischemia.  Comparison with Prior Nuclear Study: No previous nuclear study performed  Overall Impression: Normal stress nuclear study with a small, mild, fixed inferior defect consistent with diaphragmatic attenuation; no ischemia.  LV Ejection Fraction: 56%. LV Wall Motion: NL LV Function; NL Wall Motion   Past Medical History:  Diagnosis Date   Allergy    Arthritis    ASTHMA 09/07/2009   Diabetes mellitus without complication (HCC)    Herniated disc    HYPERLIPIDEMIA 02/07/2007   HYPERTENSION 11/29/2005   OBESITY 10/01/2007   Sleep apnea    cpap   SLEEP APNEA, OBSTRUCTIVE 10/01/2007    Past Surgical History:  Procedure Laterality Date   bicep surgery Right 2014   COLONOSCOPY     ELBOW SURGERY Right 2013   POLYPECTOMY     removal disc neck  2015   SHOULDER SURGERY Right 1990  MEDICATIONS:  albuterol (VENTOLIN HFA) 108 (90 Base) MCG/ACT inhaler   amLODipine (NORVASC) 10 MG tablet   Ascorbic Acid (VITAMIN C) 100 MG tablet   azelastine (ASTELIN) 0.1 % nasal spray   cetirizine (ZYRTEC) 10 MG tablet   fenofibrate (TRICOR) 145 MG tablet   fluticasone (FLONASE) 50 MCG/ACT nasal spray   furosemide (LASIX) 20 MG tablet   HYDROcodone-acetaminophen (NORCO) 5-325 MG tablet   meloxicam (MOBIC) 15 MG tablet   metoprolol tartrate (LOPRESSOR) 100 MG tablet   Multiple Vitamin (MULTIVITAMIN) capsule   potassium chloride (KLOR-CON M) 10 MEQ tablet   pravastatin (PRAVACHOL) 40 MG tablet   TRULICITY 1.5 MG/0.5ML SOPN    valsartan-hydrochlorothiazide (DIOVAN-HCT) 320-25 MG tablet   No current facility-administered medications for this encounter.   Marcille Blanco [facility] Short Stay/Anesthesiology [facility] Phone [phone] 07/20/2022 2:42 PM

## 2022-07-20 NOTE — Anesthesia Preprocedure Evaluation (Addendum)
Anesthesia Evaluation  Patient identified by MRN, date of birth, ID band Patient awake    Reviewed: Allergy & Precautions, NPO status , Patient's Chart, lab work & pertinent test results  Airway Mallampati: II  TM Distance: >3 FB Neck ROM: Full    Dental  (+) Chipped,    Pulmonary asthma , sleep apnea    Pulmonary exam normal        Cardiovascular hypertension, Pt. on medications and Pt. on home beta blockers Normal cardiovascular exam     Neuro/Psych  Neuromuscular disease  negative psych ROS   GI/Hepatic negative GI ROS, Neg liver ROS,,,  Endo/Other  diabetes  Morbid obesityPatient on GLP-1 Agonist  Renal/GU negative Renal ROS     Musculoskeletal  (+) Arthritis , Osteoarthritis,    Abdominal  (+) + obese  Peds  Hematology negative hematology ROS (+)   Anesthesia Other Findings right hip osteoarthritis  Reproductive/Obstetrics                             Anesthesia Physical Anesthesia Plan  ASA: 3  Anesthesia Plan: Spinal   Post-op Pain Management:    Induction: Intravenous  PONV Risk Score and Plan: 1 and Ondansetron, Dexamethasone, Propofol infusion, Midazolam and Treatment may vary due to age or medical condition  Airway Management Planned: Simple Face Mask  Additional Equipment:   Intra-op Plan:   Post-operative Plan:   Informed Consent: I have reviewed the patients History and Physical, chart, labs and discussed the procedure including the risks, benefits and alternatives for the proposed anesthesia with the patient or authorized representative who has indicated his/her understanding and acceptance.     Dental advisory given  Plan Discussed with: CRNA  Anesthesia Plan Comments: (PAT note from 5/20 by Sherlie Ban PA-C )        Anesthesia Quick Evaluation

## 2022-07-27 DIAGNOSIS — M1611 Unilateral primary osteoarthritis, right hip: Secondary | ICD-10-CM | POA: Insufficient documentation

## 2022-07-27 NOTE — H&P (Signed)
TOTAL HIP ADMISSION H&P  Patient is admitted for right total hip arthroplasty.  Subjective:  Chief Complaint: right hip pain  HPI: Tommy Jackson, 67 y.o. male, has a history of pain and functional disability in the right hip(s) due to arthritis and patient has failed non-surgical conservative treatments for greater than 12 weeks to include NSAID's and/or analgesics, corticosteriod injections, flexibility and strengthening excercises, weight reduction as appropriate, and activity modification.  Onset of symptoms was gradual starting 2 years ago with rapidlly worsening course since that time.The patient noted no past surgery on the right hip(s).  Patient currently rates pain in the right hip at 10 out of 10 with activity. Patient has night pain, worsening of pain with activity and weight bearing, trendelenberg gait, pain that interfers with activities of daily living, and pain with passive range of motion. Patient has evidence of subchondral sclerosis, periarticular osteophytes, and joint space narrowing by imaging studies. This condition presents safety issues increasing the risk of falls.  There is no current active infection.  Patient Active Problem List   Diagnosis Date Noted   Unilateral primary osteoarthritis, right hip 07/27/2022   Prediabetes 02/18/2015   Left leg pain 09/01/2013   Left knee pain 09/01/2013   Left hip pain 08/04/2013   Allergic rhinitis 04/09/2013   Other abnormal glucose 10/24/2012   Dermatitis 05/07/2012   Cervical radiculopathy 02/22/2012   HERNIATED DISC 05/11/2010   Asthma 09/07/2009   Obesity 10/01/2007   Obstructive sleep apnea 10/01/2007   Dyslipidemia 02/07/2007   Essential hypertension 11/29/2005   Past Medical History:  Diagnosis Date   Allergy    Arthritis    ASTHMA 09/07/2009   Diabetes mellitus without complication (HCC)    Herniated disc    HYPERLIPIDEMIA 02/07/2007   HYPERTENSION 11/29/2005   OBESITY 10/01/2007   Sleep apnea    cpap    SLEEP APNEA, OBSTRUCTIVE 10/01/2007    Past Surgical History:  Procedure Laterality Date   bicep surgery Right 2014   COLONOSCOPY     ELBOW SURGERY Right 2013   POLYPECTOMY     removal disc neck  2015   SHOULDER SURGERY Right 1990    No current facility-administered medications for this encounter.   Current Outpatient Medications  Medication Sig Dispense Refill Last Dose   albuterol (VENTOLIN HFA) 108 (90 Base) MCG/ACT inhaler Inhale 2 puffs into the lungs every 4 (four) hours as needed. 6.7 g 3    Ascorbic Acid (VITAMIN C) 100 MG tablet Take 500 mg by mouth daily.      azelastine (ASTELIN) 0.1 % nasal spray Place 2 sprays into both nostrils daily as needed for allergies.      cetirizine (ZYRTEC) 10 MG tablet Take 10 mg by mouth daily as needed for allergies.      fenofibrate (TRICOR) 145 MG tablet Take 1 tablet (145 mg total) by mouth daily. 90 tablet 3    fluticasone (FLONASE) 50 MCG/ACT nasal spray Place 2 sprays into both nostrils daily as needed for allergies.      furosemide (LASIX) 20 MG tablet Take 1 tablet (20 mg total) by mouth daily. 90 tablet 3    HYDROcodone-acetaminophen (NORCO) 5-325 MG tablet Take 1-2 tablets by mouth every 6 (six) hours as needed for moderate pain. 30 tablet 0    meloxicam (MOBIC) 15 MG tablet Take 1 tablet by mouth daily.      metoprolol tartrate (LOPRESSOR) 100 MG tablet Take 1 tablet (100 mg total) by mouth 2 (two) times daily.  180 tablet 3    Multiple Vitamin (MULTIVITAMIN) capsule Take 1 capsule by mouth daily.      potassium chloride (KLOR-CON M) 10 MEQ tablet Take 1 tablet (10 mEq total) by mouth daily. 90 tablet 3    pravastatin (PRAVACHOL) 40 MG tablet Take 1 tablet (40 mg total) by mouth daily. 90 tablet 3    TRULICITY 1.5 MG/0.5ML SOPN INJECT 1.5 MG (0.5ML) UNDER THE SKIN ONCE A WEEK (Patient taking differently: Inject 1.5 mg into the skin once a week.) 8 mL 0    valsartan-hydrochlorothiazide (DIOVAN-HCT) 320-25 MG tablet Take 1 tablet by  mouth daily. 90 tablet 3    amLODipine (NORVASC) 10 MG tablet TAKE 1 TABLET BY MOUTH EVERY DAY 90 tablet 0    Allergies  Allergen Reactions   Aspirin     REACTION: unspecified   Glycerin Nausea Only   Penicillins     REACTION: family hx   Metformin And Related Diarrhea    Social History   Tobacco Use   Smoking status: Never   Smokeless tobacco: Never  Substance Use Topics   Alcohol use: Yes    Alcohol/week: 0.0 standard drinks of alcohol    Comment: rare alcohol intake    Family History  Problem Relation Age of Onset   Colon cancer Father 46   Lung cancer Father    CAD Father    Diabetes Father    Heart attack Father    Colon cancer Brother 69   Heart attack Paternal Grandfather    CAD Brother    Esophageal cancer Neg Hx    Stomach cancer Neg Hx    Rectal cancer Neg Hx      Review of Systems  Objective:  Physical Exam Vitals reviewed.  Constitutional:      Appearance: Normal appearance. He is obese.  HENT:     Head: Normocephalic and atraumatic.  Eyes:     Extraocular Movements: Extraocular movements intact.     Pupils: Pupils are equal, round, and reactive to light.  Cardiovascular:     Rate and Rhythm: Normal rate and regular rhythm.     Pulses: Normal pulses.  Pulmonary:     Effort: Pulmonary effort is normal.     Breath sounds: Normal breath sounds.  Abdominal:     Palpations: Abdomen is soft.  Musculoskeletal:     Cervical back: Normal range of motion and neck supple.     Right hip: Tenderness and bony tenderness present. Decreased range of motion. Decreased strength.  Neurological:     Mental Status: He is alert and oriented to person, place, and time.  Psychiatric:        Behavior: Behavior normal.     Vital signs in last 24 hours:    Labs:   Estimated body mass index is 40.44 kg/m as calculated from the following:   Height as of 07/17/22: 6' (1.829 m).   Weight as of 07/17/22: 135.3 kg.   Imaging Review Plain radiographs  demonstrate severe degenerative joint disease of the right hip(s). The bone quality appears to be good for age and reported activity level.      Assessment/Plan:  End stage arthritis, right hip(s)  The patient history, physical examination, clinical judgement of the provider and imaging studies are consistent with end stage degenerative joint disease of the right hip(s) and total hip arthroplasty is deemed medically necessary. The treatment options including medical management, injection therapy, arthroscopy and arthroplasty were discussed at length. The risks and benefits  of total hip arthroplasty were presented and reviewed. The risks due to aseptic loosening, infection, stiffness, dislocation/subluxation,  thromboembolic complications and other imponderables were discussed.  The patient acknowledged the explanation, agreed to proceed with the plan and consent was signed. Patient is being admitted for inpatient treatment for surgery, pain control, PT, OT, prophylactic antibiotics, VTE prophylaxis, progressive ambulation and ADL's and discharge planning.The patient is planning to be discharged home with home health services

## 2022-07-28 ENCOUNTER — Encounter (HOSPITAL_COMMUNITY)
Admission: RE | Disposition: A | Discharge status: Home / Self Care | Payer: Self-pay | Source: Home / Self Care | Attending: Orthopaedic Surgery

## 2022-07-28 ENCOUNTER — Observation Stay (HOSPITAL_COMMUNITY)
Admission: RE | Admit: 2022-07-28 | Discharge: 2022-07-29 | Disposition: A | Discharge status: Home / Self Care | Payer: Medicare Other | Attending: Orthopaedic Surgery | Admitting: Orthopaedic Surgery

## 2022-07-28 ENCOUNTER — Encounter (HOSPITAL_COMMUNITY): Payer: Self-pay | Admitting: Orthopaedic Surgery

## 2022-07-28 ENCOUNTER — Other Ambulatory Visit: Payer: Self-pay

## 2022-07-28 ENCOUNTER — Ambulatory Visit (HOSPITAL_COMMUNITY): Payer: Medicare Other

## 2022-07-28 ENCOUNTER — Ambulatory Visit (HOSPITAL_COMMUNITY): Payer: Medicare Other | Admitting: Anesthesiology

## 2022-07-28 ENCOUNTER — Ambulatory Visit (HOSPITAL_COMMUNITY): Payer: Medicare Other | Admitting: Physician Assistant

## 2022-07-28 DIAGNOSIS — I10 Essential (primary) hypertension: Secondary | ICD-10-CM | POA: Insufficient documentation

## 2022-07-28 DIAGNOSIS — E1149 Type 2 diabetes mellitus with other diabetic neurological complication: Secondary | ICD-10-CM

## 2022-07-28 DIAGNOSIS — Z01818 Encounter for other preprocedural examination: Secondary | ICD-10-CM

## 2022-07-28 DIAGNOSIS — Z471 Aftercare following joint replacement surgery: Secondary | ICD-10-CM | POA: Diagnosis not present

## 2022-07-28 DIAGNOSIS — Z96641 Presence of right artificial hip joint: Secondary | ICD-10-CM | POA: Diagnosis not present

## 2022-07-28 DIAGNOSIS — J45909 Unspecified asthma, uncomplicated: Secondary | ICD-10-CM

## 2022-07-28 DIAGNOSIS — M1611 Unilateral primary osteoarthritis, right hip: Secondary | ICD-10-CM | POA: Diagnosis not present

## 2022-07-28 DIAGNOSIS — M1612 Unilateral primary osteoarthritis, left hip: Secondary | ICD-10-CM | POA: Diagnosis not present

## 2022-07-28 DIAGNOSIS — E119 Type 2 diabetes mellitus without complications: Secondary | ICD-10-CM | POA: Diagnosis not present

## 2022-07-28 DIAGNOSIS — Z79899 Other long term (current) drug therapy: Secondary | ICD-10-CM | POA: Diagnosis not present

## 2022-07-28 HISTORY — PX: TOTAL HIP ARTHROPLASTY: SHX124

## 2022-07-28 LAB — ABO/RH: ABO/RH(D): A POS

## 2022-07-28 LAB — GLUCOSE, CAPILLARY
Glucose-Capillary: 143 mg/dL — ABNORMAL HIGH (ref 70–99)
Glucose-Capillary: 102 mg/dL — ABNORMAL HIGH (ref 70–99)

## 2022-07-28 SURGERY — ARTHROPLASTY, HIP, TOTAL, ANTERIOR APPROACH
Anesthesia: Spinal | Site: Hip | Laterality: Right

## 2022-07-28 MED ORDER — LACTATED RINGERS IV SOLN: INTRAVENOUS | Status: DC

## 2022-07-28 MED ORDER — PHENYLEPHRINE 80 MCG/ML (10ML) SYRINGE FOR IV PUSH (FOR BLOOD PRESSURE SUPPORT)
PREFILLED_SYRINGE | INTRAVENOUS | Status: DC | PRN
  Administered 2022-07-28 (×6): 160 ug via INTRAVENOUS

## 2022-07-28 MED ORDER — OXYCODONE HCL 5 MG PO TABS
5.0000 mg | ORAL_TABLET | ORAL | Status: DC | PRN
  Administered 2022-07-28 – 2022-07-29 (×2): 10 mg via ORAL
  Filled 2022-07-28 (×3): qty 2

## 2022-07-28 MED ORDER — PHENYLEPHRINE HCL (PRESSORS) 10 MG/ML IV SOLN
INTRAVENOUS | Status: AC
  Filled 2022-07-28: qty 1

## 2022-07-28 MED ORDER — BUPIVACAINE IN DEXTROSE 0.75-8.25 % IT SOLN
INTRATHECAL | Status: DC | PRN
  Administered 2022-07-28: 2 mL via INTRATHECAL

## 2022-07-28 MED ORDER — CEFAZOLIN SODIUM-DEXTROSE 2-4 GM/100ML-% IV SOLN
2.0000 g | INTRAVENOUS | Status: AC
  Administered 2022-07-28: 3 g via INTRAVENOUS
  Filled 2022-07-28: qty 100

## 2022-07-28 MED ORDER — PROPOFOL 10 MG/ML IV BOLUS
INTRAVENOUS | Status: DC | PRN
  Administered 2022-07-28: 40 mg via INTRAVENOUS

## 2022-07-28 MED ORDER — FENTANYL CITRATE PF 50 MCG/ML IJ SOSY: 25.0000 ug | PREFILLED_SYRINGE | INTRAMUSCULAR | Status: DC | PRN

## 2022-07-28 MED ORDER — ORAL CARE MOUTH RINSE: 15.0000 mL | Freq: Once | OROMUCOSAL | Status: AC

## 2022-07-28 MED ORDER — DIPHENHYDRAMINE HCL 12.5 MG/5ML PO ELIX: 12.5000 mg | ORAL_SOLUTION | ORAL | Status: DC | PRN

## 2022-07-28 MED ORDER — METHOCARBAMOL 500 MG IVPB - SIMPLE MED: 500.0000 mg | Freq: Four times a day (QID) | INTRAVENOUS | Status: DC | PRN

## 2022-07-28 MED ORDER — MIDAZOLAM HCL 2 MG/2ML IJ SOLN
INTRAMUSCULAR | Status: AC
  Filled 2022-07-28: qty 2

## 2022-07-28 MED ORDER — HYDROCHLOROTHIAZIDE 25 MG PO TABS
25.0000 mg | ORAL_TABLET | Freq: Every day | ORAL | Status: DC
  Administered 2022-07-29: 25 mg via ORAL
  Filled 2022-07-28: qty 1

## 2022-07-28 MED ORDER — AMISULPRIDE (ANTIEMETIC) 5 MG/2ML IV SOLN: 10.0000 mg | Freq: Once | INTRAVENOUS | Status: DC | PRN

## 2022-07-28 MED ORDER — DEXAMETHASONE SODIUM PHOSPHATE 10 MG/ML IJ SOLN
INTRAMUSCULAR | Status: DC | PRN
  Administered 2022-07-28: 10 mg via INTRAVENOUS

## 2022-07-28 MED ORDER — DEXMEDETOMIDINE HCL IN NACL 80 MCG/20ML IV SOLN
INTRAVENOUS | Status: DC | PRN
  Administered 2022-07-28: 12 ug via INTRAVENOUS

## 2022-07-28 MED ORDER — ASPIRIN 81 MG PO CHEW
81.0000 mg | CHEWABLE_TABLET | Freq: Two times a day (BID) | ORAL | Status: DC
  Administered 2022-07-28 – 2022-07-29 (×2): 81 mg via ORAL
  Filled 2022-07-28 (×2): qty 1

## 2022-07-28 MED ORDER — ONDANSETRON HCL 4 MG/2ML IJ SOLN: 4.0000 mg | Freq: Once | INTRAMUSCULAR | Status: DC | PRN

## 2022-07-28 MED ORDER — DOCUSATE SODIUM 100 MG PO CAPS
100.0000 mg | ORAL_CAPSULE | Freq: Two times a day (BID) | ORAL | Status: DC
  Administered 2022-07-28 – 2022-07-29 (×2): 100 mg via ORAL
  Filled 2022-07-28 (×2): qty 1

## 2022-07-28 MED ORDER — DEXAMETHASONE SODIUM PHOSPHATE 10 MG/ML IJ SOLN
INTRAMUSCULAR | Status: AC
  Filled 2022-07-28: qty 1

## 2022-07-28 MED ORDER — METOPROLOL TARTRATE 50 MG PO TABS
100.0000 mg | ORAL_TABLET | Freq: Two times a day (BID) | ORAL | Status: DC
  Administered 2022-07-28 – 2022-07-29 (×2): 100 mg via ORAL
  Filled 2022-07-28 (×2): qty 2

## 2022-07-28 MED ORDER — OXYCODONE HCL 5 MG PO TABS
10.0000 mg | ORAL_TABLET | ORAL | Status: DC | PRN
  Administered 2022-07-28: 10 mg via ORAL

## 2022-07-28 MED ORDER — ALBUTEROL SULFATE HFA 108 (90 BASE) MCG/ACT IN AERS: 2.0000 | INHALATION_SPRAY | RESPIRATORY_TRACT | Status: DC | PRN

## 2022-07-28 MED ORDER — POTASSIUM CHLORIDE CRYS ER 10 MEQ PO TBCR
10.0000 meq | EXTENDED_RELEASE_TABLET | Freq: Every day | ORAL | Status: DC
  Administered 2022-07-29: 10 meq via ORAL
  Filled 2022-07-28: qty 1

## 2022-07-28 MED ORDER — POVIDONE-IODINE 10 % EX SWAB: 2.0000 | Freq: Once | CUTANEOUS | Status: DC

## 2022-07-28 MED ORDER — PRAVASTATIN SODIUM 20 MG PO TABS
40.0000 mg | ORAL_TABLET | Freq: Every day | ORAL | Status: DC
  Administered 2022-07-29: 40 mg via ORAL
  Filled 2022-07-28: qty 2

## 2022-07-28 MED ORDER — TRANEXAMIC ACID-NACL 1000-0.7 MG/100ML-% IV SOLN
1000.0000 mg | INTRAVENOUS | Status: AC
  Administered 2022-07-28: 1000 mg via INTRAVENOUS
  Filled 2022-07-28: qty 100

## 2022-07-28 MED ORDER — PHENYLEPHRINE HCL-NACL 20-0.9 MG/250ML-% IV SOLN
INTRAVENOUS | Status: DC | PRN
  Administered 2022-07-28: 40 ug/min via INTRAVENOUS

## 2022-07-28 MED ORDER — CHLORHEXIDINE GLUCONATE 0.12 % MT SOLN
15.0000 mL | Freq: Once | OROMUCOSAL | Status: AC
  Administered 2022-07-28: 15 mL via OROMUCOSAL

## 2022-07-28 MED ORDER — MENTHOL 3 MG MT LOZG: 1.0000 | LOZENGE | OROMUCOSAL | Status: DC | PRN

## 2022-07-28 MED ORDER — DEXMEDETOMIDINE HCL IN NACL 80 MCG/20ML IV SOLN
INTRAVENOUS | Status: AC
  Filled 2022-07-28: qty 20

## 2022-07-28 MED ORDER — FUROSEMIDE 20 MG PO TABS
20.0000 mg | ORAL_TABLET | Freq: Every day | ORAL | Status: DC
  Administered 2022-07-29: 20 mg via ORAL
  Filled 2022-07-28: qty 1

## 2022-07-28 MED ORDER — SODIUM CHLORIDE 0.9 % IV SOLN: INTRAVENOUS | Status: DC

## 2022-07-28 MED ORDER — IRBESARTAN 150 MG PO TABS
300.0000 mg | ORAL_TABLET | Freq: Every day | ORAL | Status: DC
  Administered 2022-07-29: 300 mg via ORAL
  Filled 2022-07-28: qty 2

## 2022-07-28 MED ORDER — VITAMIN C 500 MG PO TABS
500.0000 mg | ORAL_TABLET | Freq: Every day | ORAL | Status: DC
  Administered 2022-07-29: 500 mg via ORAL
  Filled 2022-07-28: qty 1

## 2022-07-28 MED ORDER — PROPOFOL 500 MG/50ML IV EMUL
INTRAVENOUS | Status: DC | PRN
  Administered 2022-07-28: 100 ug/kg/min via INTRAVENOUS

## 2022-07-28 MED ORDER — SODIUM CHLORIDE 0.9 % IR SOLN
Status: DC | PRN
  Administered 2022-07-28: 1000 mL

## 2022-07-28 MED ORDER — ACETAMINOPHEN 325 MG PO TABS: 325.0000 mg | ORAL_TABLET | Freq: Four times a day (QID) | ORAL | Status: DC | PRN

## 2022-07-28 MED ORDER — ALBUTEROL SULFATE (2.5 MG/3ML) 0.083% IN NEBU: 2.5000 mg | INHALATION_SOLUTION | RESPIRATORY_TRACT | Status: DC | PRN

## 2022-07-28 MED ORDER — METOCLOPRAMIDE HCL 5 MG/ML IJ SOLN
5.0000 mg | Freq: Three times a day (TID) | INTRAMUSCULAR | Status: DC | PRN
  Administered 2022-07-28: 5 mg via INTRAVENOUS
  Filled 2022-07-28: qty 2

## 2022-07-28 MED ORDER — PHENYLEPHRINE 80 MCG/ML (10ML) SYRINGE FOR IV PUSH (FOR BLOOD PRESSURE SUPPORT)
PREFILLED_SYRINGE | INTRAVENOUS | Status: AC
  Filled 2022-07-28: qty 10

## 2022-07-28 MED ORDER — CEFAZOLIN SODIUM-DEXTROSE 2-4 GM/100ML-% IV SOLN
2.0000 g | Freq: Four times a day (QID) | INTRAVENOUS | Status: AC
  Administered 2022-07-28 – 2022-07-29 (×2): 2 g via INTRAVENOUS
  Filled 2022-07-28 (×2): qty 100

## 2022-07-28 MED ORDER — CEFAZOLIN SODIUM-DEXTROSE 2-4 GM/100ML-% IV SOLN
INTRAVENOUS | Status: AC
  Filled 2022-07-28: qty 100

## 2022-07-28 MED ORDER — ONDANSETRON HCL 4 MG/2ML IJ SOLN
INTRAMUSCULAR | Status: DC | PRN
  Administered 2022-07-28: 4 mg via INTRAVENOUS

## 2022-07-28 MED ORDER — PHENOL 1.4 % MT LIQD: 1.0000 | OROMUCOSAL | Status: DC | PRN

## 2022-07-28 MED ORDER — CEFAZOLIN SODIUM 1 G IJ SOLR
INTRAMUSCULAR | Status: AC
  Filled 2022-07-28: qty 10

## 2022-07-28 MED ORDER — ONDANSETRON HCL 4 MG PO TABS: 4.0000 mg | ORAL_TABLET | Freq: Four times a day (QID) | ORAL | Status: DC | PRN

## 2022-07-28 MED ORDER — HYDROMORPHONE HCL 1 MG/ML IJ SOLN: 0.5000 mg | INTRAMUSCULAR | Status: DC | PRN

## 2022-07-28 MED ORDER — PANTOPRAZOLE SODIUM 40 MG PO TBEC
40.0000 mg | DELAYED_RELEASE_TABLET | Freq: Every day | ORAL | Status: DC
  Administered 2022-07-29: 40 mg via ORAL
  Filled 2022-07-28: qty 1

## 2022-07-28 MED ORDER — STERILE WATER FOR IRRIGATION IR SOLN
Status: DC | PRN
  Administered 2022-07-28: 2000 mL

## 2022-07-28 MED ORDER — 0.9 % SODIUM CHLORIDE (POUR BTL) OPTIME
TOPICAL | Status: DC | PRN
  Administered 2022-07-28: 1000 mL

## 2022-07-28 MED ORDER — ONDANSETRON HCL 4 MG/2ML IJ SOLN
4.0000 mg | Freq: Four times a day (QID) | INTRAMUSCULAR | Status: DC | PRN
  Administered 2022-07-29: 4 mg via INTRAVENOUS
  Filled 2022-07-28 (×2): qty 2

## 2022-07-28 MED ORDER — FENTANYL CITRATE (PF) 100 MCG/2ML IJ SOLN
INTRAMUSCULAR | Status: AC
  Filled 2022-07-28: qty 2

## 2022-07-28 MED ORDER — PROPOFOL 10 MG/ML IV BOLUS
INTRAVENOUS | Status: AC
  Filled 2022-07-28: qty 20

## 2022-07-28 MED ORDER — METHOCARBAMOL 500 MG PO TABS
500.0000 mg | ORAL_TABLET | Freq: Four times a day (QID) | ORAL | Status: DC | PRN
  Administered 2022-07-28: 500 mg via ORAL
  Filled 2022-07-28: qty 1

## 2022-07-28 MED ORDER — ACETAMINOPHEN 10 MG/ML IV SOLN: 1000.0000 mg | Freq: Once | INTRAVENOUS | Status: DC | PRN

## 2022-07-28 MED ORDER — ALUM & MAG HYDROXIDE-SIMETH 200-200-20 MG/5ML PO SUSP: 30.0000 mL | ORAL | Status: DC | PRN

## 2022-07-28 MED ORDER — METOCLOPRAMIDE HCL 5 MG PO TABS: 5.0000 mg | ORAL_TABLET | Freq: Three times a day (TID) | ORAL | Status: DC | PRN

## 2022-07-28 MED ORDER — ONDANSETRON HCL 4 MG/2ML IJ SOLN
INTRAMUSCULAR | Status: AC
  Filled 2022-07-28: qty 2

## 2022-07-28 MED ORDER — MIDAZOLAM HCL 2 MG/2ML IJ SOLN
INTRAMUSCULAR | Status: DC | PRN
  Administered 2022-07-28: 2 mg via INTRAVENOUS

## 2022-07-28 MED ORDER — VALSARTAN-HYDROCHLOROTHIAZIDE 320-25 MG PO TABS: 1.0000 | ORAL_TABLET | Freq: Every day | ORAL | Status: DC

## 2022-07-28 SURGICAL SUPPLY — 41 items
APL SKNCLS STERI-STRIP NONHPOA (GAUZE/BANDAGES/DRESSINGS)
BAG COUNTER SPONGE SURGICOUNT (BAG) ×1 IMPLANT
BAG SPEC THK2 15X12 ZIP CLS (MISCELLANEOUS)
BAG SPNG CNTER NS LX DISP (BAG) ×1
BAG ZIPLOCK 12X15 (MISCELLANEOUS) IMPLANT
BENZOIN TINCTURE PRP APPL 2/3 (GAUZE/BANDAGES/DRESSINGS) IMPLANT
BLADE SAW SGTL 18X1.27X75 (BLADE) ×1 IMPLANT
COVER PERINEAL POST (MISCELLANEOUS) ×1 IMPLANT
COVER SURGICAL LIGHT HANDLE (MISCELLANEOUS) ×1 IMPLANT
CUP SECTOR GRIPTON 58MM (Orthopedic Implant) IMPLANT
DRAPE FOOT SWITCH (DRAPES) ×1 IMPLANT
DRAPE STERI IOBAN 125X83 (DRAPES) ×1 IMPLANT
DRAPE U-SHAPE 47X51 STRL (DRAPES) ×2 IMPLANT
DRSG AQUACEL AG ADV 3.5X10 (GAUZE/BANDAGES/DRESSINGS) ×1 IMPLANT
DURAPREP 26ML APPLICATOR (WOUND CARE) ×1 IMPLANT
ELECT REM PT RETURN 15FT ADLT (MISCELLANEOUS) ×1 IMPLANT
GAUZE XEROFORM 1X8 LF (GAUZE/BANDAGES/DRESSINGS) ×1 IMPLANT
GLOVE BIO SURGEON STRL SZ7.5 (GLOVE) ×1 IMPLANT
GLOVE BIOGEL PI IND STRL 8 (GLOVE) ×2 IMPLANT
GLOVE ECLIPSE 8.0 STRL XLNG CF (GLOVE) ×1 IMPLANT
GOWN STRL REUS W/ TWL XL LVL3 (GOWN DISPOSABLE) ×2 IMPLANT
GOWN STRL REUS W/TWL XL LVL3 (GOWN DISPOSABLE) ×2
HANDPIECE INTERPULSE COAX TIP (DISPOSABLE) ×1
HEAD CERAMIC 36 PLUS5 (Hips) IMPLANT
HOLDER FOLEY CATH W/STRAP (MISCELLANEOUS) ×1 IMPLANT
KIT TURNOVER KIT A (KITS) IMPLANT
LINER NEUTRAL 36X58 PLUS4 IMPLANT
PACK ANTERIOR HIP CUSTOM (KITS) ×1 IMPLANT
SET HNDPC FAN SPRY TIP SCT (DISPOSABLE) ×1 IMPLANT
STAPLER VISISTAT 35W (STAPLE) IMPLANT
STEM FEMORAL SZ5 HIGH ACTIS (Stem) IMPLANT
STRIP CLOSURE SKIN 1/2X4 (GAUZE/BANDAGES/DRESSINGS) IMPLANT
SUT ETHIBOND NAB CT1 #1 30IN (SUTURE) ×1 IMPLANT
SUT ETHILON 2 0 PS N (SUTURE) IMPLANT
SUT MNCRL AB 4-0 PS2 18 (SUTURE) IMPLANT
SUT VIC AB 0 CT1 36 (SUTURE) ×1 IMPLANT
SUT VIC AB 1 CT1 36 (SUTURE) ×1 IMPLANT
SUT VIC AB 2-0 CT1 27 (SUTURE) ×2
SUT VIC AB 2-0 CT1 TAPERPNT 27 (SUTURE) ×2 IMPLANT
TRAY FOLEY MTR SLVR 16FR STAT (SET/KITS/TRAYS/PACK) IMPLANT
YANKAUER SUCT BULB TIP NO VENT (SUCTIONS) ×1 IMPLANT

## 2022-07-28 NOTE — Progress Notes (Signed)
   07/28/22 2001  BiPAP/CPAP/SIPAP  $ Non-Invasive Ventilator  Non-Invasive Vent Initial  BiPAP/CPAP/SIPAP Pt Type (S)  Adult (Pt prefer self placement, uses Resmed at home, Prefer self placement)  BiPAP/CPAP/SIPAP Resmed  Mask Type Full face mask (pts home mask and tubing)  Respiratory Rate 18 breaths/min  FiO2 (%) 21 %  Auto Titrate (S)  Yes (V auto 20/5 PS 5 Pt said these settings are comfortable)  Safety Check Completed by RT for Home Unit Yes, no issues noted  BiPAP/CPAP /SiPAP Vitals  Pulse Rate 77  Resp 17  SpO2 99 %  Bilateral Breath Sounds Clear  MEWS Score/Color  MEWS Score 0  MEWS Score Color Green   New setup, no issues noted, pt comfortable.

## 2022-07-28 NOTE — Transfer of Care (Signed)
Immediate Anesthesia Transfer of Care Note  Patient: Tommy Jackson  Procedure(s) Performed: RIGHT TOTAL HIP ARTHROPLASTY ANTERIOR APPROACH (Right: Hip)  Patient Location: PACU  Anesthesia Type:Spinal and MAC combined with regional for post-op pain  Level of Consciousness: awake, alert , oriented, and patient cooperative  Airway & Oxygen Therapy: Patient Spontanous Breathing and Patient connected to face mask oxygen  Post-op Assessment: Report given to RN and Post -op Vital signs reviewed and stable  Post vital signs: Reviewed and stable  Last Vitals:  Vitals Value Taken Time  BP 93/62 07/28/22 1602  Temp    Pulse 59 07/28/22 1604  Resp 10 07/28/22 1604  SpO2 97 % 07/28/22 1604  Vitals shown include unvalidated device data.  Last Pain:  Vitals:   07/28/22 1037  TempSrc:   PainSc: 0-No pain         Complications: No notable events documented.

## 2022-07-28 NOTE — Anesthesia Procedure Notes (Signed)
Spinal  Patient location during procedure: OR Start time: 07/28/2022 1:58 PM End time: 07/28/2022 2:03 PM Reason for block: surgical anesthesia Staffing Performed: anesthesiologist  Anesthesiologist: Eilene Ghazi, MD Performed by: Eilene Ghazi, MD Authorized by: Eilene Ghazi, MD   Preanesthetic Checklist Completed: patient identified, IV checked, site marked, risks and benefits discussed, surgical consent, monitors and equipment checked, pre-op evaluation and timeout performed Spinal Block Patient position: sitting Prep: Betadine Patient monitoring: heart rate, continuous pulse ox and blood pressure Approach: midline Location: L3-4 Injection technique: single-shot Needle Needle type: Sprotte  Needle gauge: 24 G Needle length: 9 cm Assessment Sensory level: T6 Events: CSF return Additional Notes

## 2022-07-28 NOTE — Interval H&P Note (Signed)
History and Physical Interval Note: The patient understands that he is here today for a right total hip replacement to treat his severe right hip arthritis.  There has been no acute or interval change in his medical status.  See H&P.  The risks and benefits of surgery been discussed in detail and informed consent has been obtained.  The right operative hip has been marked.  07/28/2022 12:03 PM  Tommy Jackson  has presented today for surgery, with the diagnosis of right hip osteoarthritis.  The various methods of treatment have been discussed with the patient and family. After consideration of risks, benefits and other options for treatment, the patient has consented to  Procedure(s): RIGHT TOTAL HIP ARTHROPLASTY ANTERIOR APPROACH (Right) as a surgical intervention.  The patient's history has been reviewed, patient examined, no change in status, stable for surgery.  I have reviewed the patient's chart and labs.  Questions were answered to the patient's satisfaction.     Kathryne Hitch

## 2022-07-28 NOTE — Op Note (Signed)
Operative Note  Date of operation: 07/28/2022 Operative diagnosis: Right hip severe osteoarthritis Postoperative diagnosis: Same  Procedure: Right direct anterior total hip arthroplasty  Implants: Implant Name Type Inv. Item Serial No. Manufacturer Lot No. LRB No. Used Action  CUP SECTOR GRIPTON - [id number] Orthopedic Implant CUP SECTOR GRIPTON  [employer]PU[employer]TH[employer] [id number] Right 1 Implanted  LINER NEUTRAL 36X58 PLUS4 - [id number]  LINER NEUTRAL 36X58 PLUS4  DEPUY ORTHOPAEDICS [id number] Right 1 Implanted  STEM FEMORAL SZ5 HIGH ACTIS - [id number] Stem STEM FEMORAL SZ5 HIGH ACTIS  DEPUY ORTHOPAEDICS [id number] Right 1 Implanted  HEAD CERAMIC 36 PLUS5 - [id number] Hips HEAD CERAMIC 36 PLUS5  DEPUY ORTHOPAEDICS [id number] Right 1 Implanted   Surgeon: Vanita Panda. Magnus Ivan, MD Assistant: Tomma Lightning, RNFA  Anesthesia: Spinal EBL: 300 cc Antibiotics: 3 g IV Ancef Complications: None  Indications: The patient is a 67 year old gentleman with debilitating arthritis involving his right hip that is been well-documented with clinical exam and x-ray findings.  He has tried and failed conservative treatment for several years now.  At this point his right hip pain is debilitating.  It is on a daily basis and it is detrimentally affecting his mobility, his quality of life and his activities day living.  He has been on a significant weight loss journey as well.  His BMI has come down to 40 and that is much better independent and surprisingly his legs and thighs are small.  He has a large abdomen that is easily mobilizable.  Given his continued weight loss journey combined with the severity of his hip arthritis, I feel comfortable with proceeding with a total hip arthroplasty on the right side.  We did have a long and thorough discussion about the heightened risks given his obesity of acute blood loss anemia, nerve vessel injury, fracture, infection, DVT, dislocation, implant failure, leg length  differences and wound healing issues.  He understands her goals are hopefully decrease pain, improve mobility, and improve quality of life.  Procedure description: After informed consent was obtained and the appropriate right hip was marked, the patient was brought to the operating room and set up on the stretcher where spinal anesthesia was obtained.  He was then laid in supine position on the stretcher and a Foley catheter was placed.  I then assessed his leg length and is definitely shorter on his right operative side than the left.  Traction boots were placed on both his feet and he was placed supine on the Hana fracture table with a perineal post in place in both legs and inline skeletal traction devices but no traction applied.  I then assessed his right and left hip as well as pelvis radiographically.  We were able to taper his abdomen of the way and then prepped and draped the right hip with DuraPrep and sterile drapes.  Timeout was called and he was advised correct patient the correct right hip.  I then made incision just inferior and posterior the ASIS and carried this slightly obliquely down the leg.  Dissection was carried down to the tensor fascia lata muscle and the tensor fascia was then divided longitudinally to proceed with a direct and approach the hip.  Circumflex vessels were identified and cauterized and hip capsule was identified and opened up in L-type format finding a moderate joint effusion.  Cobra retractors were placed around the medial and lateral femoral neck and a femoral neck cut was made with an oscillating saw just proximal to the lesser trochanter and this  Was completed with an osteotome.  A corkscrew guide was placed in the femoral head and the femoral head was removed and there was a wide area devoid of cartilage.  A bent Hohmann was then placed over the medial acetabular rim and remnants of the acetabular labrum and other debris removed.  Reaming was then initiated from a small  reamer going and stepwise increments all the way up to a size 57 reamer with all reamers placed under direct visualization and the last reamer was placed under direct fluoroscopy in order to obtain the depth and reaming, the inclination and the anteversion.  The real DePuy sector GRIPTION acetabular clinic size 58 was then placed without difficulty followed by 36+4 polythene liner.  Attention was then turned to the femur.  With the right leg externally rotated to 120 degrees, extended and adducted, a Mueller retractor was placed medially at the medial calcar and a long bent Hohmann was placed by the greater trochanter.  The lateral joint capsule was released and a box cutting osteotome was used into the femoral canal.  Broaching was then initiated using the Actis broaching system from a size 0 going to a size 5.  With a size 5 in place we trialed a standard offset femoral neck and a 36+1.5 hip ball.  This was reduced in the acetabulum and based on radiographic and clinical assessment we needed more offset and leg length.  I was then able to dislocate the hip and remove the trial components.  We placed the real Actis femoral component size 5 the 1 with high offset and we went with a 36+5 ceramic hip bone again reduces in acetabulum it was very tight and we had to definitely increase his leg length and offset.  I was pleased with clinical and radiographic exam and stability.  The soft tissue was then irrigated with normal saline solution using pulsatile lavage.  The joint capsule was closed with interrupted #1 Ethibond suture followed by #1 Vicryl to close the tensor fascia.  0 Vicryl was used to close the deep tissue and 2-0 Vicryl was used to close the subcutaneous tissue.  The skin was reapproximated with staples.  An Aquacel dressing was applied.  The patient was taken off of the Hana table and taken to recovery room in stable condition.

## 2022-07-29 DIAGNOSIS — J45909 Unspecified asthma, uncomplicated: Secondary | ICD-10-CM | POA: Diagnosis not present

## 2022-07-29 DIAGNOSIS — M1611 Unilateral primary osteoarthritis, right hip: Secondary | ICD-10-CM | POA: Diagnosis not present

## 2022-07-29 DIAGNOSIS — E119 Type 2 diabetes mellitus without complications: Secondary | ICD-10-CM | POA: Diagnosis not present

## 2022-07-29 DIAGNOSIS — Z79899 Other long term (current) drug therapy: Secondary | ICD-10-CM | POA: Diagnosis not present

## 2022-07-29 DIAGNOSIS — I10 Essential (primary) hypertension: Secondary | ICD-10-CM | POA: Diagnosis not present

## 2022-07-29 LAB — BASIC METABOLIC PANEL
Anion gap: 10 (ref 5–15)
BUN: 23 mg/dL (ref 8–23)
CO2: 26 mmol/L (ref 22–32)
Calcium: 9.2 mg/dL (ref 8.9–10.3)
Chloride: 103 mmol/L (ref 98–111)
Creatinine, Ser: 1.05 mg/dL (ref 0.61–1.24)
GFR, Estimated: >60 mL/min (ref >60)
Glucose, Bld: 178 mg/dL — ABNORMAL HIGH (ref 70–99)
Potassium: 3.9 mmol/L (ref 3.5–5.1)
Sodium: 139 mmol/L (ref 135–145)

## 2022-07-29 LAB — CBC
HCT: 38.2 % — ABNORMAL LOW (ref 39.0–52.0)
Hemoglobin: 13 g/dL (ref 13.0–17.0)
MCH: 31.6 pg (ref 26.0–34.0)
MCHC: 34 g/dL (ref 30.0–36.0)
MCV: 92.9 fL (ref 80.0–100.0)
Platelets: 175 10*3/uL (ref 150–400)
RBC: 4.11 MIL/uL — ABNORMAL LOW (ref 4.22–5.81)
RDW: 12.2 % (ref 11.5–15.5)
WBC: 15 10*3/uL — ABNORMAL HIGH (ref 4.0–10.5)
nRBC: 0 % (ref 0.0–0.2)

## 2022-07-29 MED ORDER — OXYCODONE HCL 5 MG PO TABS: 5.0000 mg | ORAL_TABLET | Freq: Four times a day (QID) | ORAL | 0 refills | Status: DC | PRN

## 2022-07-29 MED ORDER — METHOCARBAMOL 500 MG PO TABS: 500.0000 mg | ORAL_TABLET | Freq: Four times a day (QID) | ORAL | 1 refills | Status: DC | PRN

## 2022-07-29 MED ORDER — ASPIRIN 81 MG PO CHEW: 81.0000 mg | CHEWABLE_TABLET | Freq: Two times a day (BID) | ORAL | 0 refills | Status: AC

## 2022-07-29 MED ORDER — ONDANSETRON 4 MG PO TBDP: 4.0000 mg | ORAL_TABLET | Freq: Three times a day (TID) | ORAL | 0 refills | Status: DC | PRN

## 2022-07-29 NOTE — Discharge Instructions (Signed)

## 2022-07-29 NOTE — Plan of Care (Signed)
  Problem: Education: Goal: Knowledge of the prescribed therapeutic regimen will improve Outcome: Progressing Goal: Understanding of discharge needs will improve Outcome: Progressing Goal: Individualized Educational Video(s) Outcome: Completed/Met   Problem: Activity: Goal: Ability to avoid complications of mobility impairment will improve Outcome: Progressing Goal: Ability to tolerate increased activity will improve Outcome: Adequate for Discharge   Problem: Clinical Measurements: Goal: Postoperative complications will be avoided or minimized Outcome: Progressing   Problem: Pain Management: Goal: Pain level will decrease with appropriate interventions Outcome: Adequate for Discharge   Problem: Skin Integrity: Goal: Will show signs of wound healing Outcome: Progressing   Problem: Education: Goal: Knowledge of General Education information will improve Description: Including pain rating scale, medication(s)/side effects and non-pharmacologic comfort measures Outcome: Progressing   Problem: Health Behavior/Discharge Planning: Goal: Ability to manage health-related needs will improve Outcome: Progressing   Problem: Clinical Measurements: Goal: Ability to maintain clinical measurements within normal limits will improve Outcome: Progressing Goal: Will remain free from infection Outcome: Progressing Goal: Diagnostic test results will improve Outcome: Progressing Goal: Respiratory complications will improve Outcome: Progressing Goal: Cardiovascular complication will be avoided Outcome: Progressing   Problem: Activity: Goal: Risk for activity intolerance will decrease Outcome: Progressing   Problem: Nutrition: Goal: Adequate nutrition will be maintained Outcome: Completed/Met   Problem: Coping: Goal: Level of anxiety will decrease Outcome: Progressing   Problem: Elimination: Goal: Will not experience complications related to bowel motility Outcome:  Progressing Goal: Will not experience complications related to urinary retention Outcome: Progressing

## 2022-07-29 NOTE — Plan of Care (Signed)
Pt ready to DC home with wife. 

## 2022-07-29 NOTE — Evaluation (Signed)
Physical Therapy One Time Evaluation Patient Details Name: Tommy Jackson MRN: [id number] DOB: May 01, 1955 Today's Date: 07/29/2022  History of Present Illness  Pt is a 67 year old male s/p R THA direct anterior approach on 07/28/22.  Clinical Impression  Patient evaluated by Physical Therapy with no further acute PT needs identified. All education has been completed and the patient has no further questions.  Pt ambulated in hallway, practiced steps/stairs and performed LE exercises.  Pt provided with HEP handout and had no further questions. Pt feels ready for d/c home today. PT is signing off. Thank you for this referral.        Recommendations for follow up therapy are one component of a multi-disciplinary discharge planning process, led by the attending physician.  Recommendations may be updated based on patient status, additional functional criteria and insurance authorization.  Follow Up Recommendations       Assistance Recommended at Discharge PRN  Patient can return home with the following       Equipment Recommendations None recommended by PT  Recommendations for Other Services       Functional Status Assessment Patient has had a recent decline in their functional status and demonstrates the ability to make significant improvements in function in a reasonable and predictable amount of time.     Precautions / Restrictions Precautions Precautions: None Restrictions Weight Bearing Restrictions: No Other Position/Activity Restrictions: WBAT      Mobility  Bed Mobility Overal bed mobility: Needs Assistance Bed Mobility: Supine to Sit     Supine to sit: HOB elevated, Supervision     General bed mobility comments: cues for technique    Transfers Overall transfer level: Needs assistance Equipment used: Rolling walker (2 wheels) Transfers: Sit to/from Stand Sit to Stand: Supervision           General transfer comment: cues for hand placement     Ambulation/Gait Ambulation/Gait assistance: Min guard, Supervision Gait Distance (Feet): 180 Feet Assistive device: Rolling walker (2 wheels) Gait Pattern/deviations: Step-through pattern, Decreased stride length, Antalgic       General Gait Details: verbal cues for sequence initially, RW positioning, posture  Stairs Stairs: Yes Stairs assistance: Supervision Stair Management: Step to pattern, Forwards, Two rails Number of Stairs: 3 General stair comments: verbal cues for sequence and safety; perform 3 steps as pt wanted to practice (niece's home - having a graduation party in a week); also performed one step with RW forwards since pt has this to enter home; pt had no questions  Wheelchair Mobility    Modified Rankin (Stroke Patients Only)       Balance                                             Pertinent Vitals/Pain Pain Assessment Pain Assessment: 0-10 Pain Score: 5  Pain Location: right hip Pain Descriptors / Indicators: Sore Pain Intervention(s): Repositioned, Monitored during session    Home Living Family/patient expects to be discharged to:: Private residence Living Arrangements: Spouse/significant other   Type of Home: House Home Access: Stairs to enter Entrance Stairs-Rails: None Entrance Stairs-Number of Steps: 1   Home Layout: Able to live on main level with bedroom/bathroom Home Equipment: Agricultural consultant (2 wheels)      Prior Function Prior Level of Function : Independent/Modified Independent  Hand Dominance        Extremity/Trunk Assessment        Lower Extremity Assessment Lower Extremity Assessment: RLE deficits/detail RLE Deficits / Details: anticipated post op hip weakness observed, able to perform ankle pumps       Communication   Communication: No difficulties  Cognition Arousal/Alertness: Awake/alert Behavior During Therapy: WFL for tasks assessed/performed Overall Cognitive  Status: Within Functional Limits for tasks assessed                                          General Comments      Exercises Total Joint Exercises Ankle Circles/Pumps: AROM, 10 reps, Both Quad Sets: AROM, Both, 10 reps Heel Slides: AAROM, Right, 10 reps Hip ABduction/ADduction: AROM, AAROM, Supine, Standing, Right, 10 reps Long Arc Quad: AROM, Seated, Right, 10 reps Knee Flexion: Right, 10 reps, AROM, Standing Marching in Standing: AROM, Right, Standing, 10 reps Standing Hip Extension: AROM, Right, Standing, 10 reps   Assessment/Plan    PT Assessment All further PT needs can be met in the next venue of care  PT Problem List Decreased strength;Decreased range of motion;Pain;Decreased mobility;Decreased knowledge of use of DME       PT Treatment Interventions      PT Goals (Current goals can be found in the Care Plan section)  Acute Rehab PT Goals PT Goal Formulation: All assessment and education complete, DC therapy    Frequency       Co-evaluation               AM-PAC PT "6 Clicks" Mobility  Outcome Measure Help needed turning from your back to your side while in a flat bed without using bedrails?: A Little Help needed moving from lying on your back to sitting on the side of a flat bed without using bedrails?: A Little Help needed moving to and from a bed to a chair (including a wheelchair)?: A Little Help needed standing up from a chair using your arms (e.g., wheelchair or bedside chair)?: A Little Help needed to walk in hospital room?: A Little Help needed climbing 3-5 steps with a railing? : A Little 6 Click Score: 18    End of Session Equipment Utilized During Treatment: Gait belt Activity Tolerance: Patient tolerated treatment well Patient left: in chair;with call bell/phone within reach Nurse Communication: Mobility status PT Visit Diagnosis: Difficulty in walking, not elsewhere classified (R26.2)    Time: 4098-1191 PT Time  Calculation (min) (ACUTE ONLY): 37 min   Charges:   PT Evaluation $PT Eval Low Complexity: 1 Low PT Treatments $Therapeutic Exercise: 8-22 mins   Paulino Door, DPT Physical Therapist Acute Rehabilitation Services Office: [phone]   Tommy Jackson 07/29/2022, 12:45 PM

## 2022-07-29 NOTE — Discharge Summary (Signed)
Patient ID: Tommy Jackson MRN: [id number] DOB/AGE: January 18, 1956 66 y.o.  Admit date: 07/28/2022 Discharge date: 07/29/2022  Admission Diagnoses:  Principal Problem:   Unilateral primary osteoarthritis, right hip Active Problems:   Status post total replacement of right hip   Discharge Diagnoses:  Same  Past Medical History:  Diagnosis Date   Allergy    Arthritis    ASTHMA 09/07/2009   Diabetes mellitus without complication (HCC)    Herniated disc    HYPERLIPIDEMIA 02/07/2007   HYPERTENSION 11/29/2005   OBESITY 10/01/2007   Sleep apnea    cpap   SLEEP APNEA, OBSTRUCTIVE 10/01/2007    Surgeries: Procedure(s): RIGHT TOTAL HIP ARTHROPLASTY ANTERIOR APPROACH on 07/28/2022   Consultants:   Discharged Condition: Improved  Hospital Course: Tommy Jackson is an 67 y.o. male who was admitted 07/28/2022 for operative treatment ofUnilateral primary osteoarthritis, right hip. Patient has severe unremitting pain that affects sleep, daily activities, and work/hobbies. After pre-op clearance the patient was taken to the operating room on 07/28/2022 and underwent  Procedure(s): RIGHT TOTAL HIP ARTHROPLASTY ANTERIOR APPROACH.    Patient was given perioperative antibiotics:  Anti-infectives (From admission, onward)    Start     Dose/Rate Route Frequency Ordered Stop   07/28/22 2000  ceFAZolin (ANCEF) IVPB 2g/100 mL premix        2 g 200 mL/hr over 30 Minutes Intravenous Every 6 hours 07/28/22 1751 07/29/22 0220   07/28/22 1015  ceFAZolin (ANCEF) IVPB 2g/100 mL premix        2 g 200 mL/hr over 30 Minutes Intravenous On call to O.R. 07/28/22 1012 07/28/22 1426        Patient was given sequential compression devices, early ambulation, and chemoprophylaxis to prevent DVT.  Patient benefited maximally from hospital stay and there were no complications.    Recent vital signs: Patient Vitals for the past 24 hrs:  BP Temp Temp src Pulse Resp SpO2  07/29/22 1009 126/82 98.9 F (37.2 C)  Oral 78 16 94 %  07/29/22 0627 (!) 146/83 97.9 F (36.6 C) Oral 74 17 --  07/29/22 0152 124/84 97.9 F (36.6 C) Oral 83 17 96 %  07/28/22 2147 (!) 146/80 98.4 F (36.9 C) Oral 78 17 99 %  07/28/22 2001 -- -- -- 77 17 99 %  07/28/22 1926 136/87 98.2 F (36.8 C) Oral 69 17 96 %  07/28/22 1754 (!) 151/99 -- -- 65 16 99 %     Recent laboratory studies:  Recent Labs    07/29/22 0334  WBC 15.0*  HGB 13.0  HCT 38.2*  PLT 175  NA 139  K 3.9  CL 103  CO2 26  BUN 23  CREATININE 1.05  GLUCOSE 178*  CALCIUM 9.2     Discharge Medications:   Allergies as of 07/29/2022       Reactions   Aspirin    REACTION: unspecified   Glycerin Nausea Only   Penicillins    REACTION: family hx   Metformin And Related Diarrhea        Medication List     TAKE these medications    albuterol 108 (90 Base) MCG/ACT inhaler Commonly known as: Ventolin HFA Inhale 2 puffs into the lungs every 4 (four) hours as needed.   amLODipine 10 MG tablet Commonly known as: NORVASC TAKE 1 TABLET BY MOUTH EVERY DAY   aspirin 81 MG chewable tablet Chew 1 tablet (81 mg total) by mouth 2 (two) times daily.   azelastine 0.1 % nasal spray  Commonly known as: ASTELIN Place 2 sprays into both nostrils daily as needed for allergies.   cetirizine 10 MG tablet Commonly known as: ZYRTEC Take 10 mg by mouth daily as needed for allergies.   fenofibrate 145 MG tablet Commonly known as: TRICOR Take 1 tablet (145 mg total) by mouth daily.   fluticasone 50 MCG/ACT nasal spray Commonly known as: FLONASE Place 2 sprays into both nostrils daily as needed for allergies.   furosemide 20 MG tablet Commonly known as: LASIX Take 1 tablet (20 mg total) by mouth daily.   meloxicam 15 MG tablet Commonly known as: MOBIC Take 1 tablet by mouth daily.   methocarbamol 500 MG tablet Commonly known as: ROBAXIN Take 1 tablet (500 mg total) by mouth every 6 (six) hours as needed for muscle spasms.   metoprolol  tartrate 100 MG tablet Commonly known as: LOPRESSOR Take 1 tablet (100 mg total) by mouth 2 (two) times daily.   multivitamin capsule Take 1 capsule by mouth daily.   ondansetron 4 MG disintegrating tablet Commonly known as: ZOFRAN-ODT Take 1 tablet (4 mg total) by mouth every 8 (eight) hours as needed for nausea or vomiting.   oxyCODONE 5 MG immediate release tablet Commonly known as: Oxy IR/ROXICODONE Take 1-2 tablets (5-10 mg total) by mouth every 6 (six) hours as needed for moderate pain (pain score 4-6).   potassium chloride 10 MEQ tablet Commonly known as: KLOR-CON M Take 1 tablet (10 mEq total) by mouth daily.   pravastatin 40 MG tablet Commonly known as: PRAVACHOL Take 1 tablet (40 mg total) by mouth daily.   Trulicity 1.5 MG/0.5ML Sopn Generic drug: Dulaglutide INJECT 1.5 MG (0.5ML) UNDER THE SKIN ONCE A WEEK What changed: See the new instructions.   valsartan-hydrochlorothiazide 320-25 MG tablet Commonly known as: DIOVAN-HCT Take 1 tablet by mouth daily.   vitamin C 100 MG tablet Take 500 mg by mouth daily.               Durable Medical Equipment  (From admission, onward)           Start     Ordered   07/28/22 1752  DME 3 n 1  Once        07/28/22 1751   07/28/22 1752  DME Walker rolling  Once       Question Answer Comment  Walker: With 5 Inch Wheels   Patient needs a walker to treat with the following condition Status post total replacement of right hip      07/28/22 1751            Diagnostic Studies: DG Pelvis Portable  Result Date: 07/28/2022 CLINICAL DATA:  [id number] Status post total replacement of right hip [id number] EXAM: PORTABLE PELVIS 1-2 VIEWS COMPARISON:  Radiograph 04/27/2022 FINDINGS: Postsurgical changes of right hip arthroplasty. Normal alignment. No evidence of loosening or periprosthetic fracture. Expected soft tissue changes. IMPRESSION: Postsurgical changes of right hip arthroplasty. No evidence of immediate hardware  complication. Electronically Signed   By: Caprice Renshaw M.D.   On: 07/28/2022 16:40   DG HIP UNILAT WITH PELVIS 1V RIGHT  Result Date: 07/28/2022 CLINICAL DATA:  [id number] Surgery, elective [id number] EXAM: DG HIP (WITH OR WITHOUT PELVIS) 1V RIGHT COMPARISON:  04/27/2022 FINDINGS: Intraoperative fluoroscopic images during right hip arthroplasty. Normal alignment. No evidence of loosening or fracture. Expected soft tissue changes. Fluoroscopy time 37 seconds. Dose: 2.32 mGy. IMPRESSION: Intraoperative fluoroscopic images during right hip arthroplasty. Normal alignment. Fluoroscopy time/dose as above. Electronically Signed  By: Caprice Renshaw M.D.   On: 07/28/2022 16:39   DG C-Arm 1-60 Min-No Report  Result Date: 07/28/2022 Fluoroscopy was utilized by the requesting physician.  No radiographic interpretation.    Disposition: Discharge disposition: 01-Home or Self Care          Follow-up Information     Kathryne Hitch, MD Follow up in 2 week(s).   Specialty: Orthopedic Surgery Contact information: [address] [place] [address] [phone]                  Signed: Kathryne Hitch 07/29/2022, 5:41 PM

## 2022-07-29 NOTE — Progress Notes (Signed)
Subjective: 1 Day Post-Op Procedure(s) (LRB): RIGHT TOTAL HIP ARTHROPLASTY ANTERIOR APPROACH (Right) Patient reports pain as moderate.    Objective: Vital signs in last 24 hours: Temp:  [96.6 F (35.9 C)-98.9 F (37.2 C)] 98.9 F (37.2 C) (06/01 1009) Pulse Rate:  [56-83] 78 (06/01 1009) Resp:  [9-17] 16 (06/01 1009) BP: (93-151)/(62-99) 126/82 (06/01 1009) SpO2:  [94 %-99 %] 94 % (06/01 1009) FiO2 (%):  [21 %] 21 % (05/31 2001)  Intake/Output from previous day: 05/31 0701 - 06/01 0700 In: 3102 [P.O.:360; I.V.:2542; IV Piggyback:200] Out: 3200 [Urine:2900; Blood:300] Intake/Output this shift: Total I/O In: 240 [P.O.:240] Out: -   Recent Labs    07/29/22 0334  HGB 13.0   Recent Labs    07/29/22 0334  WBC 15.0*  RBC 4.11*  HCT 38.2*  PLT 175   Recent Labs    07/29/22 0334  NA 139  K 3.9  CL 103  CO2 26  BUN 23  CREATININE 1.05  GLUCOSE 178*  CALCIUM 9.2   No results for input(s): "LABPT", "INR" in the last 72 hours.  Sensation intact distally Intact pulses distally Dorsiflexion/Plantar flexion intact Incision: dressing C/D/I No cellulitis present   Assessment/Plan: 1 Day Post-Op Procedure(s) (LRB): RIGHT TOTAL HIP ARTHROPLASTY ANTERIOR APPROACH (Right) Up with therapy Discharge home with home health      Kathryne Hitch 07/29/2022, 11:39 AM

## 2022-07-30 ENCOUNTER — Encounter (HOSPITAL_COMMUNITY): Payer: Self-pay | Admitting: Orthopaedic Surgery

## 2022-07-30 DIAGNOSIS — M5412 Radiculopathy, cervical region: Secondary | ICD-10-CM | POA: Diagnosis not present

## 2022-07-30 DIAGNOSIS — M519 Unspecified thoracic, thoracolumbar and lumbosacral intervertebral disc disorder: Secondary | ICD-10-CM | POA: Diagnosis not present

## 2022-07-30 DIAGNOSIS — Z6841 Body Mass Index (BMI) 40.0 and over, adult: Secondary | ICD-10-CM | POA: Diagnosis not present

## 2022-07-30 DIAGNOSIS — I10 Essential (primary) hypertension: Secondary | ICD-10-CM | POA: Diagnosis not present

## 2022-07-30 DIAGNOSIS — Z7985 Long-term (current) use of injectable non-insulin antidiabetic drugs: Secondary | ICD-10-CM | POA: Diagnosis not present

## 2022-07-30 DIAGNOSIS — Z96641 Presence of right artificial hip joint: Secondary | ICD-10-CM | POA: Diagnosis not present

## 2022-07-30 DIAGNOSIS — Z791 Long term (current) use of non-steroidal anti-inflammatories (NSAID): Secondary | ICD-10-CM | POA: Diagnosis not present

## 2022-07-30 DIAGNOSIS — E669 Obesity, unspecified: Secondary | ICD-10-CM | POA: Diagnosis not present

## 2022-07-30 DIAGNOSIS — J45909 Unspecified asthma, uncomplicated: Secondary | ICD-10-CM | POA: Diagnosis not present

## 2022-07-30 DIAGNOSIS — E119 Type 2 diabetes mellitus without complications: Secondary | ICD-10-CM | POA: Diagnosis not present

## 2022-07-30 DIAGNOSIS — F1090 Alcohol use, unspecified, uncomplicated: Secondary | ICD-10-CM | POA: Diagnosis not present

## 2022-07-30 DIAGNOSIS — E785 Hyperlipidemia, unspecified: Secondary | ICD-10-CM | POA: Diagnosis not present

## 2022-07-30 DIAGNOSIS — G4733 Obstructive sleep apnea (adult) (pediatric): Secondary | ICD-10-CM | POA: Diagnosis not present

## 2022-07-30 DIAGNOSIS — Z7982 Long term (current) use of aspirin: Secondary | ICD-10-CM | POA: Diagnosis not present

## 2022-07-30 DIAGNOSIS — Z471 Aftercare following joint replacement surgery: Secondary | ICD-10-CM | POA: Diagnosis not present

## 2022-07-30 NOTE — Anesthesia Postprocedure Evaluation (Signed)
Anesthesia Post Note  Patient: Tommy Jackson  Procedure(s) Performed: RIGHT TOTAL HIP ARTHROPLASTY ANTERIOR APPROACH (Right: Hip)     Patient location during evaluation: PACU Anesthesia Type: Spinal Level of consciousness: awake and alert Pain management: pain level controlled Vital Signs Assessment: post-procedure vital signs reviewed and stable Respiratory status: spontaneous breathing and respiratory function stable Cardiovascular status: blood pressure returned to baseline and stable Postop Assessment: spinal receding and no apparent nausea or vomiting Anesthetic complications: no   No notable events documented.  Last Vitals:  Vitals:   07/29/22 0627 07/29/22 1009  BP: (!) 146/83 126/82  Pulse: 74 78  Resp: 17 16  Temp: 36.6 C 37.2 C  SpO2:  94%    Last Pain:  Vitals:   07/29/22 1009  TempSrc: Oral  PainSc:                  Beryle Lathe

## 2022-07-31 ENCOUNTER — Telehealth: Payer: Self-pay | Admitting: Orthopaedic Surgery

## 2022-07-31 NOTE — Telephone Encounter (Signed)
Tommy Jackson with [facility] request verbal orders for PT 3 times a week for 3 weeks   Tommy Jackson call back number [phone]

## 2022-07-31 NOTE — Telephone Encounter (Signed)
Lvm advising  

## 2022-08-01 DIAGNOSIS — I10 Essential (primary) hypertension: Secondary | ICD-10-CM | POA: Diagnosis not present

## 2022-08-01 DIAGNOSIS — M519 Unspecified thoracic, thoracolumbar and lumbosacral intervertebral disc disorder: Secondary | ICD-10-CM | POA: Diagnosis not present

## 2022-08-01 DIAGNOSIS — J45909 Unspecified asthma, uncomplicated: Secondary | ICD-10-CM | POA: Diagnosis not present

## 2022-08-01 DIAGNOSIS — E119 Type 2 diabetes mellitus without complications: Secondary | ICD-10-CM | POA: Diagnosis not present

## 2022-08-01 DIAGNOSIS — Z471 Aftercare following joint replacement surgery: Secondary | ICD-10-CM | POA: Diagnosis not present

## 2022-08-01 DIAGNOSIS — M5412 Radiculopathy, cervical region: Secondary | ICD-10-CM | POA: Diagnosis not present

## 2022-08-03 DIAGNOSIS — Z471 Aftercare following joint replacement surgery: Secondary | ICD-10-CM | POA: Diagnosis not present

## 2022-08-03 DIAGNOSIS — J45909 Unspecified asthma, uncomplicated: Secondary | ICD-10-CM | POA: Diagnosis not present

## 2022-08-03 DIAGNOSIS — E119 Type 2 diabetes mellitus without complications: Secondary | ICD-10-CM | POA: Diagnosis not present

## 2022-08-03 DIAGNOSIS — I10 Essential (primary) hypertension: Secondary | ICD-10-CM | POA: Diagnosis not present

## 2022-08-03 DIAGNOSIS — M519 Unspecified thoracic, thoracolumbar and lumbosacral intervertebral disc disorder: Secondary | ICD-10-CM | POA: Diagnosis not present

## 2022-08-03 DIAGNOSIS — M5412 Radiculopathy, cervical region: Secondary | ICD-10-CM | POA: Diagnosis not present

## 2022-08-07 DIAGNOSIS — M5412 Radiculopathy, cervical region: Secondary | ICD-10-CM | POA: Diagnosis not present

## 2022-08-07 DIAGNOSIS — M519 Unspecified thoracic, thoracolumbar and lumbosacral intervertebral disc disorder: Secondary | ICD-10-CM | POA: Diagnosis not present

## 2022-08-07 DIAGNOSIS — Z471 Aftercare following joint replacement surgery: Secondary | ICD-10-CM | POA: Diagnosis not present

## 2022-08-07 DIAGNOSIS — I10 Essential (primary) hypertension: Secondary | ICD-10-CM | POA: Diagnosis not present

## 2022-08-07 DIAGNOSIS — E119 Type 2 diabetes mellitus without complications: Secondary | ICD-10-CM | POA: Diagnosis not present

## 2022-08-07 DIAGNOSIS — J45909 Unspecified asthma, uncomplicated: Secondary | ICD-10-CM | POA: Diagnosis not present

## 2022-08-09 DIAGNOSIS — M519 Unspecified thoracic, thoracolumbar and lumbosacral intervertebral disc disorder: Secondary | ICD-10-CM | POA: Diagnosis not present

## 2022-08-09 DIAGNOSIS — J45909 Unspecified asthma, uncomplicated: Secondary | ICD-10-CM | POA: Diagnosis not present

## 2022-08-09 DIAGNOSIS — I10 Essential (primary) hypertension: Secondary | ICD-10-CM | POA: Diagnosis not present

## 2022-08-09 DIAGNOSIS — E119 Type 2 diabetes mellitus without complications: Secondary | ICD-10-CM | POA: Diagnosis not present

## 2022-08-09 DIAGNOSIS — M5412 Radiculopathy, cervical region: Secondary | ICD-10-CM | POA: Diagnosis not present

## 2022-08-09 DIAGNOSIS — Z471 Aftercare following joint replacement surgery: Secondary | ICD-10-CM | POA: Diagnosis not present

## 2022-08-10 ENCOUNTER — Ambulatory Visit (INDEPENDENT_AMBULATORY_CARE_PROVIDER_SITE_OTHER): Payer: Medicare Other | Admitting: Orthopaedic Surgery

## 2022-08-10 ENCOUNTER — Encounter: Payer: Self-pay | Admitting: Orthopaedic Surgery

## 2022-08-10 DIAGNOSIS — Z96641 Presence of right artificial hip joint: Secondary | ICD-10-CM

## 2022-08-10 NOTE — Progress Notes (Signed)
The patient is here for his first postoperative visit status post a right total hip arthroplasty.  He comes in ambulating with a cane today and said he is doing well.  He has been compliant with a baby aspirin twice a day and wearing TED hose.  His calf is soft.  He is able to bend his foot back-and-forth.  He does have a seroma and I was able to aspirate about 40 cc of fluid.  His incision looks good and the staples been removed and Steri-Strips applied.  His leg lengths are equal.  He will continue to increase his activities as comfort allows.  He can drop from my standpoint as long as he is not taking narcotic.  We will see him back in 4 weeks to see how he is doing overall but no x-rays are needed.

## 2022-08-11 DIAGNOSIS — M5412 Radiculopathy, cervical region: Secondary | ICD-10-CM | POA: Diagnosis not present

## 2022-08-11 DIAGNOSIS — M519 Unspecified thoracic, thoracolumbar and lumbosacral intervertebral disc disorder: Secondary | ICD-10-CM | POA: Diagnosis not present

## 2022-08-11 DIAGNOSIS — I10 Essential (primary) hypertension: Secondary | ICD-10-CM | POA: Diagnosis not present

## 2022-08-11 DIAGNOSIS — J45909 Unspecified asthma, uncomplicated: Secondary | ICD-10-CM | POA: Diagnosis not present

## 2022-08-11 DIAGNOSIS — E119 Type 2 diabetes mellitus without complications: Secondary | ICD-10-CM | POA: Diagnosis not present

## 2022-08-11 DIAGNOSIS — Z471 Aftercare following joint replacement surgery: Secondary | ICD-10-CM | POA: Diagnosis not present

## 2022-08-15 DIAGNOSIS — J45909 Unspecified asthma, uncomplicated: Secondary | ICD-10-CM | POA: Diagnosis not present

## 2022-08-15 DIAGNOSIS — M5412 Radiculopathy, cervical region: Secondary | ICD-10-CM | POA: Diagnosis not present

## 2022-08-15 DIAGNOSIS — E119 Type 2 diabetes mellitus without complications: Secondary | ICD-10-CM | POA: Diagnosis not present

## 2022-08-15 DIAGNOSIS — M519 Unspecified thoracic, thoracolumbar and lumbosacral intervertebral disc disorder: Secondary | ICD-10-CM | POA: Diagnosis not present

## 2022-08-15 DIAGNOSIS — I10 Essential (primary) hypertension: Secondary | ICD-10-CM | POA: Diagnosis not present

## 2022-08-15 DIAGNOSIS — Z471 Aftercare following joint replacement surgery: Secondary | ICD-10-CM | POA: Diagnosis not present

## 2022-08-16 DIAGNOSIS — I10 Essential (primary) hypertension: Secondary | ICD-10-CM | POA: Diagnosis not present

## 2022-08-16 DIAGNOSIS — J45909 Unspecified asthma, uncomplicated: Secondary | ICD-10-CM | POA: Diagnosis not present

## 2022-08-16 DIAGNOSIS — E119 Type 2 diabetes mellitus without complications: Secondary | ICD-10-CM | POA: Diagnosis not present

## 2022-08-16 DIAGNOSIS — M5412 Radiculopathy, cervical region: Secondary | ICD-10-CM | POA: Diagnosis not present

## 2022-08-16 DIAGNOSIS — Z471 Aftercare following joint replacement surgery: Secondary | ICD-10-CM | POA: Diagnosis not present

## 2022-08-16 DIAGNOSIS — M519 Unspecified thoracic, thoracolumbar and lumbosacral intervertebral disc disorder: Secondary | ICD-10-CM | POA: Diagnosis not present

## 2022-08-17 ENCOUNTER — Telehealth: Payer: Self-pay

## 2022-08-17 DIAGNOSIS — E119 Type 2 diabetes mellitus without complications: Secondary | ICD-10-CM | POA: Diagnosis not present

## 2022-08-17 DIAGNOSIS — M519 Unspecified thoracic, thoracolumbar and lumbosacral intervertebral disc disorder: Secondary | ICD-10-CM | POA: Diagnosis not present

## 2022-08-17 DIAGNOSIS — M5412 Radiculopathy, cervical region: Secondary | ICD-10-CM | POA: Diagnosis not present

## 2022-08-17 DIAGNOSIS — J45909 Unspecified asthma, uncomplicated: Secondary | ICD-10-CM | POA: Diagnosis not present

## 2022-08-17 DIAGNOSIS — Z471 Aftercare following joint replacement surgery: Secondary | ICD-10-CM | POA: Diagnosis not present

## 2022-08-17 DIAGNOSIS — I10 Essential (primary) hypertension: Secondary | ICD-10-CM | POA: Diagnosis not present

## 2022-08-17 NOTE — Telephone Encounter (Signed)
Victorino Dike, PT with [facility] wanted to let Dr. Magnus Ivan know that patient is being discharged from PT due to meeting his goal.

## 2022-09-04 ENCOUNTER — Ambulatory Visit (INDEPENDENT_AMBULATORY_CARE_PROVIDER_SITE_OTHER): Payer: Medicare Other | Admitting: Physician Assistant

## 2022-09-04 ENCOUNTER — Encounter: Payer: Self-pay | Admitting: Physician Assistant

## 2022-09-04 DIAGNOSIS — Z96641 Presence of right artificial hip joint: Secondary | ICD-10-CM

## 2022-09-04 NOTE — Progress Notes (Signed)
HPI: Patient returns today status post right total hip arthroplasty 07/28/2022.  States overall he is doing well.  States he has 2 out of 10 pain at worst with certain movements.  Otherwise reports no pain in the hip.  He said no fevers chills.  He is taking no pain medication.  Physical exam: General well-developed well-nourished male is able to get on and off the exam table on his own.  Walks without any assistive device and a nonantalgic gait.  Right hip: Good range of motion.  Surgical incisions healing well no signs of infection.  Slight seroma.  10 cc of bloody aspirate obtained today.  Right calf supple nontender.  Dorsiflexion plantarflexion of the ankle intact.  Plan: He will follow-up with [place] in 4 weeks to see how he is doing overall.  Sooner if there is any questions concerns.

## 2022-09-07 ENCOUNTER — Encounter: Payer: Medicare Other | Admitting: Physician Assistant

## 2022-09-13 ENCOUNTER — Other Ambulatory Visit: Payer: Self-pay | Admitting: Adult Health

## 2022-09-13 DIAGNOSIS — I10 Essential (primary) hypertension: Secondary | ICD-10-CM

## 2022-10-02 ENCOUNTER — Ambulatory Visit: Payer: Medicare Other | Admitting: Physician Assistant

## 2022-10-02 ENCOUNTER — Encounter: Payer: Self-pay | Admitting: Physician Assistant

## 2022-10-02 VITALS — Ht 72.0 in | Wt 298.0 lb

## 2022-10-02 DIAGNOSIS — Z96641 Presence of right artificial hip joint: Secondary | ICD-10-CM

## 2022-10-02 NOTE — Progress Notes (Signed)
HPI: Mr.Para returns today status post right total hip arthroplasty 07/28/2022.  He states overall he is doing well.  Feels he is overall improving as far strength.  He is taking no pain medication.  Rates pain to be 0 out of 10 in the right hip.  No complaints.  Physical exam: General well-developed well-nourished male no acute distress ambulates without any assistive device. Right hip good range of motion without pain.  Calf supple nontender.  Dorsiflexion plantarflexion right ankle intact.  Impression: Status post right total hip arthroplasty  Plan: Will have him follow-up with [place] in 4 months at that time we will obtain an AP pelvis and lateral view of his right hip.  Will see him sooner if there is any questions concerns.  Questions were encouraged and answered at length.

## 2022-10-16 ENCOUNTER — Other Ambulatory Visit: Payer: Self-pay | Admitting: Adult Health

## 2022-10-16 DIAGNOSIS — R600 Localized edema: Secondary | ICD-10-CM

## 2022-10-18 DIAGNOSIS — H43393 Other vitreous opacities, bilateral: Secondary | ICD-10-CM | POA: Diagnosis not present

## 2022-10-18 DIAGNOSIS — H5203 Hypermetropia, bilateral: Secondary | ICD-10-CM | POA: Diagnosis not present

## 2022-10-18 DIAGNOSIS — H02834 Dermatochalasis of left upper eyelid: Secondary | ICD-10-CM | POA: Diagnosis not present

## 2022-10-18 DIAGNOSIS — H2513 Age-related nuclear cataract, bilateral: Secondary | ICD-10-CM | POA: Diagnosis not present

## 2022-10-18 DIAGNOSIS — H524 Presbyopia: Secondary | ICD-10-CM | POA: Diagnosis not present

## 2022-10-18 DIAGNOSIS — E119 Type 2 diabetes mellitus without complications: Secondary | ICD-10-CM | POA: Diagnosis not present

## 2022-10-18 DIAGNOSIS — H02831 Dermatochalasis of right upper eyelid: Secondary | ICD-10-CM | POA: Diagnosis not present

## 2022-10-18 DIAGNOSIS — H04123 Dry eye syndrome of bilateral lacrimal glands: Secondary | ICD-10-CM | POA: Diagnosis not present

## 2022-10-18 DIAGNOSIS — H52203 Unspecified astigmatism, bilateral: Secondary | ICD-10-CM | POA: Diagnosis not present

## 2022-10-27 ENCOUNTER — Other Ambulatory Visit: Payer: Self-pay | Admitting: Adult Health

## 2022-10-27 DIAGNOSIS — I10 Essential (primary) hypertension: Secondary | ICD-10-CM

## 2022-11-17 ENCOUNTER — Other Ambulatory Visit: Payer: Self-pay | Admitting: Adult Health

## 2022-11-17 DIAGNOSIS — I10 Essential (primary) hypertension: Secondary | ICD-10-CM

## 2022-11-17 DIAGNOSIS — E785 Hyperlipidemia, unspecified: Secondary | ICD-10-CM

## 2023-01-05 ENCOUNTER — Other Ambulatory Visit: Payer: Self-pay | Admitting: Adult Health

## 2023-02-01 ENCOUNTER — Ambulatory Visit: Payer: Medicare Other | Admitting: Physician Assistant

## 2023-02-05 ENCOUNTER — Ambulatory Visit (INDEPENDENT_AMBULATORY_CARE_PROVIDER_SITE_OTHER): Payer: Medicare Other | Admitting: Physician Assistant

## 2023-02-05 ENCOUNTER — Other Ambulatory Visit (INDEPENDENT_AMBULATORY_CARE_PROVIDER_SITE_OTHER): Payer: Self-pay

## 2023-02-05 ENCOUNTER — Encounter: Payer: Self-pay | Admitting: Physician Assistant

## 2023-02-05 DIAGNOSIS — Z96641 Presence of right artificial hip joint: Secondary | ICD-10-CM

## 2023-02-05 NOTE — Progress Notes (Signed)
HPI: Fransisco returns today status post right total hip arthroplasty.  He is now just over 6 months status post surgery.  He states he is doing well.  States he has some soreness with hip flexion.  But otherwise is doing very well.  Review of systems: See HPI otherwise negative  Physical exam: General Well-developed well-nourished male who ambulates without an assistive device. Bilateral hips good range of motion both hips.  No pain with internal/external rotation.  Hip flexion is equal bilaterally.  Calf supple nontender.  Radiographs: AP pelvis lateral view right hip: Bilateral hips well located.  Status post right total hip arthroplasty well-seated components.  No acute fractures acute findings.  Impression: Status post right total hip arthroplasty  Plan: Discussed with him send him formal therapy he would like to continue to work on stretching on his own  Having follow-up with [place] at 1 year postop.  He will follow-up sooner if there is any questions concerns.  AP pelvis and lateral view of the right hip at that time.

## 2023-03-12 ENCOUNTER — Other Ambulatory Visit: Payer: Self-pay | Admitting: Adult Health

## 2023-03-12 DIAGNOSIS — I10 Essential (primary) hypertension: Secondary | ICD-10-CM

## 2023-03-26 ENCOUNTER — Other Ambulatory Visit: Payer: Self-pay | Admitting: Adult Health

## 2023-03-26 DIAGNOSIS — E785 Hyperlipidemia, unspecified: Secondary | ICD-10-CM

## 2023-03-26 DIAGNOSIS — I10 Essential (primary) hypertension: Secondary | ICD-10-CM

## 2023-03-30 ENCOUNTER — Other Ambulatory Visit: Payer: Self-pay | Admitting: Adult Health

## 2023-04-11 DIAGNOSIS — M5416 Radiculopathy, lumbar region: Secondary | ICD-10-CM | POA: Diagnosis not present

## 2023-05-03 ENCOUNTER — Telehealth: Payer: Self-pay | Admitting: *Deleted

## 2023-05-03 NOTE — Telephone Encounter (Signed)
Tommy Jackson's pt  

## 2023-05-03 NOTE — Telephone Encounter (Signed)
 Patient is up for renewal for Trulicity PAP via [employer]. Completed office portion via online portal and routed patient portion to designated patient email. Patient aware and plans to complete his portion today. Patient will reach back out with any concerns.

## 2023-05-03 NOTE — Telephone Encounter (Signed)
 Copied from CRM [id number]. Topic: Clinical - Prescription Issue >> May 03, 2023  9:33 AM Sim Boast F wrote: Reason for CRM: Patient called says the [facility] needs a new script for the Dulaglutide medication faxed over to [phone]. Their phone number is [phone].  Please call patient with any questions

## 2023-05-07 NOTE — Telephone Encounter (Signed)
 Patient has been approved to continue to receive Trulicity through 02/27/24 through the [employer] PAP.  Sherrill Raring, PharmD Clinical Pharmacist [phone]

## 2023-07-02 ENCOUNTER — Telehealth: Payer: Self-pay

## 2023-07-02 NOTE — Telephone Encounter (Signed)
 Copied from CRM [id number]. Topic: Clinical - Medication Question >> Jul 02, 2023 10:59 AM Adonis Hoot wrote: Reason for CRM: Patient called in stating that [facility] stated that they have not received prescription for  Dulaglutide  (TRULICITY ) 1.5 MG/0.5ML SOAJ. Patient stated that he is on his last pen. Fx#:[phone] Attn:Pharmacy

## 2023-07-02 NOTE — Addendum Note (Signed)
 Addended by: Carnell Christian on: 07/02/2023 11:33 AM   Modules accepted: Orders

## 2023-07-04 NOTE — Telephone Encounter (Signed)
Rx faxed in to pharmacy.

## 2023-07-09 ENCOUNTER — Ambulatory Visit (INDEPENDENT_AMBULATORY_CARE_PROVIDER_SITE_OTHER): Payer: Medicare Other | Admitting: Physician Assistant

## 2023-07-09 ENCOUNTER — Other Ambulatory Visit (INDEPENDENT_AMBULATORY_CARE_PROVIDER_SITE_OTHER): Payer: Self-pay

## 2023-07-09 ENCOUNTER — Encounter: Payer: Self-pay | Admitting: Physician Assistant

## 2023-07-09 DIAGNOSIS — Z96641 Presence of right artificial hip joint: Secondary | ICD-10-CM

## 2023-07-09 NOTE — Progress Notes (Signed)
 Office Visit Note   Patient: Tommy Jackson           Date of Birth: September 06, 1955           MRN: [id number] Visit Date: 07/09/2023              Requested by: Alto Atta, NP [address] [place],  [place] [address] PCP: Alto Atta, NP   Assessment & Plan: Visit Diagnoses:  1. Status post total replacement of right hip     Plan: He will follow-up with us  as needed.  Questions were encouraged and answered at length.  Follow-Up Instructions: Return if symptoms worsen or fail to improve.   Orders:  Orders Placed This Encounter  Procedures   XR HIP UNILAT W OR W/O PELVIS 2-3 VIEWS RIGHT   No orders of the defined types were placed in this encounter.     Procedures: No procedures performed   Clinical Data: No additional findings.   Subjective: Chief Complaint  Patient presents with   Right Hip - Follow-up    HPI Tommy Jackson returns today status post right total hip arthroplasty 07/28/2022.  States overall he is doing well.  No pain in the right hip not taking anything for any pain.  Still has some low back pain.  His questions of his leg lengths are equal.  Review of Systems See HPI  Objective: Vital Signs: There were no vitals taken for this visit.  Physical Exam Constitutional:      Appearance: He is not ill-appearing or diaphoretic.  Pulmonary:     Effort: Respiratory distress present.  Neurological:     Mental Status: He is alert and oriented to person, place, and time.  Psychiatric:        Behavior: Behavior normal.     Ortho Exam Right hip good range of motion without pain.  Leg lengths are equal. Specialty Comments:  No specialty comments available.  Imaging: XR HIP UNILAT W OR W/O PELVIS 2-3 VIEWS RIGHT Result Date: 07/09/2023 AP pelvis lateral view right hip: Bilateral hips well located.  Right total hip arthroplasty components well-seated no complicating features.  No acute fractures or acute findings.    PMFS History: Patient  Active Problem List   Diagnosis Date Noted   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   Prediabetes 02/18/2015   Left leg pain 09/01/2013   Left knee pain 09/01/2013   Left hip pain 08/04/2013   Allergic rhinitis 04/09/2013   Other abnormal glucose 10/24/2012   Dermatitis 05/07/2012   Cervical radiculopathy 02/22/2012   HERNIATED DISC 05/11/2010   Asthma 09/07/2009   Obesity 10/01/2007   Obstructive sleep apnea 10/01/2007   Dyslipidemia 02/07/2007   Essential hypertension 11/29/2005   Past Medical History:  Diagnosis Date   Allergy    Arthritis    ASTHMA 09/07/2009   Diabetes mellitus without complication (HCC)    Herniated disc    HYPERLIPIDEMIA 02/07/2007   HYPERTENSION 11/29/2005   OBESITY 10/01/2007   Sleep apnea    cpap   SLEEP APNEA, OBSTRUCTIVE 10/01/2007    Family History  Problem Relation Age of Onset   Colon cancer Father 86   Lung cancer Father    CAD Father    Diabetes Father    Heart attack Father    Colon cancer Brother 93   Heart attack Paternal Grandfather    CAD Brother    Esophageal cancer Neg Hx    Stomach cancer Neg Hx    Rectal  cancer Neg Hx     Past Surgical History:  Procedure Laterality Date   bicep surgery Right 2014   COLONOSCOPY     ELBOW SURGERY Right 2013   POLYPECTOMY     removal disc neck  2015   SHOULDER SURGERY Right 1990   TOTAL HIP ARTHROPLASTY 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   Procedure: RIGHT TOTAL HIP ARTHROPLASTY ANTERIOR APPROACH;  Surgeon: Arnie Lao, MD;  Location: [facility];  Service: Orthopedics;  Laterality: Right;   Social History   Occupational History   Occupation: retired Clinical research associate term disability    Employer: RETIRED  Tobacco Use   Smoking status: Never   Smokeless tobacco: Never  Vaping Use   Vaping status: Never Used  Substance and Sexual Activity   Alcohol use: Yes    Alcohol/week: 0.0 standard drinks of alcohol    Comment: rare alcohol intake   Drug use: No   Sexual activity:  Not on file

## 2023-07-26 ENCOUNTER — Ambulatory Visit: Admitting: Family Medicine

## 2023-07-26 ENCOUNTER — Encounter: Payer: Self-pay | Admitting: Family Medicine

## 2023-07-26 DIAGNOSIS — Z0000 Encounter for general adult medical examination without abnormal findings: Secondary | ICD-10-CM | POA: Diagnosis not present

## 2023-07-26 NOTE — Progress Notes (Signed)
 Patient unable to obtain vital signs due to telehealth visit

## 2023-07-26 NOTE — Progress Notes (Signed)
 PATIENT CHECK-IN and HEALTH RISK ASSESSMENT QUESTIONNAIRE:  -completed by phone/video for upcoming Medicare Preventive Visit  Pre-Visit Check-in: 1)Vitals (height, wt, BP, etc) - record in vitals section for visit on day of visit Request home vitals (wt, BP, etc.) and enter into vitals, THEN update Vital Signs SmartPhrase below at the top of the HPI. See below.  2)Review and Update Medications, Allergies PMH, Surgeries, Social history in Epic 3)Hospitalizations in the last year with date/reason? Yes Hip replacement   4)Review and Update Care Team (patient's specialists) in Epic 5) Complete PHQ9 in Epic  6) Complete Fall Screening in Epic 7)Review all Health Maintenance Due and order under PCP if not done.  Medicare Wellness Patient Questionnaire:  Answer theses question about your habits: How often do you have a drink containing alcohol? > than monthly  How many drinks containing alcohol do you have on a typical day when you are drinking?2-3  How often do you have six or more drinks on one occasion?N/A How many packs a day do/did you smoke? N/A Do you use smokeless tobacco? No Do you use an illicit drugs? No On average, how many days per week do you engage in moderate to strenuous exercise (like a brisk walk)? N/A On average, how many minutes do you engage in exercise at this level? N/A  Are you sexually active?  No Number of partners? N/A Typical breakfast: Eggs and chicken  Typical lunch: Varies  Typical dinner: Varies  Typical snacks:  Fruit and vegetables   Beverages:  Water  Has 14 grandkids and 4 great grandkids, lots of good social connections  Answer theses question about your everyday activities: Can you perform most household chores? Mostly  Are you deaf or have significant trouble hearing? no Do you feel that you have a problem with memory? no Do you feel safe at home? Yes  Last dentist visit? 2 months ago 8. Do you have any difficulty performing your everyday  activities? no Are you having any difficulty walking, taking medications on your own, and or difficulty managing daily home needs?no Do you have difficulty walking or climbing stairs?Yes  Do you have difficulty dressing or bathing? no Do you have difficulty doing errands alone such as visiting a doctor's office or shopping? no Do you currently have any difficulty preparing food and eating?no Do you currently have any difficulty using the toilet?no Do you have any difficulty managing your finances?no Do you have any difficulties with housekeeping of managing your housekeeping?no   Do you have Advanced Directives in place (Living Will, Healthcare Power or Attorney)?  Yes    Last eye Exam and location? 1 year ago    Do you currently use prescribed or non-prescribed narcotic or opioid pain medications? No  Do you have a history or close family history of breast, ovarian, tubal or peritoneal cancer or a family member with BRCA (breast cancer susceptibility 1 and 2) gene mutations? no   Nurse/Assistant Credentials/time stamp: MG 10:10    ----------------------------------------------------------------------------------------------------------------------------------------------------------------------------------------------------------------------  Because this visit was a virtual/telehealth visit, some criteria may be missing or patient reported. Any vitals not documented were not able to be obtained and vitals that have been documented are patient reported.    MEDICARE ANNUAL PREVENTIVE CARE VISIT WITH PROVIDER (Welcome to Medicare, initial annual wellness or annual wellness exam)  Virtual Visit via Phone Note  I connected with Tommy Jackson on 07/26/23  by phone and verified that I am speaking with the correct person using two identifiers. He reports he  prefers to do a phone visit.   Location patient: home Location provider:work or home office Persons participating in the  virtual visit: patient, provider  Concerns and/or follow up today: reports had one year follow up from hip replacement and is doing much better. Is able to walk now. Admits doesn't exercise as much as he should.    See HM section in Epic for other details of completed HM.    ROS: negative for report of fevers, unintentional weight loss, vision changes, vision loss, hearing loss or change, chest pain, sob, hemoptysis, melena, hematochezia, hematuria, falls, bleeding or bruising, thoughts of suicide or self harm, memory loss  Patient-completed extensive health risk assessment - reviewed and discussed with the patient: See Health Risk Assessment completed with patient prior to the visit either above or in recent phone note. This was reviewed in detailed with the patient today and appropriate recommendations, orders and referrals were placed as needed per Summary below and patient instructions.   Review of Medical History: -PMH, PSH, Family History and current specialty and care providers reviewed and updated and listed below   Patient Care Team: Alto Atta, NP as PCP - General (Family Medicine) Pamella Boer, MD as Referring Physician (Neurosurgery) Sherlon Ditch, NP as Nurse Practitioner (Nurse Practitioner)   Past Medical History:  Diagnosis Date   Allergy    Arthritis    ASTHMA 09/07/2009   Diabetes mellitus without complication (HCC)    Herniated disc    HYPERLIPIDEMIA 02/07/2007   HYPERTENSION 11/29/2005   OBESITY 10/01/2007   Sleep apnea    cpap   SLEEP APNEA, OBSTRUCTIVE 10/01/2007    Past Surgical History:  Procedure Laterality Date   bicep surgery Right 2014   COLONOSCOPY     ELBOW SURGERY Right 2013   POLYPECTOMY     removal disc neck  2015   SHOULDER SURGERY Right 1990   TOTAL HIP ARTHROPLASTY 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   Procedure: RIGHT TOTAL HIP ARTHROPLASTY ANTERIOR APPROACH;  Surgeon: Arnie Lao, MD;  Location: [facility];  Service:  Orthopedics;  Laterality: Right;    Social History   Socioeconomic History   Marital status: Married    Spouse name: Not on file   Number of children: 3   Years of education: Not on file   Highest education level: Not on file  Occupational History   Occupation: retired truck driver,long term disability    Employer: RETIRED  Tobacco Use   Smoking status: Never   Smokeless tobacco: Never  Vaping Use   Vaping status: Never Used  Substance and Sexual Activity   Alcohol use: Yes    Alcohol/week: 0.0 standard drinks of alcohol    Comment: rare alcohol intake   Drug use: No   Sexual activity: Not on file  Other Topics Concern   Not on file  Social History Narrative   Not on file   Social Drivers of Health   Financial Resource Strain: Patient Declined (07/23/2023)   Overall Financial Resource Strain (CARDIA)    Difficulty of Paying Living Expenses: Patient declined  Food Insecurity: Patient Declined (07/23/2023)   Hunger Vital Sign    Worried About Running Out of Food in the Last Year: Patient declined    Ran Out of Food in the Last Year: Patient declined  Transportation Needs: Patient Declined (07/23/2023)   PRAPARE - Administrator, Civil Service (Medical): Patient declined    Lack of Transportation (Non-Medical): Patient declined  Physical Activity: Unknown (07/23/2023)  Exercise Vital Sign    Days of Exercise per Week: 0 days    Minutes of Exercise per Session: Not on file  Stress: No Stress Concern Present (07/23/2023)   [employer] of Occupational Health - Occupational Stress Questionnaire    Feeling of Stress : Not at all  Social Connections: Moderately Isolated (07/23/2023)   Social Connection and Isolation Panel [NHANES]    Frequency of Communication with Friends and Family: More than three times a week    Frequency of Social Gatherings with Friends and Family: Three times a week    Attends Religious Services: Never    Active Member of Clubs or  Organizations: No    Attends Banker Meetings: Not on file    Marital Status: Married  Catering manager Violence: Not At Risk (07/28/2022)   Humiliation, Afraid, Rape, and Kick questionnaire    Fear of Current or Ex-Partner: No    Emotionally Abused: No    Physically Abused: No    Sexually Abused: No    Family History  Problem Relation Age of Onset   Colon cancer Father 36   Lung cancer Father    CAD Father    Diabetes Father    Heart attack Father    Colon cancer Brother 41   Heart attack Paternal Grandfather    CAD Brother    Esophageal cancer Neg Hx    Stomach cancer Neg Hx    Rectal cancer Neg Hx     Current Outpatient Medications on File Prior to Visit  Medication Sig Dispense Refill   albuterol  (VENTOLIN  HFA) 108 (90 Base) MCG/ACT inhaler Inhale 2 puffs into the lungs every 4 (four) hours as needed. 6.7 g 3   amLODipine  (NORVASC ) 10 MG tablet TAKE 1 TABLET BY MOUTH EVERY DAY 90 tablet 0   Ascorbic Acid  (VITAMIN C ) 100 MG tablet Take 500 mg by mouth daily.     aspirin  81 MG chewable tablet Chew 1 tablet (81 mg total) by mouth 2 (two) times daily. 30 tablet 0   azelastine  (ASTELIN ) 0.1 % nasal spray Place 2 sprays into both nostrils daily as needed for allergies.     cetirizine (ZYRTEC) 10 MG tablet Take 10 mg by mouth daily as needed for allergies.     Dulaglutide  (TRULICITY ) 1.5 MG/0.5ML SOAJ Inject 1.5 mg into the skin once a week. 2 mL 0   fenofibrate  (TRICOR ) 145 MG tablet TAKE 1 TABLET BY MOUTH EVERY DAY 90 tablet 3   fluticasone  (FLONASE ) 50 MCG/ACT nasal spray Place 2 sprays into both nostrils daily as needed for allergies.     furosemide  (LASIX ) 20 MG tablet TAKE 1 TABLET BY MOUTH EVERY DAY 90 tablet 3   KLOR-CON  M10 10 MEQ tablet TAKE 1 TABLET BY MOUTH EVERY DAY 90 tablet 3   meloxicam (MOBIC) 15 MG tablet Take 1 tablet by mouth daily.     methocarbamol  (ROBAXIN ) 500 MG tablet Take 1 tablet (500 mg total) by mouth every 6 (six) hours as needed for  muscle spasms. 30 tablet 1   metoprolol  tartrate (LOPRESSOR ) 100 MG tablet TAKE 1 TABLET BY MOUTH TWICE A DAY 180 tablet 3   Multiple Vitamin (MULTIVITAMIN) capsule Take 1 capsule by mouth daily.     pravastatin  (PRAVACHOL ) 40 MG tablet TAKE 1 TABLET BY MOUTH EVERY DAY 90 tablet 3   valsartan -hydrochlorothiazide  (DIOVAN -HCT) 320-25 MG tablet TAKE 1 TABLET BY MOUTH EVERY DAY 90 tablet 3   No current facility-administered medications on file prior  to visit.    Allergies  Allergen Reactions   Aspirin      REACTION: unspecified   Glycerin Nausea Only   Penicillins     REACTION: family hx   Metformin  And Related Diarrhea       Physical Exam Vitals requested from patient and listed below if patient had equipment and was able to obtain at home for this virtual visit: There were no vitals filed for this visit. Estimated body mass index is 40.42 kg/m as calculated from the following:   Height as of 10/02/22: 6' (1.829 m).   Weight as of 10/02/22: 298 lb (135.2 kg).  EKG (optional): deferred due to virtual visit  GENERAL: alert, oriented, no acute distress detected; full vision exam deferred due to pandemic and/or virtual encounter  PSYCH/NEURO: pleasant and cooperative, no obvious depression or anxiety, speech and thought processing grossly intact, Cognitive function grossly intact  Flowsheet Row Clinical Support from 07/26/2023 in [facility] [place]  PHQ-9 Total Score 0           07/26/2023   10:01 AM 03/30/2022    3:54 PM 03/15/2022    1:20 PM 03/21/2021   10:35 AM 03/23/2020   11:44 AM  Depression screen PHQ 2/9  Decreased Interest 0 0 0 0 0  Down, Depressed, Hopeless 0 0 0 0 0  PHQ - 2 Score 0 0 0 0 0  Altered sleeping 0 0 0  0  Tired, decreased energy 0 0 0  0  Change in appetite 0 0 0  0  Feeling bad or failure about yourself  0 0 0  0  Trouble concentrating 0 0 0  0  Moving slowly or fidgety/restless 0 0 0  0  Suicidal thoughts 0 0 0  0  PHQ-9  Score 0 0 0  0  Difficult doing work/chores Not difficult at all Not difficult at all Not difficult at all  Not difficult at all       03/17/2021    9:24 AM 03/21/2021   10:35 AM 02/13/2022    3:57 PM 03/30/2022    3:56 PM 07/26/2023   10:02 AM  Fall Risk  Falls in the past year? 0 0 0 0 0  Was there an injury with Fall? 0   0 0  Fall Risk Category Calculator 0   0 0  Fall Risk Category (Retired) Low      Patient at Risk for Falls Due to  Medication side effect  No Fall Risks No Fall Risks  Fall risk Follow up  Falls evaluation completed;Education provided;Falls prevention discussed  Falls evaluation completed Falls evaluation completed     SUMMARY AND PLAN:  Encounter for Medicare annual wellness exam   Discussed applicable health maintenance/preventive health measures and advised and referred or ordered per patient preferences: -discussed vaccines due recs and risks, he declines covid vaccines, knows can get vaccines at the pharmacy and advised to let us  know if he does so that we can update his record -discussed colonoscopy and he plans to call GI to schedule -discussed labs due and he plans to see Randel Buss for that and prostate cancer screening Health Maintenance  Topic Date Due   DTaP/Tdap/Td (2 - Tdap) 06/09/2019   Diabetic kidney evaluation - Urine ACR  02/16/2023   Diabetic kidney evaluation - eGFR measurement  07/29/2023   Colonoscopy  08/29/2023   COVID-19 Vaccine (1 - 2024-25 season) 08/11/2023 (Originally 10/29/2022)   Zoster Vaccines- Shingrix (1 of  2) 06/28/2027 (Originally 07/16/2006)   INFLUENZA VACCINE  09/28/2023   Medicare Annual Wellness (AWV)  07/25/2024   Pneumonia Vaccine 40+ Years old  Completed   Hepatitis C Screening  Completed   HPV VACCINES  Aged Out   Meningococcal B Vaccine  Aged Out     Education and counseling on the following was provided based on the above review of health and a plan/checklist for the patient, along with additional information  discussed, was provided for the patient in the patient instructions :   -Advised and counseled on a healthy lifestyle - including the importance of a healthy diet, regular physical activity, social connections  -Reviewed patient's current diet. Advised and counseled on a whole foods based healthy diet. A summary of a healthy diet was provided in the Patient Instructions.  -reviewed patient's current physical activity level and discussed exercise guidelines for adults. Discussed community resources and ideas for safe exercise at home to assist in meeting exercise guideline recommendations in a safe and healthy way.  -Advise yearly dental visits at minimum and regular eye exams -Advised and counseled on alcohol safe limits, risks  Follow up: see patient instructions   Patient Instructions  I really enjoyed getting to talk with you today! I am available on Tuesdays and Thursdays for virtual visits if you have any questions or concerns, or if I can be of any further assistance.   CHECKLIST FROM ANNUAL WELLNESS VISIT:  -Follow up (please call to schedule if not scheduled after visit):   -call to schedule your annual physical with Randel Buss: [phone]   -yearly for annual wellness visit with primary care office  Here is a list of your preventive care/health maintenance measures and the plan for each if any are due:  PLAN For any measures below that may be due:   1.) Call to schedule your Colonoscopy - due in July: [phone]   2.) can do labs at in office physical  3.) can get vaccines at the pharmacy, if you do - please let us  know so that we can update your record  Health Maintenance  Topic Date Due   DTaP/Tdap/Td (2 - Tdap) 06/09/2019   COVID-19 Vaccine (1 - 2024-25 season) Never done   Diabetic kidney evaluation - Urine ACR  02/16/2023   Diabetic kidney evaluation - eGFR measurement  07/29/2023   Colonoscopy  08/29/2023   Zoster Vaccines- Shingrix (1 of 2) 06/28/2027 (Originally  07/16/2006)   INFLUENZA VACCINE  09/28/2023   Medicare Annual Wellness (AWV)  07/25/2024   Pneumonia Vaccine 26+ Years old  Completed   Hepatitis C Screening  Completed   HPV VACCINES  Aged Out   Meningococcal B Vaccine  Aged Out    -See a dentist at least yearly  -Get your eyes checked and then per your eye specialist's recommendations  -Other issues addressed today:   -I have included below further information regarding a healthy whole foods based diet, physical activity guidelines for adults, stress management and opportunities for social connections. I hope you find this information useful.   -----------------------------------------------------------------------------------------------------------------------------------------------------------------------------------------------------------------------------------------------------------    NUTRITION: -eat real food: lots of colorful vegetables (half the plate) and fruits -5-7 servings of vegetables and fruits per day (fresh or steamed is best), exp. 2 servings of vegetables with lunch and dinner and 2 servings of fruit per day. Berries and greens such as kale and collards are great choices.  -consume on a regular basis:  fresh fruits, fresh veggies, fish, nuts, seeds, healthy oils (such as  olive oil, avocado oil), whole grains (make sure for bread/pasta/crackers/etc., that the first ingredient on label contains the word "whole"), legumes. -can eat small amounts of dairy and lean meat (no larger than the palm of your hand), but avoid processed meats such as ham, bacon, lunch meat, etc. -drink water  -try to avoid fast food and pre-packaged foods, processed meat, ultra processed foods/beverages (donuts, candy, etc.) -most experts advise limiting sodium to < 2300mg  per day, should limit further is any chronic conditions such as high blood pressure, heart disease, diabetes, etc. The American Heart Association advised that < 1500mg  is is  ideal -try to avoid foods/beverages that contain any ingredients with names you do not recognize  -try to avoid foods/beverages  with added sugar or sweeteners/sweets  -try to avoid sweet drinks (including diet drinks): soda, juice, Gatorade, sweet tea, power drinks, diet drinks -try to avoid white rice, white bread, pasta (unless whole grain)  EXERCISE GUIDELINES FOR ADULTS: -if you wish to increase your physical activity, do so gradually and with the approval of your doctor -STOP and seek medical care immediately if you have any chest pain, chest discomfort or trouble breathing when starting or increasing exercise  -move and stretch your body, legs, feet and arms when sitting for long periods -Physical activity guidelines for optimal health in adults: -get at least 150 minutes per week of moderate exercise (can talk, but not sing); this is about 20-30 minutes of sustained activity 5-7 days per week or two 10-15 minute episodes of sustained activity 5-7 days per week -do some muscle building/resistance training/strength training at least 2 days per week  -balance exercises 3+ days per week:   Stand somewhere where you have something sturdy to hold onto if you lose balance    1) lift up on toes, then back down, start with 5x per day and work up to 20x   2) stand and lift one leg straight out to the side so that foot is a few inches of the floor, start with 5x each side and work up to 20x each side   3) stand on one foot, start with 5 seconds each side and work up to 20 seconds on each side  If you need ideas or help with getting more active:  -Silver sneakers https://tools.silversneakers.com  -Walk with a Doc: http://www.duncan-williams.com/  -try to include resistance (weight lifting/strength building) and balance exercises twice per week: or the following link for  ideas: http://castillo-powell.com/  BuyDucts.dk  STRESS MANAGEMENT: -can try meditating, or just sitting quietly with deep breathing while intentionally relaxing all parts of your body for 5 minutes daily -if you need further help with stress, anxiety or depression please follow up with your primary doctor or contact the wonderful folks at [facility]: [phone]  SOCIAL CONNECTIONS: -options in [place] if you wish to engage in more social and exercise related activities:  -Silver sneakers https://tools.silversneakers.com  -Walk with a Doc: http://www.duncan-williams.com/  -Check out the [facility] Active Adults 50+ section on the [place] of [employer] (hiking clubs, book clubs, cards and games, chess, exercise classes, aquatic classes and much more) - see the website for details: https://www.[place].gov/departments/parks-recreation/active-adults50  -YouTube has lots of exercise videos for different ages and abilities as well  -[facility] (a variety of indoor and outdoor inperson activities for adults). 249-212-7041. [address]  -Virtual Online Classes (a variety of topics): see seniorplanet.org or call ([phone])231-1429  -consider volunteering at a school, hospice center, church, senior center or elsewhere  Maurie Southern, DO

## 2023-07-26 NOTE — Patient Instructions (Addendum)
 I really enjoyed getting to talk with you today! I am available on Tuesdays and Thursdays for virtual visits if you have any questions or concerns, or if I can be of any further assistance.   CHECKLIST FROM ANNUAL WELLNESS VISIT:  -Follow up (please call to schedule if not scheduled after visit):   -call to schedule your annual physical with Randel Buss: [phone]   -yearly for annual wellness visit with primary care office  Here is a list of your preventive care/health maintenance measures and the plan for each if any are due:  PLAN For any measures below that may be due:   1.) Call to schedule your Colonoscopy - due in July: [phone]   2.) can do labs at in office physical  3.) can get vaccines at the pharmacy, if you do - please let us  know so that we can update your record  Health Maintenance  Topic Date Due   DTaP/Tdap/Td (2 - Tdap) 06/09/2019   COVID-19 Vaccine (1 - 2024-25 season) Never done   Diabetic kidney evaluation - Urine ACR  02/16/2023   Diabetic kidney evaluation - eGFR measurement  07/29/2023   Colonoscopy  08/29/2023   Zoster Vaccines- Shingrix (1 of 2) 06/28/2027 (Originally 07/16/2006)   INFLUENZA VACCINE  09/28/2023   Medicare Annual Wellness (AWV)  07/25/2024   Pneumonia Vaccine 4+ Years old  Completed   Hepatitis C Screening  Completed   HPV VACCINES  Aged Out   Meningococcal B Vaccine  Aged Out    -See a dentist at least yearly  -Get your eyes checked and then per your eye specialist's recommendations  -Other issues addressed today:   -I have included below further information regarding a healthy whole foods based diet, physical activity guidelines for adults, stress management and opportunities for social connections. I hope you find this information useful.    -----------------------------------------------------------------------------------------------------------------------------------------------------------------------------------------------------------------------------------------------------------    NUTRITION: -eat real food: lots of colorful vegetables (half the plate) and fruits -5-7 servings of vegetables and fruits per day (fresh or steamed is best), exp. 2 servings of vegetables with lunch and dinner and 2 servings of fruit per day. Berries and greens such as kale and collards are great choices.  -consume on a regular basis:  fresh fruits, fresh veggies, fish, nuts, seeds, healthy oils (such as olive oil, avocado oil), whole grains (make sure for bread/pasta/crackers/etc., that the first ingredient on label contains the word "whole"), legumes. -can eat small amounts of dairy and lean meat (no larger than the palm of your hand), but avoid processed meats such as ham, bacon, lunch meat, etc. -drink water  -try to avoid fast food and pre-packaged foods, processed meat, ultra processed foods/beverages (donuts, candy, etc.) -most experts advise limiting sodium to < 2300mg  per day, should limit further is any chronic conditions such as high blood pressure, heart disease, diabetes, etc. The American Heart Association advised that < 1500mg  is is ideal -try to avoid foods/beverages that contain any ingredients with names you do not recognize  -try to avoid foods/beverages  with added sugar or sweeteners/sweets  -try to avoid sweet drinks (including diet drinks): soda, juice, Gatorade, sweet tea, power drinks, diet drinks -try to avoid white rice, white bread, pasta (unless whole grain)  EXERCISE GUIDELINES FOR ADULTS: -if you wish to increase your physical activity, do so gradually and with the approval of your doctor -STOP and seek medical care immediately if you have any chest pain, chest discomfort or trouble breathing when starting or  increasing exercise  -  move and stretch your body, legs, feet and arms when sitting for long periods -Physical activity guidelines for optimal health in adults: -get at least 150 minutes per week of moderate exercise (can talk, but not sing); this is about 20-30 minutes of sustained activity 5-7 days per week or two 10-15 minute episodes of sustained activity 5-7 days per week -do some muscle building/resistance training/strength training at least 2 days per week  -balance exercises 3+ days per week:   Stand somewhere where you have something sturdy to hold onto if you lose balance    1) lift up on toes, then back down, start with 5x per day and work up to 20x   2) stand and lift one leg straight out to the side so that foot is a few inches of the floor, start with 5x each side and work up to 20x each side   3) stand on one foot, start with 5 seconds each side and work up to 20 seconds on each side  If you need ideas or help with getting more active:  -Silver sneakers https://tools.silversneakers.com  -Walk with a Doc: http://www.duncan-williams.com/  -try to include resistance (weight lifting/strength building) and balance exercises twice per week: or the following link for ideas: http://castillo-powell.com/  BuyDucts.dk  STRESS MANAGEMENT: -can try meditating, or just sitting quietly with deep breathing while intentionally relaxing all parts of your body for 5 minutes daily -if you need further help with stress, anxiety or depression please follow up with your primary doctor or contact the wonderful folks at [facility]: [phone]  SOCIAL CONNECTIONS: -options in [place] if you wish to engage in more social and exercise related activities:  -Silver sneakers https://tools.silversneakers.com  -Walk with a Doc: http://www.duncan-williams.com/  -Check out the [facility] Active Adults 50+  section on the [place] of [employer] (hiking clubs, book clubs, cards and games, chess, exercise classes, aquatic classes and much more) - see the website for details: https://www.[place].gov/departments/parks-recreation/active-adults50  -YouTube has lots of exercise videos for different ages and abilities as well  -[facility] (a variety of indoor and outdoor inperson activities for adults). [phone]. [address]  -Virtual Online Classes (a variety of topics): see seniorplanet.org or call [phone]  -consider volunteering at a school, hospice center, church, senior center or elsewhere

## 2023-09-06 ENCOUNTER — Other Ambulatory Visit: Payer: Self-pay | Admitting: Adult Health

## 2023-09-06 DIAGNOSIS — I10 Essential (primary) hypertension: Secondary | ICD-10-CM

## 2023-09-20 ENCOUNTER — Encounter: Payer: Self-pay | Admitting: Adult Health

## 2023-09-20 ENCOUNTER — Ambulatory Visit: Admitting: Adult Health

## 2023-09-20 VITALS — BP 154/93 | HR 113 | Temp 98.2°F | Wt 313.8 lb

## 2023-09-20 DIAGNOSIS — I10 Essential (primary) hypertension: Secondary | ICD-10-CM

## 2023-09-20 DIAGNOSIS — S39012A Strain of muscle, fascia and tendon of lower back, initial encounter: Secondary | ICD-10-CM

## 2023-09-20 MED ORDER — METHYLPREDNISOLONE 4 MG PO TBPK: ORAL_TABLET | ORAL | 0 refills | Status: DC

## 2023-09-20 MED ORDER — TRAMADOL HCL 50 MG PO TABS: 50.0000 mg | ORAL_TABLET | Freq: Three times a day (TID) | ORAL | 0 refills | Status: AC | PRN

## 2023-09-20 MED ORDER — METAXALONE 800 MG PO TABS: 800.0000 mg | ORAL_TABLET | Freq: Three times a day (TID) | ORAL | 0 refills | Status: DC

## 2023-09-20 NOTE — Progress Notes (Signed)
 Subjective:    Patient ID: Tommy Jackson, male    DOB: [birth year], 68 y.o.   MRN: [id number]  HPI 68 year old male who  has a past medical history of Allergy, Arthritis, ASTHMA (09/07/2009), Diabetes mellitus without complication (HCC), Herniated disc, HYPERLIPIDEMIA (02/07/2007), HYPERTENSION (11/29/2005), OBESITY (10/01/2007), Sleep apnea, and SLEEP APNEA, OBSTRUCTIVE (10/01/2007).  He presents to the office today for low back pain.   He has known chronic back pain feels as though this back pain that he has been experiencing for at least the last 2 weeks is different.  Pain is not over his spine were not he normally has back pain but off to the left side.  Pain feels as though it is a jabbing pain.  He may have injured his back lifting furniture or pushing a couch on and off a truck bed.  At home he has tried Robaxin , lidocaine  patches, and Voltaren which have not helped.  He has also done massage therapy which helped initially but then stopped working, heat seems to help and he did take a oxycodone  that he had leftover earlier today which helped but the pain eventually came back.  Movement and changing positions cause worsening pain but pain is pretty constant except when he is laying on his back on a heating pad.    Review of Systems See HPI   Past Medical History:  Diagnosis Date   Allergy    Arthritis    ASTHMA 09/07/2009   Diabetes mellitus without complication (HCC)    Herniated disc    HYPERLIPIDEMIA 02/07/2007   HYPERTENSION 11/29/2005   OBESITY 10/01/2007   Sleep apnea    cpap   SLEEP APNEA, OBSTRUCTIVE 10/01/2007    Social History   Socioeconomic History   Marital status: Married    Spouse name: Not on file   Number of children: 3   Years of education: Not on file   Highest education level: Not on file  Occupational History   Occupation: retired truck driver,long term disability    Employer: RETIRED  Tobacco Use   Smoking status: Never   Smokeless  tobacco: Never  Vaping Use   Vaping status: Never Used  Substance and Sexual Activity   Alcohol use: Yes    Alcohol/week: 0.0 standard drinks of alcohol    Comment: rare alcohol intake   Drug use: No   Sexual activity: Not on file  Other Topics Concern   Not on file  Social History Narrative   Not on file   Social Drivers of Health   Financial Resource Strain: Patient Declined (07/23/2023)   Overall Financial Resource Strain (CARDIA)    Difficulty of Paying Living Expenses: Patient declined  Food Insecurity: Patient Declined (07/23/2023)   Hunger Vital Sign    Worried About Running Out of Food in the Last Year: Patient declined    Ran Out of Food in the Last Year: Patient declined  Transportation Needs: Patient Declined (07/23/2023)   PRAPARE - Administrator, Civil Service (Medical): Patient declined    Lack of Transportation (Non-Medical): Patient declined  Physical Activity: Unknown (07/23/2023)   Exercise Vital Sign    Days of Exercise per Week: 0 days    Minutes of Exercise per Session: Not on file  Stress: No Stress Concern Present (07/23/2023)   [employer] of Occupational Health - Occupational Stress Questionnaire    Feeling of Stress : Not at all  Social Connections: Moderately Isolated (07/23/2023)   Social Connection and  Isolation Panel    Frequency of Communication with Friends and Family: More than three times a week    Frequency of Social Gatherings with Friends and Family: Three times a week    Attends Religious Services: Never    Active Member of Clubs or Organizations: No    Attends Banker Meetings: Not on file    Marital Status: Married  Intimate Partner Violence: Not At Risk (07/28/2022)   Humiliation, Afraid, Rape, and Kick questionnaire    Fear of Current or Ex-Partner: No    Emotionally Abused: No    Physically Abused: No    Sexually Abused: No    Past Surgical History:  Procedure Laterality Date   bicep surgery Right  2014   COLONOSCOPY     ELBOW SURGERY Right 2013   POLYPECTOMY     removal disc neck  2015   SHOULDER SURGERY Right 1990   TOTAL HIP ARTHROPLASTY 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   Procedure: RIGHT TOTAL HIP ARTHROPLASTY ANTERIOR APPROACH;  Surgeon: Vernetta Lonni GRADE, MD;  Location: [facility];  Service: Orthopedics;  Laterality: Right;    Family History  Problem Relation Age of Onset   Colon cancer Father 51   Lung cancer Father    CAD Father    Diabetes Father    Heart attack Father    Colon cancer Brother 100   Heart attack Paternal Grandfather    CAD Brother    Esophageal cancer Neg Hx    Stomach cancer Neg Hx    Rectal cancer Neg Hx     Allergies  Allergen Reactions   Aspirin      REACTION: unspecified   Glycerin Nausea Only   Penicillins     REACTION: family hx   Metformin  And Related Diarrhea    Current Outpatient Medications on File Prior to Visit  Medication Sig Dispense Refill   albuterol  (VENTOLIN  HFA) 108 (90 Base) MCG/ACT inhaler Inhale 2 puffs into the lungs every 4 (four) hours as needed. 6.7 g 3   amLODipine  (NORVASC ) 10 MG tablet TAKE 1 TABLET BY MOUTH EVERY DAY 90 tablet 0   Ascorbic Acid  (VITAMIN C ) 100 MG tablet Take 500 mg by mouth daily.     aspirin  81 MG chewable tablet Chew 1 tablet (81 mg total) by mouth 2 (two) times daily. 30 tablet 0   azelastine  (ASTELIN ) 0.1 % nasal spray Place 2 sprays into both nostrils daily as needed for allergies.     cetirizine (ZYRTEC) 10 MG tablet Take 10 mg by mouth daily as needed for allergies.     Dulaglutide  (TRULICITY ) 1.5 MG/0.5ML SOAJ Inject 1.5 mg into the skin once a week. 2 mL 0   fenofibrate  (TRICOR ) 145 MG tablet TAKE 1 TABLET BY MOUTH EVERY DAY 90 tablet 3   fluticasone  (FLONASE ) 50 MCG/ACT nasal spray Place 2 sprays into both nostrils daily as needed for allergies.     furosemide  (LASIX ) 20 MG tablet TAKE 1 TABLET BY MOUTH EVERY DAY 90 tablet 3   KLOR-CON  M10 10 MEQ tablet TAKE 1 TABLET BY MOUTH EVERY DAY 90 tablet 3    meloxicam (MOBIC) 15 MG tablet Take 1 tablet by mouth daily.     methocarbamol  (ROBAXIN ) 500 MG tablet Take 1 tablet (500 mg total) by mouth every 6 (six) hours as needed for muscle spasms. 30 tablet 1   metoprolol  tartrate (LOPRESSOR ) 100 MG tablet TAKE 1 TABLET BY MOUTH TWICE A DAY 180 tablet 3   Multiple Vitamin (MULTIVITAMIN) capsule Take  1 capsule by mouth daily.     pravastatin  (PRAVACHOL ) 40 MG tablet TAKE 1 TABLET BY MOUTH EVERY DAY 90 tablet 3   valsartan -hydrochlorothiazide  (DIOVAN -HCT) 320-25 MG tablet TAKE 1 TABLET BY MOUTH EVERY DAY 90 tablet 3   No current facility-administered medications on file prior to visit.    BP (!) 154/93 (BP Location: Left Arm, Patient Position: Sitting, Cuff Size: Large)   Pulse (!) 113   Temp 98.2 F (36.8 C) (Oral)   Wt (!) 313 lb 12.8 oz (142.3 kg)   SpO2 (!) 75%   BMI 42.56 kg/m       Objective:   Physical Exam Vitals and nursing note reviewed.  Constitutional:      Appearance: Normal appearance.  Cardiovascular:     Rate and Rhythm: Normal rate and regular rhythm.     Pulses: Normal pulses.     Heart sounds: Normal heart sounds.  Pulmonary:     Effort: Pulmonary effort is normal.     Breath sounds: Normal breath sounds.  Musculoskeletal:     Lumbar back: Tenderness present. No bony tenderness. Decreased range of motion.       Back:     Comments: Lower paraspinal muscles were tight with palpation, multiple knots felt.  Skin:    General: Skin is warm and dry.  Neurological:     General: No focal deficit present.     Mental Status: He is alert and oriented to person, place, and time.  Psychiatric:        Mood and Affect: Mood normal.        Behavior: Behavior normal.        Thought Content: Thought content normal.        Judgment: Judgment normal.       Assessment & Plan:  1. Strain of muscle, fascia and tendon of lower back, initial encounter (Primary) -Peers to be muscular in origin.  In the past Flexeril has caused  him to become very sleepy, I will try Skelaxin  as a muscle relaxer with him.  Also a Medrol  Dosepak and tramadol .  Consider physical therapy if this is not improving. - metaxalone  (SKELAXIN ) 800 MG tablet; Take 1 tablet (800 mg total) by mouth 3 (three) times daily.  Dispense: 30 tablet; Refill: 0 - methylPREDNISolone  (MEDROL  DOSEPAK) 4 MG TBPK tablet; Take as directed  Dispense: 21 tablet; Refill: 0 - traMADol  (ULTRAM ) 50 MG tablet; Take 1 tablet (50 mg total) by mouth every 8 (eight) hours as needed for up to 5 days.  Dispense: 15 tablet; Refill: 0  2. Essential hypertension - Elevated today but possibly due to pain.  - Will continue to monitor   Darleene Shape, NP

## 2023-09-26 ENCOUNTER — Telehealth: Payer: Self-pay | Admitting: Adult Health

## 2023-09-26 DIAGNOSIS — S39012A Strain of muscle, fascia and tendon of lower back, initial encounter: Secondary | ICD-10-CM

## 2023-09-26 MED ORDER — CYCLOBENZAPRINE HCL 10 MG PO TABS: 10.0000 mg | ORAL_TABLET | Freq: Every day | ORAL | 0 refills | Status: DC

## 2023-09-26 NOTE — Telephone Encounter (Signed)
 Spoke with the patient about his left-sided low back pain.  He did take his Medrol  Dosepak and use muscle relaxer Skelaxin  and pain improved slightly but still pretty constant and severe.  He is wondering what he can do next.  I am going to try him on Flexeril  and send him to physical therapy

## 2023-10-11 NOTE — Therapy (Signed)
 OUTPATIENT PHYSICAL THERAPY THORACOLUMBAR EVALUATION   Patient Name: Juriel Cid MRN: [id number] DOB:May 11, 1955, 68 y.o., male Today's Date: 10/15/2023  END OF SESSION:  PT End of Session - 10/15/23 0847     Visit Number 1    Date for PT Re-Evaluation 12/10/23    Authorization Type Medicare and [employer]    Progress Note Due on Visit 10    PT Start Time 0847    PT Stop Time [phone]    PT Time Calculation (min) 49 min    Activity Tolerance Patient tolerated treatment well    Behavior During Therapy [facility] for tasks assessed/performed          Past Medical History:  Diagnosis Date   Allergy    Arthritis    ASTHMA 09/07/2009   Diabetes mellitus without complication (HCC)    Herniated disc    HYPERLIPIDEMIA 02/07/2007   HYPERTENSION 11/29/2005   OBESITY 10/01/2007   Sleep apnea    cpap   SLEEP APNEA, OBSTRUCTIVE 10/01/2007   Past Surgical History:  Procedure Laterality Date   bicep surgery Right 2014   COLONOSCOPY     ELBOW SURGERY Right 2013   POLYPECTOMY     removal disc neck  2015   SHOULDER SURGERY Right 1990   TOTAL HIP ARTHROPLASTY 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   Procedure: RIGHT TOTAL HIP ARTHROPLASTY ANTERIOR APPROACH;  Surgeon: Vernetta Lonni GRADE, MD;  Location: [facility];  Service: Orthopedics;  Laterality: Right;   Patient Active Problem List   Diagnosis Date Noted   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   Prediabetes 02/18/2015   Left leg pain 09/01/2013   Left knee pain 09/01/2013   Left hip pain 08/04/2013   Allergic rhinitis 04/09/2013   Other abnormal glucose 10/24/2012   Dermatitis 05/07/2012   Cervical radiculopathy 02/22/2012   HERNIATED DISC 05/11/2010   Asthma 09/07/2009   Obesity 10/01/2007   Obstructive sleep apnea 10/01/2007   Dyslipidemia 02/07/2007   Essential hypertension 11/29/2005    PCP: Merna Huxley, NP   REFERRING PROVIDER: Merna Huxley, NP   REFERRING DIAG: [phone] (ICD-10-CM) - Strain of muscle, fascia  and tendon of lower back, initial encounter   RATIONALE FOR EVALUATION AND TREATMENT: Rehabilitation  THERAPY DIAG:  Other low back pain  Cramp and spasm  Difficulty in walking, not elsewhere classified  Abnormal posture  ONSET DATE: ~6 wks   NEXT MD VISIT: None currently scheduled     SUBJECTIVE:  SUBJECTIVE STATEMENT:  Pt states that he has began to have L sided back pain. He recalls no specific reason for his pain. He has a prior injury to his back and states that he does not think this is the same as it feels more muscular to him.   PERTINENT HISTORY:  PMH: arthritis, asthma, DM, hyperlipidemia, HTN, R total hip replacement, R biceps repair, obesity, OSA  PAIN:  Are you having pain? Yes: NPRS scale: 6/10, 8/10 (worst) Pain location: L side on his belt line  Pain description: like my back is in a vice, constant Aggravating factors: standing, walking, any moving  Relieving factors: lying flat/supine, holding grandchild   PRECAUTIONS: None  RED FLAGS: None   WEIGHT BEARING RESTRICTIONS: No  FALLS:  Has patient fallen in last 6 months? No  LIVING ENVIRONMENT: Lives with: lives with their spouse Lives in: House/apartment Stairs: No Has following equipment at home: Single point cane and Grab bars  OCCUPATION: Retired, Disabled   PLOF: Independent with basic ADLs and Leisure: watching tv   PATIENT GOALS: Get the pain to subside or go away    OBJECTIVE:  Note: Objective measures were completed at Evaluation unless otherwise noted.  DIAGNOSTIC FINDINGS:  07/09/23- XR Hip UNILATERAL W/ OR W/O PELVIS 2-3 VIEWS RIGHT  Impression: AP pelvis lateral view right hip: Bilateral hips well located.  Right total hip arthroplasty components well-seated no complicating features.  No acute  fractures or acute findings   PATIENT SURVEYS:  Modified Oswestry Low Back Pain Disability Questionnaire: 29 / 50 = 58.0 % (Severe Disability)  COGNITION: Overall cognitive status: Within functional limits for tasks assessed     SENSATION: Noted some numbness in the L thigh most of the time   MUSCLE LENGTH: Hamstrings: Right mod tight; Left mod tight IR: L-severe tight; p! R-mod tight ER: B Mod tight(p! On L side)   POSTURE: rounded shoulders, forward head, flexed trunk , and weight shift right  PALPATION: TTP: Medial adductors, L SIJ Joint (palpable knot/plica-pt reports this is where he feels his pain)   LUMBAR ROM:   AROM eval  Flexion 50% limited  Extension 25% limited  Right lateral flexion Hand on proximal  thigh  Left lateral flexion Hand on distal thigh  Right rotation WFL; p!  Left rotation WFL: p!   (Blank rows = not tested)  LOWER EXTREMITY ROM:    Grossly WFL; overall stiffened, slower movement   LOWER EXTREMITY MMT:    MMT Right eval Left eval  Hip flexion 4- 4-; p!  Hip extension 4- 4-; p!  Hip abduction 4-; p 4-; p  Hip adduction 4- 4-  Hip internal rotation 3+ p! 4- p!  Hip external rotation 4- 4-; p!  Knee flexion 4+ 4+  Knee extension 5 5+  Ankle dorsiflexion 5 5  Ankle plantarflexion    Ankle inversion    Ankle eversion     (Blank rows = not tested)  LUMBAR SPECIAL TESTS:  Not assessed   FUNCTIONAL TESTS:  5 times sit to stand: 37.0 sec  GAIT: Distance walked: clinic distance Assistive device utilized: None Level of assistance: Complete Independence Comments: Pt displays antalgic gait pattern w/ R weight shift throughout gait.   TREATMENT DATE:  10/15/23 SELF CARE:  Reviewed eval findings and role of PT in addressing identified deficits as well as instruction in initial HEP (see below).    PATIENT  EDUCATION:  Education details: PT eval findings, anticipated POC, initial HEP, role of TPDN, TPDN rational, procedure, outcomes, potential side effects, and recommended post-treatment exercises/activity, and fee schedule for TPDN and lack of insurance coverage requiring payment at time of service  Person educated: Patient Education method: Explanation, Demonstration, Verbal cues, Tactile cues, and MedBridge[employer] app access provided Education comprehension: verbalized understanding, returned demonstration, verbal cues required, tactile cues required, and needs further education   HOME EXERCISE PROGRAM: Access Code: [email or web address] URL: [email or web address] Date: 10/15/2023 Prepared by: Eusebio Saba   Exercises - Supine Lower Trunk Rotation  - 1 x daily - 7 x weekly - 1 sets - 5 reps - 30s hold - Supine Single Knee to Chest Stretch  - 1 x daily - 7 x weekly - 2 sets - 10 reps - 10s hold - Supine Hamstring Stretch with Strap  - 1 x daily - 7 x weekly - 3 reps - 30s hold   ASSESSMENT:  CLINICAL IMPRESSION: Mouhamed Cupcake Surowiec is a 68 y/o M referred to PT for evaluation and treatment of low back pain. Geramy states he has L sided low back pain that has been worsening over the last 6 weeks or so. He is having difficulty with any moving especially standing. Pt ambulates independently with a significant lateral shift to the R side which remains in sitting as well. Pt says his L lower back is where his pain is so, his positioning is consistent with his pain. Today's assessment revealed BLE weakness and pain, dec'd flexibility, and TTP at the L SIJ with palpable knot or possible plica in the area, states this is his area of pain with periodic numbness to the anterior thigh. Pt is also TTP at the medial knee/pes anserine insertion. Noted inc'd difficulty with bed mobility and transfers as well today. Pt completed 5xSTS in 37.0 sec indicating fall risk at this time, noted that with standing pt  has pain so, this slowed down his movement during. Green scored a 29 / 50 on the Modified Oswestry for low back pain, representing severe disability due to his pain. Nataniel will benefit from skilled PT intervention to address deficits found today to improve mobility, function, and activity tolerance with dec'd pain interference.   OBJECTIVE IMPAIRMENTS: Abnormal gait, decreased activity tolerance, decreased balance, decreased endurance, decreased knowledge of condition, decreased mobility, difficulty walking, decreased ROM, decreased strength, hypomobility, increased fascial restrictions, impaired perceived functional ability, increased muscle spasms, impaired flexibility, improper body mechanics, postural dysfunction, obesity, and pain.   ACTIVITY LIMITATIONS: carrying, lifting, bending, sitting, standing, squatting, stairs, transfers, bed mobility, locomotion level, and caring for others  PARTICIPATION LIMITATIONS: driving, shopping, and community activity  PERSONAL FACTORS: Age, Past/current experiences, Time since onset of injury/illness/exacerbation, and 3+ comorbidities: PMH: arthritis, asthma, DM, hyperlipidemia, HTN, R total hip replacement, R biceps repair, obesity, OSA are also affecting patient's functional outcome.   REHAB POTENTIAL: Good  CLINICAL DECISION MAKING: Evolving/moderate complexity  EVALUATION COMPLEXITY: Moderate   GOALS: Goals reviewed with patient? Yes  SHORT TERM GOALS: Target date: 11/05/2023   Patient will be independent with initial HEP.  Baseline: Goal status: INITIAL  2.  Patient will report at least 15-20% improvement in low back pain for improved QoL.  Baseline: 6/10, 8/10 (worst) Goal status: INITIAL  3.  Patient will be able to tolerate at  least 15 minutes of standing without pain interference.  Baseline: per modified Oswestry - I avoid standing because it increases my pain right away.  Goal status: INITIAL   LONG TERM GOALS: Target date:  12/10/2023   Patient will be independent with advanced HEP.  Baseline:  Goal status: INITIAL  2.  Patient will report at least 50% improvement in low back pain for improve QoL.  Baseline: 6/10, 8/10 (worst) Goal status: INITIAL  3.  Patient will improve his Modified Oswestry score by at least 10 points to decrease level of disability d/t pain.  Baseline: 29/50 Goal status: INITIAL  4.  Patient will perform 5xSTS in under 20s to decrease fall risk and improve LE strength.  Baseline: 37s at eval Goal status: INITIAL  5.  Patient will increase BLE strength grossly to 4+/5 to improve endurance and activity tolerance.  Baseline: MMT chart above Goal status: INITIAL   PLAN:  PT FREQUENCY: 2x/week  PT DURATION: 6-8 weeks  PLANNED INTERVENTIONS: [id number]- PT Re-evaluation, 97750- Physical Performance Testing, 97110-Therapeutic exercises, 97530- Therapeutic activity, 97112- Neuromuscular re-education, 97535- Self Care, [id number]- Manual therapy, [phone]- Gait training, [phone]- Aquatic Therapy, [phone]- Electrical stimulation (unattended), [id number]- Ultrasound, [id number]- Traction (mechanical), [id number]- Ionotophoresis 4mg /ml Dexamethasone , 79439 (1-2 muscles), [id number] (3+ muscles)- Dry Needling, Patient/Family education, Balance training, Stair training, Taping, Joint mobilization, Spinal mobilization, Cryotherapy, and Moist heat.  PLAN FOR NEXT SESSION: Assess gait via FGA; review/update initial HEP; gentle stretching and strengthening for lower back and LE; Possible manual therapy STM to lumbar paraspinals, SIJ, L adductors; Postural education?    Clarence Cogswell Jerrye, Student-PT 10/15/2023, 10:16 AM

## 2023-10-15 ENCOUNTER — Other Ambulatory Visit: Payer: Self-pay

## 2023-10-15 ENCOUNTER — Ambulatory Visit: Attending: Adult Health | Admitting: Physical Therapy

## 2023-10-15 ENCOUNTER — Encounter: Payer: Self-pay | Admitting: Physical Therapy

## 2023-10-15 DIAGNOSIS — R262 Difficulty in walking, not elsewhere classified: Secondary | ICD-10-CM | POA: Diagnosis not present

## 2023-10-15 DIAGNOSIS — R252 Cramp and spasm: Secondary | ICD-10-CM | POA: Insufficient documentation

## 2023-10-15 DIAGNOSIS — R293 Abnormal posture: Secondary | ICD-10-CM | POA: Insufficient documentation

## 2023-10-15 DIAGNOSIS — M5459 Other low back pain: Secondary | ICD-10-CM | POA: Insufficient documentation

## 2023-10-15 DIAGNOSIS — S39012A Strain of muscle, fascia and tendon of lower back, initial encounter: Secondary | ICD-10-CM | POA: Insufficient documentation

## 2023-10-17 ENCOUNTER — Ambulatory Visit: Admitting: Physical Therapy

## 2023-10-17 ENCOUNTER — Encounter: Payer: Self-pay | Admitting: Physical Therapy

## 2023-10-17 DIAGNOSIS — R252 Cramp and spasm: Secondary | ICD-10-CM | POA: Diagnosis not present

## 2023-10-17 DIAGNOSIS — M5459 Other low back pain: Secondary | ICD-10-CM

## 2023-10-17 DIAGNOSIS — R262 Difficulty in walking, not elsewhere classified: Secondary | ICD-10-CM | POA: Diagnosis not present

## 2023-10-17 DIAGNOSIS — R293 Abnormal posture: Secondary | ICD-10-CM | POA: Diagnosis not present

## 2023-10-17 DIAGNOSIS — S39012A Strain of muscle, fascia and tendon of lower back, initial encounter: Secondary | ICD-10-CM | POA: Diagnosis not present

## 2023-10-17 NOTE — Therapy (Signed)
 OUTPATIENT PHYSICAL THERAPY THORACOLUMBAR TREATMENT    Patient Name: Tommy Jackson MRN: [id number] DOB:1955-12-14, 68 y.o., male Today's Date: 10/17/2023  END OF SESSION:  PT End of Session - 10/17/23 1100     Visit Number 2    Date for PT Re-Evaluation 12/10/23    Authorization Type Medicare and [employer]    Progress Note Due on Visit 10    PT Start Time 1100    PT Stop Time 1146    PT Time Calculation (min) 46 min    Activity Tolerance Patient tolerated treatment well    Behavior During Therapy [facility] for tasks assessed/performed          Past Medical History:  Diagnosis Date   Allergy    Arthritis    ASTHMA 09/07/2009   Diabetes mellitus without complication (HCC)    Herniated disc    HYPERLIPIDEMIA 02/07/2007   HYPERTENSION 11/29/2005   OBESITY 10/01/2007   Sleep apnea    cpap   SLEEP APNEA, OBSTRUCTIVE 10/01/2007   Past Surgical History:  Procedure Laterality Date   bicep surgery Right 2014   COLONOSCOPY     ELBOW SURGERY Right 2013   POLYPECTOMY     removal disc neck  2015   SHOULDER SURGERY Right 1990   TOTAL HIP ARTHROPLASTY 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   Procedure: RIGHT TOTAL HIP ARTHROPLASTY ANTERIOR APPROACH;  Surgeon: Tommy Lonni GRADE, MD;  Location: [facility];  Service: Orthopedics;  Laterality: Right;   Patient Active Problem List   Diagnosis Date Noted   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   Prediabetes 02/18/2015   Left leg pain 09/01/2013   Left knee pain 09/01/2013   Left hip pain 08/04/2013   Allergic rhinitis 04/09/2013   Other abnormal glucose 10/24/2012   Dermatitis 05/07/2012   Cervical radiculopathy 02/22/2012   HERNIATED DISC 05/11/2010   Asthma 09/07/2009   Obesity 10/01/2007   Obstructive sleep apnea 10/01/2007   Dyslipidemia 02/07/2007   Essential hypertension 11/29/2005    PCP: Tommy Huxley, NP   REFERRING PROVIDER: Merna Huxley, NP   REFERRING DIAG: [phone] (ICD-10-CM) - Strain of muscle, fascia  and tendon of lower back, initial encounter   RATIONALE FOR EVALUATION AND TREATMENT: Rehabilitation  THERAPY DIAG:  Other low back pain  Cramp and spasm  Difficulty in walking, not elsewhere classified  Abnormal posture  ONSET DATE: ~6 wks   NEXT MD VISIT: None currently scheduled     SUBJECTIVE:  SUBJECTIVE STATEMENT:  Pt states he is a bit stiff today but, he is doing well. He has only been able to do his HEP once but, states the exercises did go well.   PERTINENT HISTORY:  PMH: arthritis, asthma, DM, hyperlipidemia, HTN, R total hip replacement, R biceps repair, obesity, OSA  PAIN:  Are you having pain? Yes: NPRS scale: 6/10, 8/10 (worst) Pain location: L side on his belt line  Pain description: like my back is in a vice, constant Aggravating factors: standing, walking, any moving  Relieving factors: lying flat/supine, holding grandchild   PRECAUTIONS: None  RED FLAGS: None   WEIGHT BEARING RESTRICTIONS: No  FALLS:  Has patient fallen in last 6 months? No  LIVING ENVIRONMENT: Lives with: lives with their spouse Lives in: House/apartment Stairs: No Has following equipment at home: Single point cane and Grab bars  OCCUPATION: Retired, Disabled   PLOF: Independent with basic ADLs and Leisure: watching tv   PATIENT GOALS: Get the pain to subside or go away    OBJECTIVE:  Note: Objective measures were completed at Evaluation unless otherwise noted.  DIAGNOSTIC FINDINGS:  07/09/23- XR Hip UNILATERAL W/ OR W/O PELVIS 2-3 VIEWS RIGHT  Impression: AP pelvis lateral view right hip: Bilateral hips well located.  Right total hip arthroplasty components well-seated no complicating features.  No acute fractures or acute findings   PATIENT SURVEYS:  Modified Oswestry Low Back  Pain Disability Questionnaire: 29 / 50 = 58.0 % (Severe Disability)  COGNITION: Overall cognitive status: Within functional limits for tasks assessed     SENSATION: Noted some numbness in the L thigh most of the time   MUSCLE LENGTH: Hamstrings: Right mod tight; Left mod tight IR: L-severe tight; p! R-mod tight ER: B Mod tight(p! On L side)   POSTURE: rounded shoulders, forward head, flexed trunk , and weight shift right  PALPATION: TTP: Medial adductors, L SIJ Joint (palpable knot/plica-[name])   LUMBAR ROM:   AROM eval  Flexion 50% limited  Extension 25% limited  Right lateral flexion Hand on proximal  thigh  Left lateral flexion Hand on distal thigh  Right rotation WFL; p!  Left rotation WFL: p!   (Blank rows = not tested)  LOWER EXTREMITY ROM:    Grossly WFL; overall stiffened, slower movement   LOWER EXTREMITY MMT:    MMT Right eval Left eval  Hip flexion 4- 4-; p!  Hip extension 4- 4-; p!  Hip abduction 4-; p 4-; p  Hip adduction 4- 4-  Hip internal rotation 3+ p! 4- p!  Hip external rotation 4- 4-; p!  Knee flexion 4+ 4+  Knee extension 5 5+  Ankle dorsiflexion 5 5  Ankle plantarflexion    Ankle inversion    Ankle eversion     (Blank rows = not tested)  LUMBAR SPECIAL TESTS:  Not assessed   FUNCTIONAL TESTS:  5 times sit to stand: 37.0 sec  GAIT: Distance walked: clinic distance Assistive device utilized: None Level of assistance: Complete Independence Comments: Pt displays antalgic gait pattern w/ R weight shift throughout gait.   TREATMENT DATE:  10/17/23 PHYSICAL PERFORMANCE TEST or MEASUREMENT: Functional Gait  Assessment  Gait assessed  Yes   Gait Level Surface Walks 20 ft, slow speed, abnormal gait pattern, evidence for imbalance or deviates 10-15 in outside of the 12 in walkway  width. Requires more than 7 sec to ambulate 20 ft.   Change in Gait Speed Makes only minor adjustments to walking speed, or accomplishes a change in speed with significant gait deviations, deviates 10-15 in outside the 12 in walkway width, or changes speed but loses balance but is able to recover and continue walking.   Gait with Horizontal Head Turns Performs head turns smoothly with slight change in gait velocity (eg, minor disruption to smooth gait path), deviates 6-10 in outside 12 in walkway width, or uses an assistive device.   Gait with Vertical Head Turns Performs task with moderate change in gait velocity, slows down, deviates 10-15 in outside 12 in walkway width but recovers, can continue to walk.   Gait and Pivot Turn Pivot turns safely in greater than 3 sec and stops with no loss of balance, or pivot turns safely within 3 sec and stops with mild imbalance, requires small steps to catch balance.   Step Over Obstacle Is able to step over one shoe box (4.5 in total height) but must slow down and adjust steps to clear box safely. May require verbal cueing.   Gait with Narrow Base of Support Ambulates less than 4 steps heel to toe or cannot perform without assistance.   Gait with Eyes Closed Walks 20 ft, uses assistive device, slower speed, mild gait deviations, deviates 6-10 in outside 12 in walkway width. Ambulates 20 ft in less than 9 sec but greater than 7 sec.   Ambulating Backwards Walks 20 ft, slow speed, abnormal gait pattern, evidence for imbalance, deviates 10-15 in outside 12 in walkway width.   Steps Alternating feet, must use rail.   Total Score 13      SELF CARE/THER-EX Reviewed pt's initial HEP:  Supine LTRs x10 B, 3-5  Supine SBKTC 2x30 B  Supine HS stretch with strap 2x30  Hooklying Diaphragmatic Breathing x10  Hooklying TrA+ Glute Sets x10 w/ breathing maintained  Hooklying TrA + Bridges x10 w/ breathing maintained  Supine Marches x10 B w/ cues to maintain breathing     10/15/23 SELF CARE:  Reviewed eval findings and role of PT in addressing identified deficits as well as instruction in initial HEP (see below).    PATIENT EDUCATION:  Education details: HEP update  Person educated: Patient Education method: Explanation, Demonstration, Verbal cues, and Tactile cues Education comprehension: verbalized understanding, returned demonstration, verbal cues required, tactile cues required, and needs further education   HOME EXERCISE PROGRAM: Access Code: [email or web address] URL: [email or web address] Date: 10/15/2023 Prepared by: Eusebio Saba   Exercises - Supine Lower Trunk Rotation  - 1 x daily - 7 x weekly - 1 sets - 5 reps - 30s hold - Supine Single Knee to Chest Stretch  - 1 x daily - 7 x weekly - 2 sets - 10 reps - 10s hold - Supine Hamstring Stretch with Strap  - 1 x daily - 7 x weekly - 3 reps - 30s hold   ASSESSMENT:  CLINICAL IMPRESSION: Tommy Jackson some stiffness in his L lower back. He states that he has only been able to do his HEP once but, says the exercises went okay. Performed FGA today, Tommy Jackson scored a 13/30 representing inc'd risk of falls at this time. Pt has  inc'd difficulty changing gait speed, adding head movements, performing a sudden pivot turn, and walking with a narrow based of support the most. With most activities of the FGA, patient needed to slow down gait speed to perform or needed to catch himself on the wall. Pt is able to negotiate stairs using a reciprocal pattern at a slower speed with a hand rail to ascend and descend stairs. Reviewed initial HEP and briefly introduced him to gentle core and LE strengthening. Noted that pt often holds his breath with most movements so, provided education and demonstration of diaphragmatic breathing. Pt performed remaining exercises requiring cuing to maintain his breathing pattern and avoid holding his breath through motions.   EVAL: Tommy Jackson is a 68 y/o M referred to  PT for evaluation and treatment of low back pain. Tommy Jackson states he has L sided low back pain that has been worsening over the last 6 weeks or so. He is having difficulty with any moving especially standing. Pt ambulates independently with a significant lateral shift to the R side which remains in sitting as well. Pt says his L lower back is where his pain is so, his positioning is consistent with his pain. Today's assessment revealed BLE weakness and pain, dec'd flexibility, and TTP at the L SIJ with palpable knot or possible plica in the area, states this is his area of pain with periodic numbness to the anterior thigh. Pt is also TTP at the medial knee/pes anserine insertion. Noted inc'd difficulty with bed mobility and transfers as well today. Pt completed 5xSTS in 37.0 sec indicating fall risk at this time, noted that with standing pt has pain so, this slowed down his movement during. Tommy Jackson scored a 29 / 50 on the Modified Oswestry for low back pain, representing severe disability due to his pain. Tommy Jackson will benefit from skilled PT intervention to address deficits found today to improve mobility, function, and activity tolerance with dec'd pain interference and dec'd risk of falls.   OBJECTIVE IMPAIRMENTS: Abnormal gait, decreased activity tolerance, decreased balance, decreased endurance, decreased knowledge of condition, decreased mobility, difficulty walking, decreased ROM, decreased strength, hypomobility, increased fascial restrictions, impaired perceived functional ability, increased muscle spasms, impaired flexibility, improper body mechanics, postural dysfunction, obesity, and pain.   ACTIVITY LIMITATIONS: carrying, lifting, bending, sitting, standing, squatting, stairs, transfers, bed mobility, locomotion level, and caring for others  PARTICIPATION LIMITATIONS: driving, shopping, and community activity  PERSONAL FACTORS: Age, Past/current experiences, Time since onset of injury/illness/exacerbation,  and 3+ comorbidities: PMH: arthritis, asthma, DM, hyperlipidemia, HTN, R total hip replacement, R biceps repair, obesity, OSA are also affecting patient's functional outcome.   REHAB POTENTIAL: Good  CLINICAL DECISION MAKING: Evolving/moderate complexity  EVALUATION COMPLEXITY: Moderate   GOALS: Goals reviewed with patient? Yes  SHORT TERM GOALS: Target date: 11/05/2023   Patient will be independent with initial HEP.  Baseline: Goal status: INITIAL  2.  Patient will report at least 15-20% improvement in low back pain for improved QoL.  Baseline: 6/10, 8/10 (worst) Goal status: INITIAL  3.  Patient will be able to tolerate at least 15 minutes of standing without pain interference.  Baseline: per modified Oswestry - I avoid standing because it increases my pain right away.  Goal status: INITIAL   LONG TERM GOALS: Target date: 12/10/2023   Patient will be independent with advanced HEP.  Baseline:  Goal status: INITIAL  2.  Patient will report at least 50% improvement in low back pain for improve QoL.  Baseline:  6/10, 8/10 (worst) Goal status: INITIAL  3.  Patient will improve his Modified Oswestry score by at least 10 points to decrease level of disability d/t pain.  Baseline: 29/50 Goal status: INITIAL  4.  Patient will perform 5xSTS in under 20s to decrease fall risk and improve LE strength.  Baseline: 37s at eval Goal status: INITIAL  5.  Patient will increase BLE strength grossly to 4+/5 to improve endurance and activity tolerance.  Baseline: MMT chart above Goal status: INITIAL  6. 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  Baseline: 13/30- see above Goal status: INITIAL   PLAN:  PT FREQUENCY: 2x/week  PT DURATION: 6-8 weeks  PLANNED INTERVENTIONS: 97164- PT Re-evaluation, 97750- Physical Performance Testing, 97110-Therapeutic exercises, 97530- Therapeutic activity,  97112- Neuromuscular re-education, 97535- Self Care, [id number]- Manual therapy, [phone]- Gait training, [phone]- Aquatic Therapy, [phone]- Electrical stimulation (unattended), [id number]- Ultrasound, [id number]- Traction (mechanical), [id number]- Ionotophoresis 4mg /ml Dexamethasone , [id number] (1-2 muscles), [id number] (3+ muscles)- Dry Needling, Patient/Family education, Balance training, Stair training, Taping, Joint mobilization, Spinal mobilization, Cryotherapy, and Moist heat.  PLAN FOR NEXT SESSION: provide fall prevention education; gentle stretching and strengthening for lower back and LE; incorporate balance activities; review/update HEP PRN; Possible manual therapy STM to lumbar paraspinals, SIJ, L adductors; Postural education?    Kanesha Cadle Jerrye, Student-PT 10/17/2023, 12:04 PM

## 2023-10-23 ENCOUNTER — Ambulatory Visit

## 2023-10-23 DIAGNOSIS — S39012A Strain of muscle, fascia and tendon of lower back, initial encounter: Secondary | ICD-10-CM | POA: Diagnosis not present

## 2023-10-23 DIAGNOSIS — M5459 Other low back pain: Secondary | ICD-10-CM | POA: Diagnosis not present

## 2023-10-23 DIAGNOSIS — R293 Abnormal posture: Secondary | ICD-10-CM | POA: Diagnosis not present

## 2023-10-23 DIAGNOSIS — R262 Difficulty in walking, not elsewhere classified: Secondary | ICD-10-CM

## 2023-10-23 DIAGNOSIS — R252 Cramp and spasm: Secondary | ICD-10-CM | POA: Diagnosis not present

## 2023-10-23 NOTE — Therapy (Signed)
 OUTPATIENT PHYSICAL THERAPY THORACOLUMBAR TREATMENT    Patient Name: Tommy Jackson MRN: [id number] DOB:11-10-1955, 68 y.o., male Today's Date: 10/23/2023  END OF SESSION:  PT End of Session - 10/23/23 0921     Visit Number 3    Date for PT Re-Evaluation 12/10/23    Authorization Type Medicare and [employer]    Progress Note Due on Visit 10    PT Start Time [id number]    PT Stop Time 0933    PT Time Calculation (min) 47 min    Activity Tolerance Patient tolerated treatment well    Behavior During Therapy [facility] for tasks assessed/performed           Past Medical History:  Diagnosis Date   Allergy    Arthritis    ASTHMA 09/07/2009   Diabetes mellitus without complication (HCC)    Herniated disc    HYPERLIPIDEMIA 02/07/2007   HYPERTENSION 11/29/2005   OBESITY 10/01/2007   Sleep apnea    cpap   SLEEP APNEA, OBSTRUCTIVE 10/01/2007   Past Surgical History:  Procedure Laterality Date   bicep surgery Right 2014   COLONOSCOPY     ELBOW SURGERY Right 2013   POLYPECTOMY     removal disc neck  2015   SHOULDER SURGERY Right 1990   TOTAL HIP ARTHROPLASTY 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   Procedure: RIGHT TOTAL HIP ARTHROPLASTY ANTERIOR APPROACH;  Surgeon: Vernetta Lonni GRADE, MD;  Location: [facility];  Service: Orthopedics;  Laterality: Right;   Patient Active Problem List   Diagnosis Date Noted   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   Prediabetes 02/18/2015   Left leg pain 09/01/2013   Left knee pain 09/01/2013   Left hip pain 08/04/2013   Allergic rhinitis 04/09/2013   Other abnormal glucose 10/24/2012   Dermatitis 05/07/2012   Cervical radiculopathy 02/22/2012   HERNIATED DISC 05/11/2010   Asthma 09/07/2009   Obesity 10/01/2007   Obstructive sleep apnea 10/01/2007   Dyslipidemia 02/07/2007   Essential hypertension 11/29/2005    PCP: Merna Huxley, NP   REFERRING PROVIDER: Merna Huxley, NP   REFERRING DIAG: [phone] (ICD-10-CM) - Strain of muscle,  fascia and tendon of lower back, initial encounter   RATIONALE FOR EVALUATION AND TREATMENT: Rehabilitation  THERAPY DIAG:  Other low back pain  Cramp and spasm  Difficulty in walking, not elsewhere classified  Abnormal posture  ONSET DATE: ~6 wks   NEXT MD VISIT: None currently scheduled     SUBJECTIVE:  SUBJECTIVE STATEMENT:  Pt reports no more belt line pain,  PERTINENT HISTORY:  PMH: arthritis, asthma, DM, hyperlipidemia, HTN, R total hip replacement, R biceps repair, obesity, OSA  PAIN:  Are you having pain? Yes: NPRS scale: 0/10, 8/10 (worst) Pain location: L side on his belt line  Pain description: like my back is in a vice, constant Aggravating factors: standing, walking, any moving  Relieving factors: lying flat/supine, holding grandchild   PRECAUTIONS: None  RED FLAGS: None   WEIGHT BEARING RESTRICTIONS: No  FALLS:  Has patient fallen in last 6 months? No  LIVING ENVIRONMENT: Lives with: lives with their spouse Lives in: House/apartment Stairs: No Has following equipment at home: Single point cane and Grab bars  OCCUPATION: Retired, Disabled   PLOF: Independent with basic ADLs and Leisure: watching tv   PATIENT GOALS: Get the pain to subside or go away    OBJECTIVE:  Note: Objective measures were completed at Evaluation unless otherwise noted.  DIAGNOSTIC FINDINGS:  07/09/23- XR Hip UNILATERAL W/ OR W/O PELVIS 2-3 VIEWS RIGHT  Impression: AP pelvis lateral view right hip: Bilateral hips well located.  Right total hip arthroplasty components well-seated no complicating features.  No acute fractures or acute findings   PATIENT SURVEYS:  Modified Oswestry Low Back Pain Disability Questionnaire: 29 / 50 = 58.0 % (Severe Disability)  COGNITION: Overall  cognitive status: Within functional limits for tasks assessed     SENSATION: Noted some numbness in the L thigh most of the time   MUSCLE LENGTH: Hamstrings: Right mod tight; Left mod tight IR: L-severe tight; p! R-mod tight ER: B Mod tight(p! On L side)   POSTURE: rounded shoulders, forward head, flexed trunk , and weight shift right  PALPATION: TTP: Medial adductors, L SIJ Joint (palpable knot/plica-pt reports this is where he feels his pain)   LUMBAR ROM:   AROM eval  Flexion 50% limited  Extension 25% limited  Right lateral flexion Hand on proximal  thigh  Left lateral flexion Hand on distal thigh  Right rotation WFL; p!  Left rotation WFL: p!   (Blank rows = not tested)  LOWER EXTREMITY ROM:    Grossly WFL; overall stiffened, slower movement   LOWER EXTREMITY MMT:    MMT Right eval Left eval  Hip flexion 4- 4-; p!  Hip extension 4- 4-; p!  Hip abduction 4-; p 4-; p  Hip adduction 4- 4-  Hip internal rotation 3+ p! 4- p!  Hip external rotation 4- 4-; p!  Knee flexion 4+ 4+  Knee extension 5 5+  Ankle dorsiflexion 5 5  Ankle plantarflexion    Ankle inversion    Ankle eversion     (Blank rows = not tested)  LUMBAR SPECIAL TESTS:  Not assessed   FUNCTIONAL TESTS:  5 times sit to stand: 37.0 sec  GAIT: Distance walked: clinic distance Assistive device utilized: None Level of assistance: Complete Independence Comments: Pt displays antalgic gait pattern w/ R weight shift throughout gait.   TREATMENT DATE:  10/23/23 Seated lumbar flexion green pball 2x1' each Supine LTRs x10 B, 3-5  Hooklying TrA + Bridges x15 w/ breathing maintained  Supine Marches x20 B w/ cues to maintain breathing  Supine bent knee fallout Blue TB 10x3 B Standing overhead lat stretch at ladder x 1 min each side Standing trunk rotations holding onto ladder x 10  both sides  10/17/23 PHYSICAL PERFORMANCE TEST or MEASUREMENT: Functional Gait  Assessment  Gait assessed  Yes   Gait Level Surface Walks 20 ft, slow speed, abnormal gait pattern, evidence for imbalance or deviates 10-15 in outside of the 12 in walkway width. Requires more than 7 sec to ambulate 20 ft.   Change in Gait Speed Makes only minor adjustments to walking speed, or accomplishes a change in speed with significant gait deviations, deviates 10-15 in outside the 12 in walkway width, or changes speed but loses balance but is able to recover and continue walking.   Gait with Horizontal Head Turns Performs head turns smoothly with slight change in gait velocity (eg, minor disruption to smooth gait path), deviates 6-10 in outside 12 in walkway width, or uses an assistive device.   Gait with Vertical Head Turns Performs task with moderate change in gait velocity, slows down, deviates 10-15 in outside 12 in walkway width but recovers, can continue to walk.   Gait and Pivot Turn Pivot turns safely in greater than 3 sec and stops with no loss of balance, or pivot turns safely within 3 sec and stops with mild imbalance, requires small steps to catch balance.   Step Over Obstacle Is able to step over one shoe box (4.5 in total height) but must slow down and adjust steps to clear box safely. May require verbal cueing.   Gait with Narrow Base of Support Ambulates less than 4 steps heel to toe or cannot perform without assistance.   Gait with Eyes Closed Walks 20 ft, uses assistive device, slower speed, mild gait deviations, deviates 6-10 in outside 12 in walkway width. Ambulates 20 ft in less than 9 sec but greater than 7 sec.   Ambulating Backwards Walks 20 ft, slow speed, abnormal gait pattern, evidence for imbalance, deviates 10-15 in outside 12 in walkway width.   Steps Alternating feet, must use rail.   Total Score 13      SELF CARE/THER-EX Reviewed pt's initial HEP:  Supine LTRs x10 B, 3-5   Supine SBKTC 2x30 B  Supine HS stretch with strap 2x30  Hooklying Diaphragmatic Breathing x10  Hooklying TrA+ Glute Sets x10 w/ breathing maintained  Hooklying TrA + Bridges x10 w/ breathing maintained  Supine Marches x10 B w/ cues to maintain breathing    10/15/23 SELF CARE:  Reviewed eval findings and role of PT in addressing identified deficits as well as instruction in initial HEP (see below).    PATIENT EDUCATION:  Education details: HEP update  Person educated: Patient Education method: Explanation, Demonstration, Verbal cues, and Tactile cues Education comprehension: verbalized understanding, returned demonstration, verbal cues required, tactile cues required, and needs further education   HOME EXERCISE PROGRAM: Access Code: [email or web address] URL: [email or web address] Date: 10/15/2023 Prepared by: Tommy Jackson   Exercises - Supine Lower Trunk Rotation  - 1 x daily - 7 x weekly - 1 sets - 5 reps - 30s hold - Supine Single Knee to Chest Stretch  - 1 x daily - 7 x weekly - 2 sets - 10 reps - 10s hold - Supine Hamstring Stretch with Strap  - 1  x daily - 7 x weekly - 3 reps - 30s hold   ASSESSMENT:  CLINICAL IMPRESSION: Pt responded well to treatment. Advanced stretching and stabilization for lumbar spine to tolerance. Worked on increasing mobility along the spine as well.   EVAL: Tommy Jackson is a 68 y/o M referred to PT for evaluation and treatment of low back pain. Tommy Jackson states he has L sided low back pain that has been worsening over the last 6 weeks or so. He is having difficulty with any moving especially standing. Pt ambulates independently with a significant lateral shift to the R side which remains in sitting as well. Pt says his L lower back is where his pain is so, his positioning is consistent with his pain. Today's assessment revealed BLE weakness and pain, dec'd flexibility, and TTP at the L SIJ with palpable knot or possible plica in the  area, states this is his area of pain with periodic numbness to the anterior thigh. Pt is also TTP at the medial knee/pes anserine insertion. Noted inc'd difficulty with bed mobility and transfers as well today. Pt completed 5xSTS in 37.0 sec indicating fall risk at this time, noted that with standing pt has pain so, this slowed down his movement during. Tommy Jackson scored a 29 / 50 on the Modified Oswestry for low back pain, representing severe disability due to his pain. Tommy Jackson will benefit from skilled PT intervention to address deficits found today to improve mobility, function, and activity tolerance with dec'd pain interference and dec'd risk of falls.   OBJECTIVE IMPAIRMENTS: Abnormal gait, decreased activity tolerance, decreased balance, decreased endurance, decreased knowledge of condition, decreased mobility, difficulty walking, decreased ROM, decreased strength, hypomobility, increased fascial restrictions, impaired perceived functional ability, increased muscle spasms, impaired flexibility, improper body mechanics, postural dysfunction, obesity, and pain.   ACTIVITY LIMITATIONS: carrying, lifting, bending, sitting, standing, squatting, stairs, transfers, bed mobility, locomotion level, and caring for others  PARTICIPATION LIMITATIONS: driving, shopping, and community activity  PERSONAL FACTORS: Age, Past/current experiences, Time since onset of injury/illness/exacerbation, and 3+ comorbidities: PMH: arthritis, asthma, DM, hyperlipidemia, HTN, R total hip replacement, R biceps repair, obesity, OSA are also affecting patient's functional outcome.   REHAB POTENTIAL: Good  CLINICAL DECISION MAKING: Evolving/moderate complexity  EVALUATION COMPLEXITY: Moderate   GOALS: Goals reviewed with patient? Yes  SHORT TERM GOALS: Target date: 11/05/2023   Patient will be independent with initial HEP.  Baseline: Goal status: INITIAL  2.  Patient will report at least 15-20% improvement in low back pain for  improved QoL.  Baseline: 6/10, 8/10 (worst) Goal status: INITIAL  3.  Patient will be able to tolerate at least 15 minutes of standing without pain interference.  Baseline: per modified Oswestry - I avoid standing because it increases my pain right away.  Goal status: INITIAL   LONG TERM GOALS: Target date: 12/10/2023   Patient will be independent with advanced HEP.  Baseline:  Goal status: INITIAL  2.  Patient will report at least 50% improvement in low back pain for improve QoL.  Baseline: 6/10, 8/10 (worst) Goal status: INITIAL  3.  Patient will improve his Modified Oswestry score by at least 10 points to decrease level of disability d/t pain.  Baseline: 29/50 Goal status: INITIAL  4.  Patient will perform 5xSTS in under 20s to decrease fall risk and improve LE strength.  Baseline: 37s at eval Goal status: INITIAL  5.  Patient will increase BLE strength grossly to 4+/5 to improve endurance and activity tolerance.  Baseline: MMT chart above Goal status: INITIAL  6. 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  Baseline: 13/30- see above Goal status: INITIAL   PLAN:  PT FREQUENCY: 2x/week  PT DURATION: 6-8 weeks  PLANNED INTERVENTIONS: 97164- PT Re-evaluation, 97750- Physical Performance Testing, 97110-Therapeutic exercises, 97530- Therapeutic activity, 97112- Neuromuscular re-education, 97535- Self Care, [id number]- Manual therapy, [phone]- Gait training, [id number]- Aquatic Therapy, [phone]- Electrical stimulation (unattended), [id number]- Ultrasound, [id number]- Traction (mechanical), [id number]- Ionotophoresis 4mg /ml Dexamethasone , [id number] (1-2 muscles), [id number] (3+ muscles)- Dry Needling, Patient/Family education, Balance training, Stair training, Taping, Joint mobilization, Spinal mobilization, Cryotherapy, and Moist heat.  PLAN FOR NEXT SESSION: provide fall prevention education; gentle stretching and  strengthening for lower back and LE; incorporate balance activities; review/update HEP PRN; Possible manual therapy STM to lumbar paraspinals, SIJ, L adductors; Postural education?    Shanee Batch L Glorie Dowlen, PTA 10/23/2023, 9:34 AM

## 2023-10-25 ENCOUNTER — Ambulatory Visit

## 2023-10-25 DIAGNOSIS — R252 Cramp and spasm: Secondary | ICD-10-CM

## 2023-10-25 DIAGNOSIS — H2513 Age-related nuclear cataract, bilateral: Secondary | ICD-10-CM | POA: Diagnosis not present

## 2023-10-25 DIAGNOSIS — H5203 Hypermetropia, bilateral: Secondary | ICD-10-CM | POA: Diagnosis not present

## 2023-10-25 DIAGNOSIS — M5459 Other low back pain: Secondary | ICD-10-CM | POA: Diagnosis not present

## 2023-10-25 DIAGNOSIS — H52203 Unspecified astigmatism, bilateral: Secondary | ICD-10-CM | POA: Diagnosis not present

## 2023-10-25 DIAGNOSIS — H04123 Dry eye syndrome of bilateral lacrimal glands: Secondary | ICD-10-CM | POA: Diagnosis not present

## 2023-10-25 DIAGNOSIS — R293 Abnormal posture: Secondary | ICD-10-CM

## 2023-10-25 DIAGNOSIS — R262 Difficulty in walking, not elsewhere classified: Secondary | ICD-10-CM

## 2023-10-25 DIAGNOSIS — H02834 Dermatochalasis of left upper eyelid: Secondary | ICD-10-CM | POA: Diagnosis not present

## 2023-10-25 DIAGNOSIS — H43393 Other vitreous opacities, bilateral: Secondary | ICD-10-CM | POA: Diagnosis not present

## 2023-10-25 DIAGNOSIS — H524 Presbyopia: Secondary | ICD-10-CM | POA: Diagnosis not present

## 2023-10-25 DIAGNOSIS — H02831 Dermatochalasis of right upper eyelid: Secondary | ICD-10-CM | POA: Diagnosis not present

## 2023-10-25 DIAGNOSIS — S39012A Strain of muscle, fascia and tendon of lower back, initial encounter: Secondary | ICD-10-CM | POA: Diagnosis not present

## 2023-10-25 DIAGNOSIS — E119 Type 2 diabetes mellitus without complications: Secondary | ICD-10-CM | POA: Diagnosis not present

## 2023-10-25 NOTE — Therapy (Signed)
 OUTPATIENT PHYSICAL THERAPY THORACOLUMBAR TREATMENT    Patient Name: Demarrius Guerrero MRN: [id number] DOB:[birth year], 68 y.o., male Today's Date: 10/25/2023  END OF SESSION:  PT End of Session - 10/25/23 0931     Visit Number 4    Date for PT Re-Evaluation 12/10/23    Authorization Type Medicare and [employer]    Progress Note Due on Visit 10    PT Start Time 0930    PT Stop Time 1015    PT Time Calculation (min) 45 min    Activity Tolerance Patient tolerated treatment well    Behavior During Therapy [facility] for tasks assessed/performed           Past Medical History:  Diagnosis Date   Allergy    Arthritis    ASTHMA 09/07/2009   Diabetes mellitus without complication (HCC)    Herniated disc    HYPERLIPIDEMIA 02/07/2007   HYPERTENSION 11/29/2005   OBESITY 10/01/2007   Sleep apnea    cpap   SLEEP APNEA, OBSTRUCTIVE 10/01/2007   Past Surgical History:  Procedure Laterality Date   bicep surgery Right 2014   COLONOSCOPY     ELBOW SURGERY Right 2013   POLYPECTOMY     removal disc neck  2015   SHOULDER SURGERY Right 1990   TOTAL HIP ARTHROPLASTY 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   Procedure: RIGHT TOTAL HIP ARTHROPLASTY ANTERIOR APPROACH;  Surgeon: Vernetta Lonni GRADE, MD;  Location: [facility];  Service: Orthopedics;  Laterality: Right;   Patient Active Problem List   Diagnosis Date Noted   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   Prediabetes 02/18/2015   Left leg pain 09/01/2013   Left knee pain 09/01/2013   Left hip pain 08/04/2013   Allergic rhinitis 04/09/2013   Other abnormal glucose 10/24/2012   Dermatitis 05/07/2012   Cervical radiculopathy 02/22/2012   HERNIATED DISC 05/11/2010   Asthma 09/07/2009   Obesity 10/01/2007   Obstructive sleep apnea 10/01/2007   Dyslipidemia 02/07/2007   Essential hypertension 11/29/2005    PCP: Merna Huxley, NP   REFERRING PROVIDER: Merna Huxley, NP   REFERRING DIAG: [phone] (ICD-10-CM) - Strain of muscle,  fascia and tendon of lower back, initial encounter   RATIONALE FOR EVALUATION AND TREATMENT: Rehabilitation  THERAPY DIAG:  Other low back pain  Cramp and spasm  Difficulty in walking, not elsewhere classified  Abnormal posture  ONSET DATE: ~6 wks   NEXT MD VISIT: None currently scheduled     SUBJECTIVE:  SUBJECTIVE STATEMENT:  Pt reports he ate something last night, that upset his stomach still a little upset right now.  PERTINENT HISTORY:  PMH: arthritis, asthma, DM, hyperlipidemia, HTN, R total hip replacement, R biceps repair, obesity, OSA  PAIN:  Are you having pain? Yes: NPRS scale: 0/10, 8/10 (worst) Pain location: L side on his belt line  Pain description: like my back is in a vice, constant Aggravating factors: standing, walking, any moving  Relieving factors: lying flat/supine, holding grandchild   PRECAUTIONS: None  RED FLAGS: None   WEIGHT BEARING RESTRICTIONS: No  FALLS:  Has patient fallen in last 6 months? No  LIVING ENVIRONMENT: Lives with: lives with their spouse Lives in: House/apartment Stairs: No Has following equipment at home: Single point cane and Grab bars  OCCUPATION: Retired, Disabled   PLOF: Independent with basic ADLs and Leisure: watching tv   PATIENT GOALS: Get the pain to subside or go away    OBJECTIVE:  Note: Objective measures were completed at Evaluation unless otherwise noted.  DIAGNOSTIC FINDINGS:  07/09/23- XR Hip UNILATERAL W/ OR W/O PELVIS 2-3 VIEWS RIGHT  Impression: AP pelvis lateral view right hip: Bilateral hips well located.  Right total hip arthroplasty components well-seated no complicating features.  No acute fractures or acute findings   PATIENT SURVEYS:  Modified Oswestry Low Back Pain Disability Questionnaire: 29 / 50  = 58.0 % (Severe Disability)  COGNITION: Overall cognitive status: Within functional limits for tasks assessed     SENSATION: Noted some numbness in the L thigh most of the time   MUSCLE LENGTH: Hamstrings: Right mod tight; Left mod tight IR: L-severe tight; p! R-mod tight ER: B Mod tight(p! On L side)   POSTURE: rounded shoulders, forward head, flexed trunk , and weight shift right  PALPATION: TTP: Medial adductors, L SIJ Joint (palpable knot/plica-pt reports this is where he feels his pain)   LUMBAR ROM:   AROM eval  Flexion 50% limited  Extension 25% limited  Right lateral flexion Hand on proximal  thigh  Left lateral flexion Hand on distal thigh  Right rotation WFL; p!  Left rotation WFL: p!   (Blank rows = not tested)  LOWER EXTREMITY ROM:    Grossly WFL; overall stiffened, slower movement   LOWER EXTREMITY MMT:    MMT Right eval Left eval  Hip flexion 4- 4-; p!  Hip extension 4- 4-; p!  Hip abduction 4-; p 4-; p  Hip adduction 4- 4-  Hip internal rotation 3+ p! 4- p!  Hip external rotation 4- 4-; p!  Knee flexion 4+ 4+  Knee extension 5 5+  Ankle dorsiflexion 5 5  Ankle plantarflexion    Ankle inversion    Ankle eversion     (Blank rows = not tested)  LUMBAR SPECIAL TESTS:  Not assessed   FUNCTIONAL TESTS:  5 times sit to stand: 37.0 sec  GAIT: Distance walked: clinic distance Assistive device utilized: None Level of assistance: Complete Independence Comments: Pt displays antalgic gait pattern w/ R weight shift throughout gait.   TREATMENT DATE:  10/25/23 THERAPEUTIC EXERCISE: To improve strength, endurance, ROM, and flexibility.  Nustep L5x6min Seated lumbar flexion green pball 2x1' each Seated QL stretch B side bend x Supine DKTC with peanut ball x 10 NEUROMUSCULAR RE-EDUCATION: To improve coordination, kinesthesia, posture, and proprioception Seated on dynadisk:  Rows GTB 2x10   Shoulder ext GTB x 15  Pallof press 2x10 GTB TrA +  Bridge 2x10 Supine bent knee fallout [employer]  TB 10x3; 2 sets B sides                                                                                                                              10/23/23 Seated lumbar flexion green pball 2x1' each Supine LTRs x10 B, 3-5  Hooklying TrA + Bridges x15 w/ breathing maintained  Supine Marches x20 B w/ cues to maintain breathing  Supine bent knee fallout Blue TB 10x3 B Standing overhead lat stretch at ladder x 1 min each side Standing trunk rotations holding onto ladder x 10 both sides  10/17/23 PHYSICAL PERFORMANCE TEST or MEASUREMENT: Functional Gait  Assessment  Gait assessed  Yes   Gait Level Surface Walks 20 ft, slow speed, abnormal gait pattern, evidence for imbalance or deviates 10-15 in outside of the 12 in walkway width. Requires more than 7 sec to ambulate 20 ft.   Change in Gait Speed Makes only minor adjustments to walking speed, or accomplishes a change in speed with significant gait deviations, deviates 10-15 in outside the 12 in walkway width, or changes speed but loses balance but is able to recover and continue walking.   Gait with Horizontal Head Turns Performs head turns smoothly with slight change in gait velocity (eg, minor disruption to smooth gait path), deviates 6-10 in outside 12 in walkway width, or uses an assistive device.   Gait with Vertical Head Turns Performs task with moderate change in gait velocity, slows down, deviates 10-15 in outside 12 in walkway width but recovers, can continue to walk.   Gait and Pivot Turn Pivot turns safely in greater than 3 sec and stops with no loss of balance, or pivot turns safely within 3 sec and stops with mild imbalance, requires small steps to catch balance.   Step Over Obstacle Is able to step over one shoe box (4.5 in total height) but must slow down and adjust steps to clear box safely. May require verbal cueing.   Gait with Narrow Base of Support Ambulates less than 4 steps heel to  toe or cannot perform without assistance.   Gait with Eyes Closed Walks 20 ft, uses assistive device, slower speed, mild gait deviations, deviates 6-10 in outside 12 in walkway width. Ambulates 20 ft in less than 9 sec but greater than 7 sec.   Ambulating Backwards Walks 20 ft, slow speed, abnormal gait pattern, evidence for imbalance, deviates 10-15 in outside 12 in walkway width.   Steps Alternating feet, must use rail.   Total Score 13      SELF CARE/THER-EX Reviewed pt's initial HEP:  Supine LTRs x10 B, 3-5  Supine SBKTC 2x30 B  Supine HS stretch with strap 2x30  Hooklying Diaphragmatic Breathing x10  Hooklying TrA+ Glute Sets x10 w/ breathing maintained  Hooklying TrA + Bridges x10 w/ breathing maintained  Supine Marches x10 B w/ cues to maintain breathing    10/15/23 SELF CARE:  Reviewed eval findings and role  of PT in addressing identified deficits as well as instruction in initial HEP (see below).    PATIENT EDUCATION:  Education details: HEP update  Person educated: Patient Education method: Explanation, Demonstration, Verbal cues, and Tactile cues Education comprehension: verbalized understanding, returned demonstration, verbal cues required, tactile cues required, and needs further education   HOME EXERCISE PROGRAM: Access Code: [email or web address] URL: [email or web address] Date: 10/25/2023 Prepared by: Ekta Dancer  Exercises - Supine Lower Trunk Rotation  - 1 x daily - 7 x weekly - 1 sets - 5 reps - 30s hold - Supine Single Knee to Chest Stretch  - 1 x daily - 7 x weekly - 2 sets - 10 reps - 10s hold - Supine Hamstring Stretch with Strap  - 1 x daily - 7 x weekly - 3 reps - 30s hold - Hooklying Clamshell with Resistance  - 1 x daily - 7 x weekly - 3 sets - 10 reps - 3-5 sec hold - Supine Bridge  - 1 x daily - 7 x weekly - 3 sets - 10 reps - 3-5 sec hold hold - Seated Quadratus Lumborum Stretch in Chair  - 1 x daily - 7 x weekly - 3 sets - 3 reps - 30  seconds- 1 min hold   ASSESSMENT:  CLINICAL IMPRESSION: Pt responded well to treatment. Advanced stretching and stabilization for lumbar spine. Progressed more core stabilization adding dynadisk for more facilitation of the core.    EVAL: Andrell Cupcake Kopka is a 68 y/o M referred to PT for evaluation and treatment of low back pain. Tracey states he has L sided low back pain that has been worsening over the last 6 weeks or so. He is having difficulty with any moving especially standing. Pt ambulates independently with a significant lateral shift to the R side which remains in sitting as well. Pt says his L lower back is where his pain is so, his positioning is consistent with his pain. Today's assessment revealed BLE weakness and pain, dec'd flexibility, and TTP at the L SIJ with palpable knot or possible plica in the area, states this is his area of pain with periodic numbness to the anterior thigh. Pt is also TTP at the medial knee/pes anserine insertion. Noted inc'd difficulty with bed mobility and transfers as well today. Pt completed 5xSTS in 37.0 sec indicating fall risk at this time, noted that with standing pt has pain so, this slowed down his movement during. Ewan scored a 29 / 50 on the Modified Oswestry for low back pain, representing severe disability due to his pain. Dimas will benefit from skilled PT intervention to address deficits found today to improve mobility, function, and activity tolerance with dec'd pain interference and dec'd risk of falls.   OBJECTIVE IMPAIRMENTS: Abnormal gait, decreased activity tolerance, decreased balance, decreased endurance, decreased knowledge of condition, decreased mobility, difficulty walking, decreased ROM, decreased strength, hypomobility, increased fascial restrictions, impaired perceived functional ability, increased muscle spasms, impaired flexibility, improper body mechanics, postural dysfunction, obesity, and pain.   ACTIVITY LIMITATIONS:  carrying, lifting, bending, sitting, standing, squatting, stairs, transfers, bed mobility, locomotion level, and caring for others  PARTICIPATION LIMITATIONS: driving, shopping, and community activity  PERSONAL FACTORS: Age, Past/current experiences, Time since onset of injury/illness/exacerbation, and 3+ comorbidities: PMH: arthritis, asthma, DM, hyperlipidemia, HTN, R total hip replacement, R biceps repair, obesity, OSA are also affecting patient's functional outcome.   REHAB POTENTIAL: Good  CLINICAL DECISION MAKING: Evolving/moderate complexity  EVALUATION COMPLEXITY: Moderate   GOALS: Goals reviewed  with patient? Yes  SHORT TERM GOALS: Target date: 11/05/2023   Patient will be independent with initial HEP.  Baseline: Goal status: MET- 10/25/23  2.  Patient will report at least 15-20% improvement in low back pain for improved QoL.  Baseline: 6/10, 8/10 (worst) Goal status: INITIAL   3.  Patient will be able to tolerate at least 15 minutes of standing without pain interference.  Baseline: per modified Oswestry - I avoid standing because it increases my pain right away.  Goal status: INITIAL   LONG TERM GOALS: Target date: 12/10/2023   Patient will be independent with advanced HEP.  Baseline:  Goal status: INITIAL  2.  Patient will report at least 50% improvement in low back pain for improve QoL.  Baseline: 6/10, 8/10 (worst) Goal status: INITIAL  3.  Patient will improve his Modified Oswestry score by at least 10 points to decrease level of disability d/t pain.  Baseline: 29/50 Goal status: INITIAL  4.  Patient will perform 5xSTS in under 20s to decrease fall risk and improve LE strength.  Baseline: 37s at eval Goal status: INITIAL  5.  Patient will increase BLE strength grossly to 4+/5 to improve endurance and activity tolerance.  Baseline: MMT chart above Goal status: INITIAL  6. 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  Baseline: 13/30- see above Goal status: INITIAL   PLAN:  PT FREQUENCY: 2x/week  PT DURATION: 6-8 weeks  PLANNED INTERVENTIONS: 97164- PT Re-evaluation, 97750- Physical Performance Testing, 97110-Therapeutic exercises, 97530- Therapeutic activity, 97112- Neuromuscular re-education, 97535- Self Care, [id number]- Manual therapy, [phone]- Gait training, [phone]- Aquatic Therapy, [phone]- Electrical stimulation (unattended), [id number]- Ultrasound, [id number]- Traction (mechanical), [id number]- Ionotophoresis 4mg /ml Dexamethasone , [id number] (1-2 muscles), [id number] (3+ muscles)- Dry Needling, Patient/Family education, Balance training, Stair training, Taping, Joint mobilization, Spinal mobilization, Cryotherapy, and Moist heat.  PLAN FOR NEXT SESSION: gentle stretching and strengthening for lower back and LE; incorporate balance activities; Possible manual therapy STM to lumbar paraspinals, SIJ, L adductors; Postural education?    Alithea Lapage L Medardo Hassing, PTA 10/25/2023, 10:18 AM

## 2023-10-31 ENCOUNTER — Ambulatory Visit: Attending: Adult Health | Admitting: Physical Therapy

## 2023-10-31 ENCOUNTER — Encounter: Payer: Self-pay | Admitting: Physical Therapy

## 2023-10-31 DIAGNOSIS — M5459 Other low back pain: Secondary | ICD-10-CM | POA: Diagnosis not present

## 2023-10-31 DIAGNOSIS — R262 Difficulty in walking, not elsewhere classified: Secondary | ICD-10-CM | POA: Diagnosis not present

## 2023-10-31 DIAGNOSIS — R293 Abnormal posture: Secondary | ICD-10-CM | POA: Insufficient documentation

## 2023-10-31 DIAGNOSIS — R252 Cramp and spasm: Secondary | ICD-10-CM | POA: Diagnosis not present

## 2023-10-31 NOTE — Therapy (Addendum)
 OUTPATIENT PHYSICAL THERAPY TREATMENT    Patient Name: Tommy Jackson MRN: [id number] DOB:03/24/1955, 68 y.o., male Today's Date: 10/31/2023  END OF SESSION:  PT End of Session - 10/31/23 1057     Visit Number 5    Date for PT Re-Evaluation 12/10/23    Authorization Type Medicare and [employer]    Progress Note Due on Visit 10    PT Start Time 1057    PT Stop Time 1153    PT Time Calculation (min) 56 min    Activity Tolerance Patient tolerated treatment well    Behavior During Therapy [facility] for tasks assessed/performed            Past Medical History:  Diagnosis Date   Allergy    Arthritis    ASTHMA 09/07/2009   Diabetes mellitus without complication (HCC)    Herniated disc    HYPERLIPIDEMIA 02/07/2007   HYPERTENSION 11/29/2005   OBESITY 10/01/2007   Sleep apnea    cpap   SLEEP APNEA, OBSTRUCTIVE 10/01/2007   Past Surgical History:  Procedure Laterality Date   bicep surgery Right 2014   COLONOSCOPY     ELBOW SURGERY Right 2013   POLYPECTOMY     removal disc neck  2015   SHOULDER SURGERY Right 1990   TOTAL HIP ARTHROPLASTY 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   Procedure: RIGHT TOTAL HIP ARTHROPLASTY ANTERIOR APPROACH;  Surgeon: Vernetta Lonni GRADE, MD;  Location: [facility];  Service: Orthopedics;  Laterality: Right;   Patient Active Problem List   Diagnosis Date Noted   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   Prediabetes 02/18/2015   Left leg pain 09/01/2013   Left knee pain 09/01/2013   Left hip pain 08/04/2013   Allergic rhinitis 04/09/2013   Other abnormal glucose 10/24/2012   Dermatitis 05/07/2012   Cervical radiculopathy 02/22/2012   HERNIATED DISC 05/11/2010   Asthma 09/07/2009   Obesity 10/01/2007   Obstructive sleep apnea 10/01/2007   Dyslipidemia 02/07/2007   Essential hypertension 11/29/2005    PCP: Merna Huxley, NP   REFERRING PROVIDER: Merna Huxley, NP   REFERRING DIAG: [phone] (ICD-10-CM) - Strain of muscle, fascia and tendon  of lower back, initial encounter   RATIONALE FOR EVALUATION AND TREATMENT: Rehabilitation  THERAPY DIAG:  Other low back pain  Cramp and spasm  Difficulty in walking, not elsewhere classified  Abnormal posture  ONSET DATE: ~6 wks   NEXT MD VISIT: None currently scheduled     SUBJECTIVE:  SUBJECTIVE STATEMENT: Pt reports all in all (his back is doing) pretty good. No pain currently and typically only up to 4-5/10 recently.  PERTINENT HISTORY:  PMH: arthritis, asthma, DM, hyperlipidemia, HTN, R total hip replacement, R biceps repair, obesity, OSA  PAIN:  Are you having pain? Yes: NPRS scale: 0/10, 4-5/10 (worst) Pain location: L side on his belt line  Pain description: like my back is in a vice, constant Aggravating factors: standing, walking, any moving  Relieving factors: lying flat/supine, holding grandchild   PRECAUTIONS: None  RED FLAGS: None   WEIGHT BEARING RESTRICTIONS: No  FALLS:  Has patient fallen in last 6 months? No  LIVING ENVIRONMENT: Lives with: lives with their spouse Lives in: House/apartment Stairs: No Has following equipment at home: Single point cane and Grab bars  OCCUPATION: Retired, Disabled   PLOF: Independent with basic ADLs and Leisure: watching tv   PATIENT GOALS: Get the pain to subside or go away    OBJECTIVE:  Note: Objective measures were completed at Evaluation unless otherwise noted.  DIAGNOSTIC FINDINGS:  07/09/23- XR Hip UNILATERAL W/ OR W/O PELVIS 2-3 VIEWS RIGHT  Impression: AP pelvis lateral view right hip: Bilateral hips well located.  Right total hip arthroplasty components well-seated no complicating features.  No acute fractures or acute findings   PATIENT SURVEYS:  Modified Oswestry Low Back Pain Disability Questionnaire: 29  / 50 = 58.0 % (Severe Disability)  COGNITION: Overall cognitive status: Within functional limits for tasks assessed     SENSATION: Noted some numbness in the L thigh most of the time   MUSCLE LENGTH: Hamstrings: Right mod tight; Left mod tight IR: L-severe tight; p! R-mod tight ER: B Mod tight(p! On L side)   POSTURE: rounded shoulders, forward head, flexed trunk , and weight shift right  PALPATION: TTP: Medial adductors, L SIJ Joint (palpable knot/plica-pt reports this is where he feels his pain)   LUMBAR ROM:   AROM eval  Flexion 50% limited  Extension 25% limited  Right lateral flexion Hand on proximal  thigh  Left lateral flexion Hand on distal thigh  Right rotation WFL; p!  Left rotation WFL: p!   (Blank rows = not tested)  LOWER EXTREMITY ROM:    Grossly WFL; overall stiffened, slower movement   LOWER EXTREMITY MMT:    MMT Right eval Left eval  Hip flexion 4- 4-; p!  Hip extension 4- 4-; p!  Hip abduction 4-; p 4-; p  Hip adduction 4- 4-  Hip internal rotation 3+ p! 4- p!  Hip external rotation 4- 4-; p!  Knee flexion 4+ 4+  Knee extension 5 5+  Ankle dorsiflexion 5 5  Ankle plantarflexion    Ankle inversion    Ankle eversion     (Blank rows = not tested)  LUMBAR SPECIAL TESTS:  Not assessed   FUNCTIONAL TESTS:  5 times sit to stand: 37.0 sec  GAIT: Distance walked: clinic distance Assistive device utilized: None Level of assistance: Complete Independence Comments: Pt displays antalgic gait pattern w/ R weight shift throughout gait.   TREATMENT DATE:   10/31/23 THERAPEUTIC EXERCISE: To improve strength, endurance, ROM, and flexibility.  Demonstration, verbal and tactile cues throughout for technique.  NuStep - L5 x 6 min Seated 3-way lumbar flexion stretch with hands on green pball 2 x 30-60 each Seated 3-way lumbar flexion stretch with hands supported on SPC 2 x 30-60 each Bridge + hip ABD isometric with looped GTB at distal thighs 5 x  5  Bridge + hip ABD isometric with mobilization belt at distal thighs 5 x 5  SELF CARE: Provided education to increase independence with ADLs and to facilitate performance of basic household cleaning/chores with decreased pain. Provided education in proper posture and body mechanics for typical daily positioning and household chores to minimize strain on low back.  Added 1/8 cork to R shoe to achieve better symmetry of B iliac crest height and therefore align spine. Advised patient to utilize for trial - should notice some improvement in overall activity tolerance with standing and walking but if increased or different pain LS region, glutes or LEs, remove the lift.   10/25/23 THERAPEUTIC EXERCISE: To improve strength, endurance, ROM, and flexibility.  Nustep L5x6min Seated lumbar flexion green pball 2x1' each Seated QL stretch B side bend x Supine DKTC with peanut ball x 10  NEUROMUSCULAR RE-EDUCATION: To improve coordination, kinesthesia, posture, and proprioception Seated on dynadisk:  Rows GTB 2x10   Shoulder ext GTB x 15  Pallof press 2x10 GTB TrA + Bridge 2x10 Supine bent knee fallout Blue TB 10x3; 2 sets B sides                                                                                                                                10/23/23 Seated lumbar flexion green pball 2x1' each Supine LTRs x10 B, 3-5  Hooklying TrA + Bridges x15 w/ breathing maintained  Supine Marches x20 B w/ cues to maintain breathing  Supine bent knee fallout Blue TB 10x3 B Standing overhead lat stretch at ladder x 1 min each side Standing trunk rotations holding onto ladder x 10 both sides   10/17/23 PHYSICAL PERFORMANCE TEST or MEASUREMENT: Functional Gait  Assessment  Gait assessed  Yes   Gait Level Surface Walks 20 ft, slow speed, abnormal gait pattern, evidence for imbalance or deviates 10-15 in outside of the 12 in walkway width. Requires more than 7 sec to ambulate 20 ft.    Change in Gait Speed Makes only minor adjustments to walking speed, or accomplishes a change in speed with significant gait deviations, deviates 10-15 in outside the 12 in walkway width, or changes speed but loses balance but is able to recover and continue walking.   Gait with Horizontal Head Turns Performs head turns smoothly with slight change in gait velocity (eg, minor disruption to smooth gait path), deviates 6-10 in outside 12 in walkway width, or uses an assistive device.   Gait with Vertical Head Turns Performs task with moderate change in gait velocity, slows down, deviates 10-15 in outside 12 in walkway width but recovers, can continue to walk.   Gait and Pivot Turn Pivot turns safely in greater than 3 sec and stops with no loss of balance, or pivot turns safely within 3 sec and stops with mild imbalance, requires small steps to catch balance.   Step Over Obstacle Is able to step over one shoe box (4.5  in total height) but must slow down and adjust steps to clear box safely. May require verbal cueing.   Gait with Narrow Base of Support Ambulates less than 4 steps heel to toe or cannot perform without assistance.   Gait with Eyes Closed Walks 20 ft, uses assistive device, slower speed, mild gait deviations, deviates 6-10 in outside 12 in walkway width. Ambulates 20 ft in less than 9 sec but greater than 7 sec.   Ambulating Backwards Walks 20 ft, slow speed, abnormal gait pattern, evidence for imbalance, deviates 10-15 in outside 12 in walkway width.   Steps Alternating feet, must use rail.   Total Score 13      SELF CARE/THER-EX Reviewed pt's initial HEP:  Supine LTRs x10 B, 3-5  Supine SBKTC 2x30 B  Supine HS stretch with strap 2x30  Hooklying Diaphragmatic Breathing x10  Hooklying TrA+ Glute Sets x10 w/ breathing maintained  Hooklying TrA + Bridges x10 w/ breathing maintained  Supine Marches x10 B w/ cues to maintain breathing    10/15/23 SELF CARE:  Reviewed eval findings and  role of PT in addressing identified deficits as well as instruction in initial HEP (see below).    PATIENT EDUCATION:  Education details: HEP update  Person educated: Patient Education method: Explanation, Demonstration, Verbal cues, and Tactile cues Education comprehension: verbalized understanding, returned demonstration, verbal cues required, tactile cues required, and needs further education   HOME EXERCISE PROGRAM: Access Code: [email or web address] URL: [email or web address] Date: 10/31/2023 Prepared by: Elijah Hidden  Exercises - Supine Lower Trunk Rotation  - 1 x daily - 7 x weekly - 1 sets - 5 reps - 30s hold - Supine Hamstring Stretch with Strap  - 1 x daily - 7 x weekly - 3 reps - 30s hold - Hooklying Clamshell with Resistance  - 1 x daily - 7 x weekly - 3 sets - 10 reps - 3-5 sec hold - Seated Quadratus Lumborum Stretch in Chair  - 1 x daily - 7 x weekly - 3 sets - 3 reps - 30 seconds- 1 min hold - Seated 3 Way Exercise Ball Roll Out Stretch  - 1 x daily - 7 x weekly - 2 sets - 3 reps - 30 sec hold - Supine Bridge with Resistance Band  - 1 x daily - 7 x weekly - 2 sets - 10 reps - 5 sec hold  Patient Education - Posture and Body Mechanics   ASSESSMENT:  CLINICAL IMPRESSION: Tommy Jackson reports 50-60% improvement in his pain thus far with PT.  He still reports limited standing tolerance with increased LBP starting typically after ~5 minutes of standing.   Education provided for proper posture and body mechanics for typical daily positioning and household chores to minimize strain on low back and reduce pain with daily activities.  Tommy Jackson reports difficulty with SKTC stretch due to abdominal girth, therefore provided instruction in seated lumbar flexion stretch using cane for upper extremity support with patient reporting better tolerance.  He also reports increased pain with attempts to perform bridges at home during HEP, therefore reviewed technique adding hip abduction  isometric with either GTB or nonflexible belt with patient reporting improvement in pain.  Tommy Jackson will benefit from continued skilled PT to address ongoing flexibility/range of motion, abnormal muscle tension, strength and balance deficits to improve mobility and activity tolerance with decreased pain interference and decreased risk for falls.    EVAL: Tommy Jackson is a 68 y/o M referred to PT for evaluation and  treatment of low back pain. Tommy Jackson states he has L sided low back pain that has been worsening over the last 6 weeks or so. He is having difficulty with any moving especially standing. Pt ambulates independently with a significant lateral shift to the R side which remains in sitting as well. Pt says his L lower back is where his pain is so, his positioning is consistent with his pain. Today's assessment revealed BLE weakness and pain, dec'd flexibility, and TTP at the L SIJ with palpable knot or possible plica in the area, states this is his area of pain with periodic numbness to the anterior thigh. Pt is also TTP at the medial knee/pes anserine insertion. Noted inc'd difficulty with bed mobility and transfers as well today. Pt completed 5xSTS in 37.0 sec indicating fall risk at this time, noted that with standing pt has pain so, this slowed down his movement during. Tommy Jackson scored a 29 / 50 on the Modified Oswestry for low back pain, representing severe disability due to his pain. Tommy Jackson will benefit from skilled PT intervention to address deficits found today to improve mobility, function, and activity tolerance with dec'd pain interference and dec'd risk of falls.   OBJECTIVE IMPAIRMENTS: Abnormal gait, decreased activity tolerance, decreased balance, decreased endurance, decreased knowledge of condition, decreased mobility, difficulty walking, decreased ROM, decreased strength, hypomobility, increased fascial restrictions, impaired perceived functional ability, increased muscle spasms, impaired  flexibility, improper body mechanics, postural dysfunction, obesity, and pain.   ACTIVITY LIMITATIONS: carrying, lifting, bending, sitting, standing, squatting, stairs, transfers, bed mobility, locomotion level, and caring for others  PARTICIPATION LIMITATIONS: driving, shopping, and community activity  PERSONAL FACTORS: Age, Past/current experiences, Time since onset of injury/illness/exacerbation, and 3+ comorbidities: PMH: arthritis, asthma, DM, hyperlipidemia, HTN, R total hip replacement, R biceps repair, obesity, OSA are also affecting patient's functional outcome.   REHAB POTENTIAL: Good  CLINICAL DECISION MAKING: Evolving/moderate complexity  EVALUATION COMPLEXITY: Moderate   GOALS: Goals reviewed with patient? Yes  SHORT TERM GOALS: Target date: 11/05/2023   Patient will be independent with initial HEP.  Baseline: Goal status: MET - 10/25/23  2.  Patient will report at least 15-20% improvement in low back pain for improved QoL.  Baseline: 6/10, 8/10 (worst) Goal status: MET - 10/31/23 - Pt reports at least 50-60% improvement in his pain  3.  Patient will be able to tolerate at least 15 minutes of standing without pain interference.  Baseline: per modified Oswestry - I avoid standing because it increases my pain right away.  Goal status: IN PROGRESS - 10/31/23 - Pt reports he is able to stand ~5 min before his pain starts to increase  LONG TERM GOALS: Target date: 12/10/2023   Patient will be independent with advanced HEP.  Baseline:  Goal status: IN PROGRESS - 10/31/23 - HEP reviewed and modified for better tolerance  2.  Patient will report at least 50% improvement in low back pain for improve QoL.  Baseline: 6/10, 8/10 (worst) Goal status: MET - 10/31/2023 - Pt reports at least 50-60% improvement in his pain  3.  Patient will improve his Modified Oswestry score by at least 10 points to decrease level of disability d/t pain.  Baseline: 29/50 Goal status: INITIAL  4.   Patient will perform 5xSTS in under 20s to decrease fall risk and improve LE strength.  Baseline: 37s at eval Goal status: INITIAL  5.  Patient will increase BLE strength grossly to 4+/5 to improve endurance and activity tolerance.  Baseline: MMT  chart above Goal status: INITIAL  6. 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  Baseline: 13/30- see above Goal status: INITIAL   PLAN:  PT FREQUENCY: 2x/week  PT DURATION: 6-8 weeks  PLANNED INTERVENTIONS: [id number]- PT Re-evaluation, 97750- Physical Performance Testing, 97110-Therapeutic exercises, 97530- Therapeutic activity, 97112- Neuromuscular re-education, 97535- Self Care, [id number]- Manual therapy, [phone]- Gait training, [phone]- Aquatic Therapy, [phone]- Electrical stimulation (unattended), [id number]- Ultrasound, [id number]- Traction (mechanical), [id number]- Ionotophoresis 4mg /ml Dexamethasone , 79439 (1-2 muscles), 20561 (3+ muscles)- Dry Needling, Patient/Family education, Balance training, Stair training, Taping, Joint mobilization, Spinal mobilization, Cryotherapy, and Moist heat.  PLAN FOR NEXT SESSION: Assess response to cork shoe insert and replace with rubber insert if benefit noted; gentle stretching and strengthening for lower back and LE; incorporate balance activities; Possible manual therapy STM to lumbar paraspinals, SIJ, L adductors; review postural and body mechanics education PRN    Elijah CHRISTELLA Hidden, PT 10/31/2023, 1:13 PM

## 2023-11-06 ENCOUNTER — Ambulatory Visit

## 2023-11-06 DIAGNOSIS — R293 Abnormal posture: Secondary | ICD-10-CM

## 2023-11-06 DIAGNOSIS — M5459 Other low back pain: Secondary | ICD-10-CM | POA: Diagnosis not present

## 2023-11-06 DIAGNOSIS — R262 Difficulty in walking, not elsewhere classified: Secondary | ICD-10-CM | POA: Diagnosis not present

## 2023-11-06 DIAGNOSIS — R252 Cramp and spasm: Secondary | ICD-10-CM

## 2023-11-06 NOTE — Therapy (Signed)
 OUTPATIENT PHYSICAL THERAPY TREATMENT    Patient Name: Tommy Jackson MRN: [id number] DOB:December 30, 1955, 68 y.o., male Today's Date: 11/06/2023  END OF SESSION:  PT End of Session - 11/06/23 1058     Visit Number 6    Date for PT Re-Evaluation 12/10/23    Authorization Type Medicare and [employer]    Progress Note Due on Visit 10    PT Start Time 1035   pt late   PT Stop Time 1100    PT Time Calculation (min) 25 min    Activity Tolerance Patient tolerated treatment well    Behavior During Therapy [facility] for tasks assessed/performed             Past Medical History:  Diagnosis Date   Allergy    Arthritis    ASTHMA 09/07/2009   Diabetes mellitus without complication (HCC)    Herniated disc    HYPERLIPIDEMIA 02/07/2007   HYPERTENSION 11/29/2005   OBESITY 10/01/2007   Sleep apnea    cpap   SLEEP APNEA, OBSTRUCTIVE 10/01/2007   Past Surgical History:  Procedure Laterality Date   bicep surgery Right 2014   COLONOSCOPY     ELBOW SURGERY Right 2013   POLYPECTOMY     removal disc neck  2015   SHOULDER SURGERY Right 1990   TOTAL HIP ARTHROPLASTY 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   Procedure: RIGHT TOTAL HIP ARTHROPLASTY ANTERIOR APPROACH;  Surgeon: Vernetta Lonni GRADE, MD;  Location: [facility];  Service: Orthopedics;  Laterality: Right;   Patient Active Problem List   Diagnosis Date Noted   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   Prediabetes 02/18/2015   Left leg pain 09/01/2013   Left knee pain 09/01/2013   Left hip pain 08/04/2013   Allergic rhinitis 04/09/2013   Other abnormal glucose 10/24/2012   Dermatitis 05/07/2012   Cervical radiculopathy 02/22/2012   HERNIATED DISC 05/11/2010   Asthma 09/07/2009   Obesity 10/01/2007   Obstructive sleep apnea 10/01/2007   Dyslipidemia 02/07/2007   Essential hypertension 11/29/2005    PCP: Merna Huxley, NP   REFERRING PROVIDER: Merna Huxley, NP   REFERRING DIAG: [phone] (ICD-10-CM) - Strain of muscle, fascia  and tendon of lower back, initial encounter   RATIONALE FOR EVALUATION AND TREATMENT: Rehabilitation  THERAPY DIAG:  Other low back pain  Cramp and spasm  Difficulty in walking, not elsewhere classified  Abnormal posture  ONSET DATE: ~6 wks   NEXT MD VISIT: None currently scheduled     SUBJECTIVE:  SUBJECTIVE STATEMENT: Pt reports been doing good lately, good a lot of personal things going on and would like to take a break from therapy.  PERTINENT HISTORY:  PMH: arthritis, asthma, DM, hyperlipidemia, HTN, R total hip replacement, R biceps repair, obesity, OSA  PAIN:  Are you having pain? Yes: NPRS scale: 2/10, 4-5/10 (worst) Pain location: L side on his belt line  Pain description: like my back is in a vice, constant Aggravating factors: standing, walking, any moving  Relieving factors: lying flat/supine, holding grandchild   PRECAUTIONS: None  RED FLAGS: None   WEIGHT BEARING RESTRICTIONS: No  FALLS:  Has patient fallen in last 6 months? No  LIVING ENVIRONMENT: Lives with: lives with their spouse Lives in: House/apartment Stairs: No Has following equipment at home: Single point cane and Grab bars  OCCUPATION: Retired, Disabled   PLOF: Independent with basic ADLs and Leisure: watching tv   PATIENT GOALS: Get the pain to subside or go away    OBJECTIVE:  Note: Objective measures were completed at Evaluation unless otherwise noted.  DIAGNOSTIC FINDINGS:  07/09/23- XR Hip UNILATERAL W/ OR W/O PELVIS 2-3 VIEWS RIGHT  Impression: AP pelvis lateral view right hip: Bilateral hips well located.  Right total hip arthroplasty components well-seated no complicating features.  No acute fractures or acute findings   PATIENT SURVEYS:  Modified Oswestry Low Back Pain Disability  Questionnaire: 29 / 50 = 58.0 % (Severe Disability)  COGNITION: Overall cognitive status: Within functional limits for tasks assessed     SENSATION: Noted some numbness in the L thigh most of the time   MUSCLE LENGTH: Hamstrings: Right mod tight; Left mod tight IR: L-severe tight; p! R-mod tight ER: B Mod tight(p! On L side)   POSTURE: rounded shoulders, forward head, flexed trunk , and weight shift right  PALPATION: TTP: Medial adductors, L SIJ Joint (palpable knot/plica-pt reports this is where he feels his pain)   LUMBAR ROM:   AROM eval  Flexion 50% limited  Extension 25% limited  Right lateral flexion Hand on proximal  thigh  Left lateral flexion Hand on distal thigh  Right rotation WFL; p!  Left rotation WFL: p!   (Blank rows = not tested)  LOWER EXTREMITY ROM:    Grossly WFL; overall stiffened, slower movement   LOWER EXTREMITY MMT:    MMT Right eval Left eval R 11/06/23 L 11/06/23  Hip flexion 4- 4-; p! 4+ 4  Hip extension 4- 4-; p! 4+ 4+  Hip abduction 4-; p 4-; p 4+ 4+  Hip adduction 4- 4- 4 4  Hip internal rotation 3+ p! 4- p! 4+ 4+  Hip external rotation 4- 4-; p! 4+ 4+  Knee flexion 4+ 4+    Knee extension 5 5+    Ankle dorsiflexion 5 5    Ankle plantarflexion      Ankle inversion      Ankle eversion       (Blank rows = not tested)  LUMBAR SPECIAL TESTS:  Not assessed   FUNCTIONAL TESTS:  5 times sit to stand: 37.0 sec  GAIT: Distance walked: clinic distance Assistive device utilized: None Level of assistance: Complete Independence Comments: Pt displays antalgic gait pattern w/ R weight shift throughout gait.   TREATMENT DATE:  11/06/23 5XSTS- 15.04 sec w/ UE support LE MMT  [facility] PT Assessment - 11/06/23 0001       Functional Gait  Assessment   Gait Level Surface Walks 20 ft in less than 5.5 sec, no  assistive devices, good speed, no evidence for imbalance, normal gait pattern, deviates no more than 6 in outside of the 12 in walkway width.     Change in Gait Speed Able to smoothly change walking speed without loss of balance or gait deviation. Deviate no more than 6 in outside of the 12 in walkway width.    Gait with Horizontal Head Turns Performs head turns smoothly with slight change in gait velocity (eg, minor disruption to smooth gait path), deviates 6-10 in outside 12 in walkway width, or uses an assistive device.    Gait with Vertical Head Turns Performs task with slight change in gait velocity (eg, minor disruption to smooth gait path), deviates 6 - 10 in outside 12 in walkway width or uses assistive device    Gait and Pivot Turn Pivot turns safely within 3 sec and stops quickly with no loss of balance.    Step Over Obstacle Is able to step over 2 stacked shoe boxes taped together (9 in total height) without changing gait speed. No evidence of imbalance.    Gait with Narrow Base of Support Ambulates 4-7 steps.    Gait with Eyes Closed Walks 20 ft, uses assistive device, slower speed, mild gait deviations, deviates 6-10 in outside 12 in walkway width. Ambulates 20 ft in less than 9 sec but greater than 7 sec.    Ambulating Backwards Walks 20 ft, uses assistive device, slower speed, mild gait deviations, deviates 6-10 in outside 12 in walkway width.    Steps Alternating feet, no rail.    Total Score 24          10/31/23 THERAPEUTIC EXERCISE: To improve strength, endurance, ROM, and flexibility.  Demonstration, verbal and tactile cues throughout for technique.  NuStep - L5 x 6 min Seated 3-way lumbar flexion stretch with hands on green pball 2 x 30-60 each Seated 3-way lumbar flexion stretch with hands supported on SPC 2 x 30-60 each Bridge + hip ABD isometric with looped GTB at distal thighs 5 x 5 Bridge + hip ABD isometric with mobilization belt at distal thighs 5 x 5  SELF CARE: Provided education to increase independence with ADLs and to facilitate performance of basic household cleaning/chores with decreased  pain. Provided education in proper posture and body mechanics for typical daily positioning and household chores to minimize strain on low back.  Added 1/8 cork to R shoe to achieve better symmetry of B iliac crest height and therefore align spine. Advised patient to utilize for trial - should notice some improvement in overall activity tolerance with standing and walking but if increased or different pain LS region, glutes or LEs, remove the lift.   10/25/23 THERAPEUTIC EXERCISE: To improve strength, endurance, ROM, and flexibility.  Nustep L5x6min Seated lumbar flexion green pball 2x1' each Seated QL stretch B side bend x Supine DKTC with peanut ball x 10  NEUROMUSCULAR RE-EDUCATION: To improve coordination, kinesthesia, posture, and proprioception Seated on dynadisk:  Rows GTB 2x10   Shoulder ext GTB x 15  Pallof press 2x10 GTB TrA + Bridge 2x10 Supine bent knee fallout Blue TB 10x3; 2 sets B sides  10/23/23 Seated lumbar flexion green pball 2x1' each Supine LTRs x10 B, 3-5  Hooklying TrA + Bridges x15 w/ breathing maintained  Supine Marches x20 B w/ cues to maintain breathing  Supine bent knee fallout Blue TB 10x3 B Standing overhead lat stretch at ladder x 1 min each side Standing trunk rotations holding onto ladder x 10 both sides   10/17/23 PHYSICAL PERFORMANCE TEST or MEASUREMENT: Functional Gait  Assessment  Gait assessed  Yes   Gait Level Surface Walks 20 ft, slow speed, abnormal gait pattern, evidence for imbalance or deviates 10-15 in outside of the 12 in walkway width. Requires more than 7 sec to ambulate 20 ft.   Change in Gait Speed Makes only minor adjustments to walking speed, or accomplishes a change in speed with significant gait deviations, deviates 10-15 in outside the 12 in walkway width, or changes speed but loses balance but  is able to recover and continue walking.   Gait with Horizontal Head Turns Performs head turns smoothly with slight change in gait velocity (eg, minor disruption to smooth gait path), deviates 6-10 in outside 12 in walkway width, or uses an assistive device.   Gait with Vertical Head Turns Performs task with moderate change in gait velocity, slows down, deviates 10-15 in outside 12 in walkway width but recovers, can continue to walk.   Gait and Pivot Turn Pivot turns safely in greater than 3 sec and stops with no loss of balance, or pivot turns safely within 3 sec and stops with mild imbalance, requires small steps to catch balance.   Step Over Obstacle Is able to step over one shoe box (4.5 in total height) but must slow down and adjust steps to clear box safely. May require verbal cueing.   Gait with Narrow Base of Support Ambulates less than 4 steps heel to toe or cannot perform without assistance.   Gait with Eyes Closed Walks 20 ft, uses assistive device, slower speed, mild gait deviations, deviates 6-10 in outside 12 in walkway width. Ambulates 20 ft in less than 9 sec but greater than 7 sec.   Ambulating Backwards Walks 20 ft, slow speed, abnormal gait pattern, evidence for imbalance, deviates 10-15 in outside 12 in walkway width.   Steps Alternating feet, must use rail.   Total Score 13      SELF CARE/THER-EX Reviewed pt's initial HEP:  Supine LTRs x10 B, 3-5  Supine SBKTC 2x30 B  Supine HS stretch with strap 2x30  Hooklying Diaphragmatic Breathing x10  Hooklying TrA+ Glute Sets x10 w/ breathing maintained  Hooklying TrA + Bridges x10 w/ breathing maintained  Supine Marches x10 B w/ cues to maintain breathing    10/15/23 SELF CARE:  Reviewed eval findings and role of PT in addressing identified deficits as well as instruction in initial HEP (see below).    PATIENT EDUCATION:  Education details: HEP update  Person educated: Patient Education method: Explanation, Demonstration,  Verbal cues, and Tactile cues Education comprehension: verbalized understanding, returned demonstration, verbal cues required, tactile cues required, and needs further education   HOME EXERCISE PROGRAM: Access Code: [email or web address] URL: [email or web address] Date: 10/31/2023 Prepared by: Elijah Hidden  Exercises - Supine Lower Trunk Rotation  - 1 x daily - 7 x weekly - 1 sets - 5 reps - 30s hold - Supine Hamstring Stretch with Strap  - 1 x daily - 7 x weekly - 3 reps - 30s hold - Hooklying Clamshell with Resistance  - 1 x daily - 7 x  weekly - 3 sets - 10 reps - 3-5 sec hold - Seated Quadratus Lumborum Stretch in Chair  - 1 x daily - 7 x weekly - 3 sets - 3 reps - 30 seconds- 1 min hold - Seated 3 Way Exercise Ball Roll Out Stretch  - 1 x daily - 7 x weekly - 2 sets - 3 reps - 30 sec hold - Supine Bridge with Resistance Band  - 1 x daily - 7 x weekly - 2 sets - 10 reps - 5 sec hold  Patient Education - Posture and Body Mechanics   ASSESSMENT:  CLINICAL IMPRESSION: Pt has shown great improvements with PT, showing improved score on 5xSTS, FGA, and increased LE strength. Pain has greatly improved as well. Although he feels better overall, his modified Oswestry score has declined by 9 points. He is dealing with increased responsibility in personal life, would like to discontinue PT for now for this reason along with him reaching his desired functional level. Will plan for 30 day hold per patient request. Tommy Jackson will benefit from continued skilled PT to address ongoing flexibility/range of motion, abnormal muscle tension, strength and balance deficits to improve mobility and activity tolerance with decreased pain interference and decreased risk for falls.    EVAL: Tommy Jackson is a 68 y/o M referred to PT for evaluation and treatment of low back pain. Tommy Jackson states he has L sided low back pain that has been worsening over the last 6 weeks or so. He is having difficulty with any  moving especially standing. Pt ambulates independently with a significant lateral shift to the R side which remains in sitting as well. Pt says his L lower back is where his pain is so, his positioning is consistent with his pain. Today's assessment revealed BLE weakness and pain, dec'd flexibility, and TTP at the L SIJ with palpable knot or possible plica in the area, states this is his area of pain with periodic numbness to the anterior thigh. Pt is also TTP at the medial knee/pes anserine insertion. Noted inc'd difficulty with bed mobility and transfers as well today. Pt completed 5xSTS in 37.0 sec indicating fall risk at this time, noted that with standing pt has pain so, this slowed down his movement during. Tommy Jackson scored a 29 / 50 on the Modified Oswestry for low back pain, representing severe disability due to his pain. Tommy Jackson will benefit from skilled PT intervention to address deficits found today to improve mobility, function, and activity tolerance with dec'd pain interference and dec'd risk of falls.   OBJECTIVE IMPAIRMENTS: Abnormal gait, decreased activity tolerance, decreased balance, decreased endurance, decreased knowledge of condition, decreased mobility, difficulty walking, decreased ROM, decreased strength, hypomobility, increased fascial restrictions, impaired perceived functional ability, increased muscle spasms, impaired flexibility, improper body mechanics, postural dysfunction, obesity, and pain.   ACTIVITY LIMITATIONS: carrying, lifting, bending, sitting, standing, squatting, stairs, transfers, bed mobility, locomotion level, and caring for others  PARTICIPATION LIMITATIONS: driving, shopping, and community activity  PERSONAL FACTORS: Age, Past/current experiences, Time since onset of injury/illness/exacerbation, and 3+ comorbidities: PMH: arthritis, asthma, DM, hyperlipidemia, HTN, R total hip replacement, R biceps repair, obesity, OSA are also affecting patient's functional outcome.    REHAB POTENTIAL: Good  CLINICAL DECISION MAKING: Evolving/moderate complexity  EVALUATION COMPLEXITY: Moderate   GOALS: Goals reviewed with patient? Yes  SHORT TERM GOALS: Target date: 11/05/2023   Patient will be independent with initial HEP.  Baseline: Goal status: MET - 10/25/23  2.  Patient will report  at least 15-20% improvement in low back pain for improved QoL.  Baseline: 6/10, 8/10 (worst) Goal status: MET - 10/31/23 - Pt reports at least 50-60% improvement in his pain  3.  Patient will be able to tolerate at least 15 minutes of standing without pain interference.  Baseline: per modified Oswestry - I avoid standing because it increases my pain right away.  Goal status: NOT MET - 10/31/23 - Pt reports he is able to stand ~5 min before his pain starts to increase  LONG TERM GOALS: Target date: 12/10/2023   Patient will be independent with advanced HEP.  Baseline:  Goal status: MET - 10/31/23 - HEP reviewed and modified for better tolerance  2.  Patient will report at least 50% improvement in low back pain for improve QoL.  Baseline: 6/10, 8/10 (worst) Goal status: MET - 10/31/2023 - Pt reports at least 50-60% improvement in his pain  3.  Patient will improve his Modified Oswestry score by at least 10 points to decrease level of disability d/t pain.  Baseline: 29/50 Goal status: not met 20 / 50 = 40.0 %  4.  Patient will perform 5xSTS in under 20s to decrease fall risk and improve LE strength.  Baseline: 37s at eval Goal status: met 11/06/23  5.  Patient will increase BLE strength grossly to 4+/5 to improve endurance and activity tolerance.  Baseline: MMT chart above Goal status: partially met- 11/06/23  6. 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  Baseline: 13/30- see above Goal status: met- 11/06/23   PLAN:  PT FREQUENCY: 2x/week  PT DURATION: 6-8 weeks  PLANNED  INTERVENTIONS: 97164- PT Re-evaluation, 97750- Physical Performance Testing, 97110-Therapeutic exercises, 97530- Therapeutic activity, 97112- Neuromuscular re-education, 97535- Self Care, [id number]- Manual therapy, [phone]- Gait training, [phone]- Aquatic Therapy, [phone]- Electrical stimulation (unattended), [id number]- Ultrasound, [id number]- Traction (mechanical), [id number]- Ionotophoresis 4mg /ml Dexamethasone , 79439 (1-2 muscles), 20561 (3+ muscles)- Dry Needling, Patient/Family education, Balance training, Stair training, Taping, Joint mobilization, Spinal mobilization, Cryotherapy, and Moist heat.  PLAN FOR NEXT SESSION: 30 day hold   Marji Kuehnel LITTIE Gaskins, PTA 11/06/2023, 11:04 AM

## 2023-11-09 ENCOUNTER — Encounter: Admitting: Physical Therapy

## 2023-11-12 ENCOUNTER — Other Ambulatory Visit: Payer: Self-pay | Admitting: Adult Health

## 2023-11-12 DIAGNOSIS — S39012A Strain of muscle, fascia and tendon of lower back, initial encounter: Secondary | ICD-10-CM

## 2023-11-19 DIAGNOSIS — H3561 Retinal hemorrhage, right eye: Secondary | ICD-10-CM | POA: Diagnosis not present

## 2023-11-19 DIAGNOSIS — H43819 Vitreous degeneration, unspecified eye: Secondary | ICD-10-CM | POA: Diagnosis not present

## 2023-11-20 DIAGNOSIS — H43813 Vitreous degeneration, bilateral: Secondary | ICD-10-CM | POA: Diagnosis not present

## 2023-11-20 DIAGNOSIS — H2513 Age-related nuclear cataract, bilateral: Secondary | ICD-10-CM | POA: Diagnosis not present

## 2023-12-02 ENCOUNTER — Other Ambulatory Visit: Payer: Self-pay | Admitting: Adult Health

## 2023-12-02 DIAGNOSIS — I10 Essential (primary) hypertension: Secondary | ICD-10-CM

## 2023-12-09 ENCOUNTER — Other Ambulatory Visit: Payer: Self-pay | Admitting: Adult Health

## 2023-12-09 DIAGNOSIS — I10 Essential (primary) hypertension: Secondary | ICD-10-CM

## 2023-12-09 DIAGNOSIS — E785 Hyperlipidemia, unspecified: Secondary | ICD-10-CM

## 2023-12-25 DIAGNOSIS — H43811 Vitreous degeneration, right eye: Secondary | ICD-10-CM | POA: Diagnosis not present

## 2023-12-31 ENCOUNTER — Encounter: Payer: Self-pay | Admitting: Radiology

## 2024-01-16 ENCOUNTER — Encounter: Payer: Self-pay | Admitting: Adult Health

## 2024-01-16 ENCOUNTER — Telehealth: Payer: Self-pay

## 2024-01-16 ENCOUNTER — Ambulatory Visit: Admitting: Adult Health

## 2024-01-16 VITALS — BP 142/82 | HR 79 | Temp 97.9°F | Ht 70.25 in | Wt 310.0 lb

## 2024-01-16 DIAGNOSIS — J45909 Unspecified asthma, uncomplicated: Secondary | ICD-10-CM

## 2024-01-16 DIAGNOSIS — M545 Low back pain, unspecified: Secondary | ICD-10-CM | POA: Diagnosis not present

## 2024-01-16 DIAGNOSIS — E1169 Type 2 diabetes mellitus with other specified complication: Secondary | ICD-10-CM

## 2024-01-16 DIAGNOSIS — E785 Hyperlipidemia, unspecified: Secondary | ICD-10-CM | POA: Diagnosis not present

## 2024-01-16 DIAGNOSIS — Z7985 Long-term (current) use of injectable non-insulin antidiabetic drugs: Secondary | ICD-10-CM | POA: Diagnosis not present

## 2024-01-16 DIAGNOSIS — N400 Enlarged prostate without lower urinary tract symptoms: Secondary | ICD-10-CM

## 2024-01-16 DIAGNOSIS — G8929 Other chronic pain: Secondary | ICD-10-CM | POA: Diagnosis not present

## 2024-01-16 DIAGNOSIS — I10 Essential (primary) hypertension: Secondary | ICD-10-CM | POA: Diagnosis not present

## 2024-01-16 DIAGNOSIS — Z6841 Body Mass Index (BMI) 40.0 and over, adult: Secondary | ICD-10-CM | POA: Diagnosis not present

## 2024-01-16 LAB — COMPREHENSIVE METABOLIC PANEL WITH GFR
ALT: 43 U/L (ref 0–53)
AST: 27 U/L (ref 0–37)
Albumin: 4.9 g/dL (ref 3.5–5.2)
Alkaline Phosphatase: 32 U/L — ABNORMAL LOW (ref 39–117)
BUN: 29 mg/dL — ABNORMAL HIGH (ref 6–23)
CO2: 31 meq/L (ref 19–32)
Calcium: 10 mg/dL (ref 8.4–10.5)
Chloride: 100 meq/L (ref 96–112)
Creatinine, Ser: 1.03 mg/dL (ref 0.40–1.50)
GFR: 75.17 mL/min (ref >60.00)
Glucose, Bld: 124 mg/dL — ABNORMAL HIGH (ref 70–99)
Potassium: 4.1 meq/L (ref 3.5–5.1)
Sodium: 141 meq/L (ref 135–145)
Total Bilirubin: 0.5 mg/dL (ref 0.2–1.2)
Total Protein: 7.6 g/dL (ref 6.0–8.3)

## 2024-01-16 LAB — CBC
HCT: 43.4 % (ref 39.0–52.0)
Hemoglobin: 14.8 g/dL (ref 13.0–17.0)
MCHC: 34.2 g/dL (ref 30.0–36.0)
MCV: 93.1 fl (ref 78.0–100.0)
Platelets: 216 K/uL (ref 150.0–400.0)
RBC: 4.66 Mil/uL (ref 4.22–5.81)
RDW: 12.9 % (ref 11.5–15.5)
WBC: 6.3 K/uL (ref 4.0–10.5)

## 2024-01-16 LAB — TSH: TSH: 2.01 u[IU]/mL (ref 0.35–5.50)

## 2024-01-16 LAB — HEMOGLOBIN A1C: Hgb A1c MFr Bld: 6.3 % (ref 4.6–6.5)

## 2024-01-16 LAB — LIPID PANEL
Cholesterol: 135 mg/dL (ref 0–200)
HDL: 45.3 mg/dL (ref >39.00)
LDL Cholesterol: 49 mg/dL (ref 0–99)
NonHDL: 89.66
Total CHOL/HDL Ratio: 3
Triglycerides: 201 mg/dL — ABNORMAL HIGH (ref 0.0–149.0)
VLDL: 40.2 mg/dL — ABNORMAL HIGH (ref 0.0–40.0)

## 2024-01-16 LAB — MICROALBUMIN / CREATININE URINE RATIO
Creatinine,U: 82.8 mg/dL
Microalb Creat Ratio: 92.7 mg/g — ABNORMAL HIGH (ref 0.0–30.0)
Microalb, Ur: 7.7 mg/dL — ABNORMAL HIGH (ref 0.0–1.9)

## 2024-01-16 LAB — PSA: PSA: 1.08 ng/mL (ref 0.10–4.00)

## 2024-01-16 MED ORDER — HYDROCODONE-ACETAMINOPHEN 5-325 MG PO TABS
1.0000 | ORAL_TABLET | Freq: Four times a day (QID) | ORAL | 0 refills | Status: AC | PRN
Start: 2024-01-16 — End: 2024-01-23

## 2024-01-16 MED ORDER — TRULICITY 3 MG/0.5ML ~~LOC~~ SOAJ: 3.0000 mg | SUBCUTANEOUS | 0 refills | Status: AC

## 2024-01-16 NOTE — Progress Notes (Signed)
   01/16/2024  Patient ID: Tommy Jackson, male   DOB: 1955-06-08, 68 y.o.   MRN: [id number]  Submitted completed application to [facility] for renewal of Trulicity  PAP for 2026 for 3mg , dose change per PCP.  Pending company decision.  Jon VEAR Lindau, PharmD Clinical Pharmacist [phone]

## 2024-01-16 NOTE — Progress Notes (Signed)
 Subjective:    Patient ID: Tommy Jackson, male    DOB: [birth year], 68 y.o.   MRN: [id number]  HPI Patient presents for yearly preventative medicine examination. She is a pleasant 68 year old male who  has a past medical history of Allergy, Arthritis, ASTHMA (09/07/2009), Diabetes mellitus without complication (HCC), Herniated disc, HYPERLIPIDEMIA (02/07/2007), HYPERTENSION (11/29/2005), OBESITY (10/01/2007), Sleep apnea, and SLEEP APNEA, OBSTRUCTIVE (10/01/2007).  DM -he is currently managed with Trulicity  1.5 mg weekly.  He has not had any side effects of this medication.  Does report early satiety has helped him cut back on portion sizes but continues to snack in the late night.   In the past we have trialed him on metformin  but this caused subsequently stopped. Lab Results  Component Value Date   HGBA1C 5.9 06/16/2022   HGBA1C 6.6 (H) 02/15/2022   HGBA1C 6.2 (A) 04/06/2021   Essential Hypertension - prescribed Norvasc  10 mg, Diovan  HCTZ 320-25 mg, Lopressor  100 mg, and Lasix  20 mg.  He denies dizziness, lightheadedness, chest pain, shortness of breath, syncopal episodes. He has not been checking his BP at home. His BP has a tendency to run high in a medical office.  BP Readings from Last 3 Encounters:  01/16/24 (!) 142/82  09/20/23 (!) 154/93  07/29/22 126/82   Hyperlipidemia -prescribed pravastatin  40 mg and fenofibrate  145 mg daily.  He denies myalgia or fatigue Lab Results  Component Value Date   CHOL 135 06/16/2022   HDL 36.50 (L) 06/16/2022   LDLCALC 43 09/16/2020   LDLDIRECT 59.0 06/16/2022   TRIG 275.0 (H) 06/16/2022   CHOLHDL 4 06/16/2022    Asthma -mild and intermittent.  He uses rescue inhaler as needed  Morbid Obesity-  He reports that he does try and eat healthy but falls off the wagon every once in awhile. He has no chronic Wt Readings from Last 10 Encounters:  01/16/24 (!) 310 lb (140.6 kg)  09/20/23 (!) 313 lb 12.8 oz (142.3 kg)  10/02/22 298 lb (135.2 kg)   07/28/22 298 lb 3.2 oz (135.3 kg)  07/17/22 298 lb 3.2 oz (135.3 kg)  06/16/22 300 lb (136.1 kg)  06/01/22 (!) 303 lb (137.4 kg)  04/27/22 (!) 318 lb (144.2 kg)  03/30/22 (!) 322 lb (146.1 kg)  03/15/22 (!) 322 lb (146.1 kg)   BPH - asymptomatic   Chronic Back Pain - he has had epidural injections in the past and they do not help much. He does not want to go through an additional surgery. He mostly deals with the pain but will take Norco occasionally for break through pain. His last refill of in 06/2022.   All immunizations and health maintenance protocols were reviewed with the patient and needed orders were placed.  Appropriate screening laboratory values were ordered for the patient including screening of hyperlipidemia, renal function and hepatic function. If indicated by BPH, a PSA was ordered.  Medication reconciliation,  past medical history, social history, problem list and allergies were reviewed in detail with the patient  Goals were established with regard to weight loss, exercise, and  diet in compliance with medications  He is overdue for colon cancer screening and will call and schedule his procedure.   Review of Systems  Constitutional: Negative.   HENT: Negative.    Eyes: Negative.   Respiratory: Negative.    Cardiovascular: Negative.   Gastrointestinal: Negative.   Endocrine: Negative.   Genitourinary: Negative.   Musculoskeletal:  Positive for arthralgias and back pain.  Skin: Negative.   Allergic/Immunologic: Negative.   Neurological: Negative.   Hematological: Negative.   Psychiatric/Behavioral: Negative.    All other systems reviewed and are negative.  Past Medical History:  Diagnosis Date   Allergy    Arthritis    ASTHMA 09/07/2009   Diabetes mellitus without complication (HCC)    Herniated disc    HYPERLIPIDEMIA 02/07/2007   HYPERTENSION 11/29/2005   OBESITY 10/01/2007   Sleep apnea    cpap   SLEEP APNEA, OBSTRUCTIVE 10/01/2007    Social  History   Socioeconomic History   Marital status: Married    Spouse name: Not on file   Number of children: 3   Years of education: Not on file   Highest education level: 12th grade  Occupational History   Occupation: retired clinical research associate term disability    Employer: RETIRED  Tobacco Use   Smoking status: Never   Smokeless tobacco: Never  Vaping Use   Vaping status: Never Used  Substance and Sexual Activity   Alcohol use: Yes    Alcohol/week: 0.0 standard drinks of alcohol    Comment: rare alcohol intake   Drug use: No   Sexual activity: Not on file  Other Topics Concern   Not on file  Social History Narrative   Not on file   Social Drivers of Health   Financial Resource Strain: Low Risk  (01/13/2024)   Overall Financial Resource Strain (CARDIA)    Difficulty of Paying Living Expenses: Not hard at all  Food Insecurity: No Food Insecurity (01/13/2024)   Hunger Vital Sign    Worried About Running Out of Food in the Last Year: Never true    Ran Out of Food in the Last Year: Never true  Transportation Needs: Unknown (01/13/2024)   PRAPARE - Administrator, Civil Service (Medical): Not on file    Lack of Transportation (Non-Medical): No  Physical Activity: Inactive (01/13/2024)   Exercise Vital Sign    Days of Exercise per Week: 0 days    Minutes of Exercise per Session: Not on file  Stress: No Stress Concern Present (01/13/2024)   [employer] of Occupational Health - Occupational Stress Questionnaire    Feeling of Stress: Not at all  Social Connections: Moderately Integrated (01/13/2024)   Social Connection and Isolation Panel    Frequency of Communication with Friends and Family: More than three times a week    Frequency of Social Gatherings with Friends and Family: More than three times a week    Attends Religious Services: Never    Database Administrator or Organizations: Yes    Attends Banker Meetings: Never    Marital Status:  Married  Catering Manager Violence: Not At Risk (07/28/2022)   Humiliation, Afraid, Rape, and Kick questionnaire    Fear of Current or Ex-Partner: No    Emotionally Abused: No    Physically Abused: No    Sexually Abused: No    Past Surgical History:  Procedure Laterality Date   bicep surgery Right 2014   COLONOSCOPY     ELBOW SURGERY Right 2013   POLYPECTOMY     removal disc neck  2015   SHOULDER SURGERY Right 1990   TOTAL HIP ARTHROPLASTY 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   Procedure: RIGHT TOTAL HIP ARTHROPLASTY ANTERIOR APPROACH;  Surgeon: Vernetta Lonni GRADE, MD;  Location: [facility];  Service: Orthopedics;  Laterality: Right;    Family History  Problem Relation Age of Onset   Colon cancer Father 54  Lung cancer Father    CAD Father    Diabetes Father    Heart attack Father    Colon cancer Brother 28   Heart attack Paternal Grandfather    CAD Brother    Esophageal cancer Neg Hx    Stomach cancer Neg Hx    Rectal cancer Neg Hx     Allergies  Allergen Reactions   Aspirin      REACTION: unspecified   Glycerin Nausea Only   Penicillins     REACTION: family hx   Metformin  And Related Diarrhea    Current Outpatient Medications on File Prior to Visit  Medication Sig Dispense Refill   albuterol  (VENTOLIN  HFA) 108 (90 Base) MCG/ACT inhaler Inhale 2 puffs into the lungs every 4 (four) hours as needed. 6.7 g 3   amLODipine  (NORVASC ) 10 MG tablet TAKE 1 TABLET BY MOUTH EVERY DAY 90 tablet 0   Ascorbic Acid  (VITAMIN C ) 100 MG tablet Take 500 mg by mouth daily.     aspirin  81 MG chewable tablet Chew 1 tablet (81 mg total) by mouth 2 (two) times daily. 30 tablet 0   azelastine  (ASTELIN ) 0.1 % nasal spray Place 2 sprays into both nostrils daily as needed for allergies.     cetirizine (ZYRTEC) 10 MG tablet Take 10 mg by mouth daily as needed for allergies.     cyclobenzaprine  (FLEXERIL ) 10 MG tablet TAKE 1 TABLET BY MOUTH EVERYDAY AT BEDTIME 15 tablet 0   Dulaglutide  (TRULICITY ) 1.5 MG/0.5ML  SOAJ Inject 1.5 mg into the skin once a week. 2 mL 0   fenofibrate  (TRICOR ) 145 MG tablet TAKE 1 TABLET BY MOUTH EVERY DAY 90 tablet 3   fluticasone  (FLONASE ) 50 MCG/ACT nasal spray Place 2 sprays into both nostrils daily as needed for allergies.     furosemide  (LASIX ) 20 MG tablet TAKE 1 TABLET BY MOUTH EVERY DAY 90 tablet 3   KLOR-CON  M10 10 MEQ tablet TAKE 1 TABLET BY MOUTH EVERY DAY 90 tablet 3   meloxicam (MOBIC) 15 MG tablet Take 1 tablet by mouth daily.     metoprolol  tartrate (LOPRESSOR ) 100 MG tablet Take 1 tablet (100 mg total) by mouth 2 (two) times daily. Need annual exam for further refills 180 tablet 0   Multiple Vitamin (MULTIVITAMIN) capsule Take 1 capsule by mouth daily.     pravastatin  (PRAVACHOL ) 40 MG tablet Take 1 tablet (40 mg total) by mouth daily. Need annual exam for further refills 90 tablet 0   valsartan -hydrochlorothiazide  (DIOVAN -HCT) 320-25 MG tablet Take 1 tablet by mouth daily. Need annual exam for further refills 90 tablet 0   No current facility-administered medications on file prior to visit.    BP (!) 142/82   Pulse 79   Temp 97.9 F (36.6 C) (Oral)   Ht 5' 10.25 (1.784 m)   Wt (!) 310 lb (140.6 kg)   SpO2 99%   BMI 44.16 kg/m       Objective:   Physical Exam Vitals and nursing note reviewed.  Constitutional:      General: He is not in acute distress.    Appearance: Normal appearance. He is obese. He is not ill-appearing.  HENT:     Head: Normocephalic and atraumatic.     Right Ear: Tympanic membrane, ear canal and external ear normal. There is no impacted cerumen.     Left Ear: Tympanic membrane, ear canal and external ear normal. There is no impacted cerumen.     Nose: Nose normal. No  congestion or rhinorrhea.     Mouth/Throat:     Mouth: Mucous membranes are moist.     Pharynx: Oropharynx is clear.  Eyes:     Extraocular Movements: Extraocular movements intact.     Conjunctiva/sclera: Conjunctivae normal.     Pupils: Pupils are equal,  round, and reactive to light.  Neck:     Vascular: No carotid bruit.  Cardiovascular:     Rate and Rhythm: Normal rate and regular rhythm.     Pulses: Normal pulses.     Heart sounds: No murmur heard.    No friction rub. No gallop.  Pulmonary:     Effort: Pulmonary effort is normal.     Breath sounds: Normal breath sounds.  Abdominal:     General: Abdomen is flat. Bowel sounds are normal. There is no distension.     Palpations: Abdomen is soft. There is no mass.     Tenderness: There is no abdominal tenderness. There is no guarding or rebound.     Hernia: No hernia is present.  Musculoskeletal:        General: Normal range of motion.     Cervical back: Normal range of motion and neck supple.  Lymphadenopathy:     Cervical: No cervical adenopathy.  Skin:    General: Skin is warm and dry.     Capillary Refill: Capillary refill takes less than 2 seconds.  Neurological:     General: No focal deficit present.     Mental Status: He is alert and oriented to person, place, and time.  Psychiatric:        Mood and Affect: Mood normal.        Behavior: Behavior normal.        Thought Content: Thought content normal.        Judgment: Judgment normal.        Assessment & Plan:  1. Type 2 diabetes mellitus with other specified complication, without long-term current use of insulin (HCC) (Primary) - Consider adding agent - Lipid panel; Future - TSH; Future - CBC; Future - Comprehensive metabolic panel with GFR; Future - Hemoglobin A1c; Future - Microalbumin/Creatinine Ratio, Urine; Future  2. Long-term current use of injectable noninsulin antidiabetic medication - Continue with Trulicity  but will increase to 3 mg weekly.  - Lipid panel; Future - TSH; Future - CBC; Future - Comprehensive metabolic panel with GFR; Future - Hemoglobin A1c; Future - Microalbumin/Creatinine Ratio, Urine; Future  3. Essential hypertension - Slightly elevated in the office. Will have him check at  home and let me know what his readings are via mychart  - Lipid panel; Future - TSH; Future - CBC; Future - Comprehensive metabolic panel with GFR; Future  4. Hyperlipidemia associated with type 2 diabetes mellitus (HCC) - Continue with current therapy  - Lipid panel; Future - TSH; Future - CBC; Future - Comprehensive metabolic panel with GFR; Future  5. Uncomplicated asthma, unspecified asthma severity, unspecified whether persistent - Continue with current therapy  - Lipid panel; Future - TSH; Future - CBC; Future - Comprehensive metabolic panel with GFR; Future  6. Morbid obesity (HCC) - Encouraged weight loss through diet and exercise  - Lipid panel; Future - TSH; Future - CBC; Future - Comprehensive metabolic panel with GFR; Future  7. Benign prostatic hyperplasia without lower urinary tract symptoms  - PSA; Future  8. Chronic midline low back pain without sciatica  - HYDROcodone -acetaminophen  (NORCO/VICODIN) 5-325 MG tablet; Take 1 tablet by mouth every 6 (six)  hours as needed for up to 7 days for moderate pain (pain score 4-6).  Dispense: 20 tablet; Refill: 0   Darleene Shape, NP  I personally spent a total of 40 minutes in the care of the patient today including preparing to see the patient, getting/reviewing separately obtained history, performing a medically appropriate exam/evaluation, counseling and educating, placing orders, and documenting clinical information in the EHR.

## 2024-01-17 ENCOUNTER — Ambulatory Visit: Payer: Self-pay | Admitting: Adult Health

## 2024-01-17 ENCOUNTER — Other Ambulatory Visit: Payer: Self-pay | Admitting: Adult Health

## 2024-01-17 DIAGNOSIS — I10 Essential (primary) hypertension: Secondary | ICD-10-CM

## 2024-01-23 ENCOUNTER — Telehealth: Payer: Self-pay

## 2024-01-23 NOTE — Progress Notes (Signed)
   01/23/2024  Patient ID: Tommy Jackson, male   DOB: [birth year], 68 y.o.   MRN: [id number]  Received notification from [facility] that patient has been APPROVED to continue to receive Trulicity  through 02/26/25.  Jon VEAR Lindau, PharmD Clinical Pharmacist [phone]

## 2024-02-11 ENCOUNTER — Encounter: Payer: Self-pay | Admitting: Gastroenterology

## 2024-03-03 DIAGNOSIS — I10 Essential (primary) hypertension: Secondary | ICD-10-CM

## 2024-03-03 DIAGNOSIS — E785 Hyperlipidemia, unspecified: Secondary | ICD-10-CM

## 2024-03-06 ENCOUNTER — Encounter

## 2024-03-10 ENCOUNTER — Other Ambulatory Visit: Payer: Self-pay | Admitting: Adult Health

## 2024-03-10 NOTE — Telephone Encounter (Unsigned)
 Copied from CRM #[id number]. Topic: Clinical - Medication Refill >> Mar 10, 2024  4:53 PM Jasmin G wrote: Medication: azelastine  (ASTELIN ) 0.1 % nasal spray  Has the patient contacted their pharmacy? Yes (Agent: If no, request that the patient contact the pharmacy for the refill. If patient does not wish to contact the pharmacy document the reason why and proceed with request.) (Agent: If yes, when and what did the pharmacy advise?)  This is the patient's preferred pharmacy:  [id number] - [place], [place] - [address] [place] [address] Phone: [phone] Fax: [phone]  Is this the correct pharmacy for this prescription? Yes If no, delete pharmacy and type the correct one.   Has the prescription been filled recently? No  Is the patient out of the medication? No  Has the patient been seen for an appointment in the last year OR does the patient have an upcoming appointment? Yes  Can we respond through MyChart? No  Agent: Please be advised that Rx refills may take up to 3 business days. We ask that you follow-up with your pharmacy.

## 2024-03-11 ENCOUNTER — Ambulatory Visit

## 2024-03-11 VITALS — Ht 72.0 in | Wt 307.0 lb

## 2024-03-11 DIAGNOSIS — Z800 Family history of malignant neoplasm of digestive organs: Secondary | ICD-10-CM

## 2024-03-11 MED ORDER — AZELASTINE HCL 0.1 % NA SOLN: 2.0000 | Freq: Every day | NASAL | 0 refills | Status: AC | PRN

## 2024-03-11 MED ORDER — NA SULFATE-K SULFATE-MG SULF 17.5-3.13-1.6 GM/177ML PO SOLN: 1.0000 | Freq: Once | ORAL | 0 refills | Status: AC

## 2024-03-11 NOTE — Telephone Encounter (Signed)
 Okay for refill?

## 2024-03-11 NOTE — Progress Notes (Signed)
 PCP MD at time of PV: Darleene Shape, NP  __________________________________________________________________________________________________________________________________________  No egg allergy known to patient  No soy allergy known to patient No issues known to pt with past sedation with any surgeries or procedures Patient denies ever being told they had issues or difficulty with intubation  No FH of Malignant Hyperthermia Pt is not on diet pills Pt is not on  home 02  Pt is not on blood thinners  No A fib or A flutter Have any cardiac testing pending--no  LOA: independent No Chew or Snuff tobacco __________________________________________________________________________________________________________________________________________  Constipation: no Prep: suprep  __________________________________________________________________________________________________________________________________________  PV completed with patient. Prep instructions reviewed and provided during apt. Rx sent to preferred pharmacy.  __________________________________________________________________________________________________________________________________________  Patient's chart reviewed by Norleen Schillings CNRA prior to previsit and patient appropriate for the [facility].  Previsit completed and red dot placed by patient's name on their procedure day (on provider's schedule).

## 2024-03-20 ENCOUNTER — Encounter: Admitting: Gastroenterology

## 2024-03-25 ENCOUNTER — Encounter: Admitting: Gastroenterology

## 2024-04-17 ENCOUNTER — Ambulatory Visit: Admitting: Adult Health

## 2024-04-28 ENCOUNTER — Encounter: Admitting: Gastroenterology
